# Patient Record
Sex: Female | Born: 1965
Health system: Southern US, Community
[De-identification: ages and names within clinical notes are randomized; demographics above are authoritative.]

## PROBLEM LIST (undated history)

## (undated) DIAGNOSIS — R109 Unspecified abdominal pain: Secondary | ICD-10-CM

## (undated) DIAGNOSIS — R32 Unspecified urinary incontinence: Secondary | ICD-10-CM

## (undated) DIAGNOSIS — R011 Cardiac murmur, unspecified: Secondary | ICD-10-CM

## (undated) DIAGNOSIS — M4800 Spinal stenosis, site unspecified: Secondary | ICD-10-CM

## (undated) HISTORY — PX: COLONOSCOPY: SHX174

## (undated) HISTORY — DX: Unspecified abdominal pain: R10.9

## (undated) HISTORY — DX: Spinal stenosis, site unspecified: M48.00

## (undated) HISTORY — PX: HEMORROIDECTOMY: SUR656

## (undated) HISTORY — DX: Cardiac murmur, unspecified: R01.1

## (undated) HISTORY — PX: ESOPHAGOGASTRODUODENOSCOPY: SHX1529

## (undated) HISTORY — DX: Unspecified urinary incontinence: R32

---

## 2015-07-11 ENCOUNTER — Ambulatory Visit: Payer: Self-pay | Attending: Internal Medicine

## 2015-07-27 ENCOUNTER — Ambulatory Visit (INDEPENDENT_AMBULATORY_CARE_PROVIDER_SITE_OTHER): Payer: Self-pay | Admitting: Family Medicine

## 2015-07-27 ENCOUNTER — Encounter: Payer: Self-pay | Admitting: Family Medicine

## 2015-07-27 VITALS — BP 123/55 | HR 76 | Temp 98.2°F | Resp 14 | Ht 60.0 in | Wt 122.0 lb

## 2015-07-27 DIAGNOSIS — R1031 Right lower quadrant pain: Secondary | ICD-10-CM

## 2015-07-27 DIAGNOSIS — Z1239 Encounter for other screening for malignant neoplasm of breast: Secondary | ICD-10-CM

## 2015-07-27 DIAGNOSIS — Z23 Encounter for immunization: Secondary | ICD-10-CM

## 2015-07-27 DIAGNOSIS — E040 Nontoxic diffuse goiter: Secondary | ICD-10-CM

## 2015-07-27 DIAGNOSIS — Z0000 Encounter for general adult medical examination without abnormal findings: Secondary | ICD-10-CM

## 2015-07-27 DIAGNOSIS — G8929 Other chronic pain: Secondary | ICD-10-CM | POA: Insufficient documentation

## 2015-07-27 DIAGNOSIS — E012 Iodine-deficiency related (endemic) goiter, unspecified: Secondary | ICD-10-CM

## 2015-07-27 LAB — COMPLETE METABOLIC PANEL WITH GFR
ALT: 19 U/L (ref 6–29)
AST: 24 U/L (ref 10–35)
Albumin: 4.3 g/dL (ref 3.6–5.1)
Alkaline Phosphatase: 96 U/L (ref 33–115)
BUN: 16 mg/dL (ref 7–25)
CO2: 25 mmol/L (ref 20–31)
Calcium: 9.4 mg/dL (ref 8.6–10.2)
Chloride: 102 mmol/L (ref 98–110)
Creat: 0.74 mg/dL (ref 0.50–1.10)
GFR, Est African American: >89 mL/min (ref >=60)
GFR, Est Non African American: >89 mL/min (ref >=60)
Glucose, Bld: 79 mg/dL (ref 65–99)
Potassium: 4.1 mmol/L (ref 3.5–5.3)
Sodium: 137 mmol/L (ref 135–146)
Total Bilirubin: 0.3 mg/dL (ref 0.2–1.2)
Total Protein: 8 g/dL (ref 6.1–8.1)

## 2015-07-27 LAB — CBC WITH DIFFERENTIAL/PLATELET
Basophils Absolute: 0 10*3/uL (ref 0.0–0.1)
Basophils Relative: 0 % (ref 0–1)
Eosinophils Absolute: 0.1 10*3/uL (ref 0.0–0.7)
Eosinophils Relative: 1 % (ref 0–5)
HCT: 41.4 % (ref 36.0–46.0)
Hemoglobin: 13.8 g/dL (ref 12.0–15.0)
Lymphocytes Relative: 28 % (ref 12–46)
Lymphs Abs: 1.6 10*3/uL (ref 0.7–4.0)
MCH: 28.9 pg (ref 26.0–34.0)
MCHC: 33.3 g/dL (ref 30.0–36.0)
MCV: 86.8 fL (ref 78.0–100.0)
MPV: 9.3 fL (ref 8.6–12.4)
Monocytes Absolute: 0.5 10*3/uL (ref 0.1–1.0)
Monocytes Relative: 8 % (ref 3–12)
Neutro Abs: 3.6 10*3/uL (ref 1.7–7.7)
Neutrophils Relative %: 63 % (ref 43–77)
Platelets: 296 10*3/uL (ref 150–400)
RBC: 4.77 MIL/uL (ref 3.87–5.11)
RDW: 13.3 % (ref 11.5–15.5)
WBC: 5.7 10*3/uL (ref 4.0–10.5)

## 2015-07-27 LAB — POCT URINALYSIS DIP (DEVICE)
Bilirubin Urine: NEGATIVE
Glucose, UA: NEGATIVE mg/dL
Ketones, ur: NEGATIVE mg/dL
Leukocytes, UA: NEGATIVE
Nitrite: NEGATIVE
Protein, ur: NEGATIVE mg/dL
Specific Gravity, Urine: 1.015 (ref 1.005–1.030)
Urobilinogen, UA: 0.2 mg/dL (ref 0.0–1.0)
pH: 6.5 (ref 5.0–8.0)

## 2015-07-27 LAB — LIPASE: Lipase: 59 U/L (ref 7–60)

## 2015-07-27 LAB — AMYLASE: Amylase: 77 U/L (ref 0–105)

## 2015-07-27 NOTE — Progress Notes (Signed)
Subjective:    Patient ID: Maria HooverHuyen Garrison, female    DOB: 08/13/1966, 49 y.o.   MRN: [id number]  HPI [name], a 49 year old female presents accompanied by an interpreter to establish care. She states that she moved from [place] 1 year ago. Patient primarily speaks vietnamese, interpreter is utilized to assist with communication. She has not had a primary provider since moving her. She is currently complaining of right upper abdominal pain for 2 weeks.  The pain is described as intermitent and is 4/10 in intensity. Symptoms have been intermittent since. Aggravating factors: include lying down.  Shje has not attempted any OTC interventions to alleviate symptoms. She denies fatigue, fever, early satiety, heartburn, belching, urinary problems, muscle aches, nausea, vomiting, and diarrhea  . History reviewed. No pertinent past medical history.   Social History   Social History  . Marital Status: Married    Spouse Name: N/A  . Number of Children: N/A  . Years of Education: N/A   Occupational History  . Not on file.   Social History Main Topics  . Smoking status: Never Smoker   . Smokeless tobacco: Not on file  . Alcohol Use: No  . Drug Use: No  . Sexual Activity: Not on file   Other Topics Concern  . Not on file   Social History Narrative  . No narrative on file   Immunization History  Administered Date(s) Administered  . Influenza,inj,Quad PF,36+ Mos 07/27/2015  . Tdap 07/27/2015    Review of Systems  Constitutional: Negative.  Negative for fever, fatigue and unexpected weight change.  HENT: Negative.   Eyes: Negative.  Negative for photophobia, redness and visual disturbance.  Cardiovascular: Negative for chest pain, palpitations and leg swelling.  Gastrointestinal: Positive for abdominal pain. Negative for nausea, vomiting, diarrhea, constipation, blood in stool, abdominal distention and rectal pain.       Patient reports abdominal pain and occasional soft stools   Endocrine: Negative.  Negative for polydipsia, polyphagia and polyuria.  Genitourinary: Negative.   Musculoskeletal: Negative.   Skin: Negative.   Allergic/Immunologic: Negative.  Negative for immunocompromised state.  Neurological: Negative.  Negative for dizziness, tremors, weakness and headaches.  Hematological: Negative.   Psychiatric/Behavioral: Negative.  Negative for suicidal ideas and sleep disturbance.       Objective:   Physical Exam  Constitutional: She is oriented to person, place, and time. She appears well-developed and well-nourished.  HENT:  Head: Normocephalic and atraumatic.  Right Ear: External ear normal.  Left Ear: External ear normal.  Nose: Nose normal.  Mouth/Throat: Oropharynx is clear and moist.  Eyes: Conjunctivae and EOM are normal. Pupils are equal, round, and reactive to light.  Neck: Normal range of motion. Neck supple.  Cardiovascular: Normal rate, regular rhythm, normal heart sounds and intact distal pulses.   Pulmonary/Chest: Effort normal and breath sounds normal.  Abdominal: Soft. Normal appearance and bowel sounds are normal. There is tenderness in the right upper quadrant and left upper quadrant.  Musculoskeletal: Normal range of motion.  Neurological: She is alert and oriented to person, place, and time.  Skin: Skin is warm and dry.  Psychiatric: She has a normal mood and affect. Her behavior is normal. Judgment and thought content normal.      BP 123/55 mmHg  Pulse 76  Temp(Src) 98.2 F (36.8 C) (Oral)  Resp 14  Ht 5' (1.524 m)  Wt 122 lb (55.339 kg)  BMI 23.83 kg/m2  LMP 07/11/2015 Assessment & Plan:  1. Abdominal pain, chronic, right lower quadrant Urinalysis negative. Will review labs as they become available. Recommended that patient follow a low fat diet and increase water intake.  - POCT urinalysis dipstick - COMPLETE METABOLIC PANEL WITH GFR - CBC with Differential - Lipase - Amylase  2. Need for immunization against  influenza - Flu Vaccine QUAD 36+ mos IM (Fluarix)  3. Need for Tdap vaccination  - Tdap vaccine greater than or equal to 7yo IM  4. Routine health maintenance Will schedule follow up for pap smear Will send referral for mammogram Recommend monthly self-breast exams as discussed - Multiple Vitamins-Minerals (MULTIVITAMIN WITH MINERALS) tablet; Take 1 tablet by mouth daily. 5. Goiter, simple Palpated over thyroid, fullness noted. Will check TSH.  - TSH  6. Screening for breast cancer * - MM Digital Screening; Future  RTC: as needed  Massie Maroon, FNP

## 2015-07-28 LAB — TSH: TSH: 1.258 u[IU]/mL (ref 0.350–4.500)

## 2015-07-28 NOTE — Patient Instructions (Signed)
  Recommend low fat clear liquid diet for 24 hours, then introduce bland soft diet. Abdominal Pain, Adult Many things can cause abdominal pain. Usually, abdominal pain is not caused by a disease and will improve without treatment. It can often be observed and treated at home. Your health care provider will do a physical exam and possibly order blood tests and X-rays to help determine the seriousness of your pain. However, in many cases, more time must pass before a clear cause of the pain can be found. Before that point, your health care provider may not know if you need more testing or further treatment. HOME CARE INSTRUCTIONS Monitor your abdominal pain for any changes. The following actions may help to alleviate any discomfort you are experiencing:  Only take over-the-counter or prescription medicines as directed by your health care provider.  Do not take laxatives unless directed to do so by your health care provider.  Try a clear liquid diet (broth, tea, or water) as directed by your health care provider. Slowly move to a bland diet as tolerated. SEEK MEDICAL CARE IF:  You have unexplained abdominal pain.  You have abdominal pain associated with nausea or diarrhea.  You have pain when you urinate or have a bowel movement.  You experience abdominal pain that wakes you in the night.  You have abdominal pain that is worsened or improved by eating food.  You have abdominal pain that is worsened with eating fatty foods.  You have a fever. SEEK IMMEDIATE MEDICAL CARE IF:  Your pain does not go away within 2 hours.  You keep throwing up (vomiting).  Your pain is felt only in portions of the abdomen, such as the right side or the left lower portion of the abdomen.  You pass bloody or black tarry stools. MAKE SURE YOU:  Understand these instructions.  Will watch your condition.  Will get help right away if you are not doing well or get worse.   This information is not intended  to replace advice given to you by your health care provider. Make sure you discuss any questions you have with your health care provider.   Document Released: 07/03/2005 Document Revised: 06/14/2015 Document Reviewed: 06/02/2013 Elsevier Interactive Patient Education [employer]2016 Elsevier Inc.

## 2015-09-21 ENCOUNTER — Ambulatory Visit: Payer: Self-pay | Admitting: Family Medicine

## 2015-10-04 ENCOUNTER — Other Ambulatory Visit (HOSPITAL_COMMUNITY)
Admission: RE | Admit: 2015-10-04 | Discharge: 2015-10-04 | Disposition: A | Discharge status: Home / Self Care | Payer: Self-pay | Source: Ambulatory Visit | Attending: Internal Medicine | Admitting: Internal Medicine

## 2015-10-04 ENCOUNTER — Encounter: Payer: Self-pay | Admitting: Family Medicine

## 2015-10-04 ENCOUNTER — Ambulatory Visit (INDEPENDENT_AMBULATORY_CARE_PROVIDER_SITE_OTHER): Payer: Self-pay | Admitting: Family Medicine

## 2015-10-04 VITALS — BP 109/67 | HR 72 | Temp 98.1°F | Resp 14 | Ht 60.0 in | Wt 122.0 lb

## 2015-10-04 DIAGNOSIS — N941 Unspecified dyspareunia: Secondary | ICD-10-CM | POA: Insufficient documentation

## 2015-10-04 DIAGNOSIS — Z789 Other specified health status: Secondary | ICD-10-CM

## 2015-10-04 DIAGNOSIS — R102 Pelvic and perineal pain: Secondary | ICD-10-CM

## 2015-10-04 DIAGNOSIS — G8929 Other chronic pain: Secondary | ICD-10-CM

## 2015-10-04 DIAGNOSIS — R1031 Right lower quadrant pain: Secondary | ICD-10-CM

## 2015-10-04 DIAGNOSIS — Z01419 Encounter for gynecological examination (general) (routine) without abnormal findings: Secondary | ICD-10-CM | POA: Insufficient documentation

## 2015-10-04 LAB — POCT URINALYSIS DIP (DEVICE)
Bilirubin Urine: NEGATIVE
Glucose, UA: NEGATIVE mg/dL
Ketones, ur: NEGATIVE mg/dL
Leukocytes, UA: NEGATIVE
Nitrite: NEGATIVE
Protein, ur: NEGATIVE mg/dL
Specific Gravity, Urine: 1.02 (ref 1.005–1.030)
Urobilinogen, UA: 0.2 mg/dL (ref 0.0–1.0)
pH: 7 (ref 5.0–8.0)

## 2015-10-04 NOTE — Patient Instructions (Addendum)
Will schedule pelvic/transvaginal ultrasound. Also, will follow up by phone when results are available.   Pelvic Pain, Female Female pelvic pain can be caused by many different things and start from a variety of places. Pelvic pain refers to pain that is located in the lower half of the abdomen and between your hips. The pain may occur over a short period of time (acute) or may be reoccurring (chronic). The cause of pelvic pain may be related to disorders affecting the female reproductive organs (gynecologic), but it may also be related to the bladder, kidney stones, an intestinal complication, or muscle or skeletal problems. Getting help right away for pelvic pain is important, especially if there has been severe, sharp, or a sudden onset of unusual pain. It is also important to get help right away because some types of pelvic pain can be life threatening.  CAUSES  Below are only some of the causes of pelvic pain. The causes of pelvic pain can be in one of several categories.   Gynecologic.  Pelvic inflammatory disease.  Sexually transmitted infection.  Ovarian cyst or a twisted ovarian ligament (ovarian torsion).  Uterine lining that grows outside the uterus (endometriosis).  Fibroids, cysts, or tumors.  Ovulation.  Pregnancy.  Pregnancy that occurs outside the uterus (ectopic pregnancy).  Miscarriage.  Labor.  Abruption of the placenta or ruptured uterus.  Infection.  Uterine infection (endometritis).  Bladder infection.  Diverticulitis.  Miscarriage related to a uterine infection (septic abortion).  Bladder.  Inflammation of the bladder (cystitis).  Kidney stone(s).  Gastrointestinal.  Constipation.  Diverticulitis.  Neurologic.  Trauma.  Feeling pelvic pain because of mental or emotional causes (psychosomatic).  Cancers of the bowel or pelvis. EVALUATION  Your caregiver will want to take a careful history of your concerns. This includes recent changes  in your health, a careful gynecologic history of your periods (menses), and a sexual history. Obtaining your family history and medical history is also important. Your caregiver may suggest a pelvic exam. A pelvic exam will help identify the location and severity of the pain. It also helps in the evaluation of which organ system may be involved. In order to identify the cause of the pelvic pain and be properly treated, your caregiver may order tests. These tests may include:   A pregnancy test.  Pelvic ultrasonography.  An X-ray exam of the abdomen.  A urinalysis or evaluation of vaginal discharge.  Blood tests. HOME CARE INSTRUCTIONS   Only take over-the-counter or prescription medicines for pain, discomfort, or fever as directed by your caregiver.   Rest as directed by your caregiver.   Eat a balanced diet.   Drink enough fluids to make your urine clear or pale yellow, or as directed.   Avoid sexual intercourse if it causes pain.   Apply warm or cold compresses to the lower abdomen depending on which one helps the pain.   Avoid stressful situations.   Keep a journal of your pelvic pain. Write down when it started, where the pain is located, and if there are things that seem to be associated with the pain, such as food or your menstrual cycle.  Follow up with your caregiver as directed.  SEEK MEDICAL CARE IF:  Your medicine does not help your pain.  You have abnormal vaginal discharge. SEEK IMMEDIATE MEDICAL CARE IF:   You have heavy bleeding from the vagina.   Your pelvic pain increases.   You feel light-headed or faint.   You have chills.  You have pain with urination or blood in your urine.   You have uncontrolled diarrhea or vomiting.   You have a fever or persistent symptoms for more than 3 days.  You have a fever and your symptoms suddenly get worse.   You are being physically or sexually abused.   This information is not intended to  replace advice given to you by your health care provider. Make sure you discuss any questions you have with your health care provider.   Document Released: 08/20/2004 Document Revised: 06/14/2015 Document Reviewed: 01/13/2012 Elsevier Interactive Patient Education [employer]

## 2015-10-04 NOTE — Progress Notes (Signed)
Subjective:    Patient ID: Maria Garrison, female    DOB: 08/13/1966, 49 y.o.   MRN: [id number]  HPI [name], a 49 year old female presents accompanied by [place] interpreter with a complaint of abdominal pain. She states that abdominal pain has been occuring intermittently over the past several years. She states that she has been having pelvic discomfort with sexual intercourse. She denies fever, fatigue, abnormal vaginal bleeding, vaginal discharge, constipation, nausea, vomiting, or diarrhea. Maria Garrison states that her last pap smear was in [place] many years ago. She has 2 children, both vaginal births. She denies a history of sexually transmitted diseases.  Past Medical History  Diagnosis Date  . Abdominal pain in female     For 3-4 years    Social History   Social History  . Marital Status: Married    Spouse Name: N/A  . Number of Children: N/A  . Years of Education: N/A   Occupational History  . Not on file.   Social History Main Topics  . Smoking status: Never Smoker   . Smokeless tobacco: Not on file  . Alcohol Use: No  . Drug Use: No  . Sexual Activity: Not on file   Other Topics Concern  . Not on file   Social History Narrative   Review of Systems  Constitutional: Negative.   HENT: Negative.   Eyes: Negative.   Respiratory: Negative.   Cardiovascular: Negative.   Gastrointestinal: Positive for abdominal pain.  Endocrine: Negative.  Negative for polydipsia and polyphagia.  Genitourinary: Positive for flank pain, pelvic pain and dyspareunia. Negative for hematuria, decreased urine volume, vaginal bleeding and vaginal pain.  Musculoskeletal: Negative.   Skin: Negative.   Allergic/Immunologic: Negative.  Negative for immunocompromised state.  Neurological: Negative.  Negative for headaches.  Hematological: Negative.   Psychiatric/Behavioral: Negative.        Objective:   Physical Exam  Constitutional: She is oriented to person, place, and time. She  appears well-developed and well-nourished.  HENT:  Head: Normocephalic and atraumatic.  Right Ear: External ear normal.  Left Ear: External ear normal.  Mouth/Throat: Oropharynx is clear and moist.  Eyes: Conjunctivae and EOM are normal. Pupils are equal, round, and reactive to light.  Neck: Normal range of motion. Neck supple.  Cardiovascular: Normal rate, regular rhythm, normal heart sounds and intact distal pulses.   Pulmonary/Chest: Effort normal and breath sounds normal.  Abdominal: Soft. Bowel sounds are normal. There is tenderness.  Genitourinary: Uterus is deviated. Uterus is not tender. Cervix exhibits discharge and friability. Right adnexum displays no tenderness. Left adnexum displays no tenderness. Vaginal discharge found.    Neurological: She is alert and oriented to person, place, and time. She has normal reflexes.  Skin: Skin is warm and dry.  Psychiatric: She has a normal mood and affect. Her behavior is normal. Judgment and thought content normal.     BP 109/67 mmHg  Pulse 72  Temp(Src) 98.1 F (36.7 C) (Oral)  Resp 14  Ht 5' (1.524 m)  Wt 122 lb (55.339 kg)  BMI 23.83 kg/m2 Assessment & Plan:  1. Pelvic pain in female Patient is complaining of pelvic pain, primary following sexual intercourse. Mild CMT on vaginal examination. Will send for pelvic/transvaginal ultrasound.  - US Pelvis Complete; Future - US Transvaginal Non-OB; Future  2. Dyspareunia in female - POCT urinalysis dip (device) - US Pelvis Complete; Future - US Transvaginal Non-OB; Future  3. Abdominal pain, chronic, right lower quadrant Reviewed previous laboratory  values, within normal limits. Urinalysis unremarkable.  - POCT urinalysis dip (device)  4. Encounter for routine gynecological examination Normal rugae, cervix friable, mild CVA tenderness - Cytology - PAP [place]  5. Language barrier to communication Utilized interpreter to assist with communication   RTC: Will follow up  by phone as results become available. Will schedule follow up appointment as needed.    The patient was given clear instructions to go to ER or return to medical center if symptoms do not improve, worsen or new problems develop. The patient verbalized understanding. Will notify patient with laboratory results.   Massie MaroonHollis,Lachina M, FNP

## 2015-10-06 LAB — CYTOLOGY - PAP

## 2015-10-11 ENCOUNTER — Ambulatory Visit (HOSPITAL_COMMUNITY): Admission: RE | Admit: 2015-10-11 | Payer: Self-pay | Source: Ambulatory Visit

## 2015-10-18 ENCOUNTER — Ambulatory Visit (HOSPITAL_COMMUNITY)
Admission: RE | Admit: 2015-10-18 | Discharge: 2015-10-18 | Disposition: A | Discharge status: Home / Self Care | Payer: Self-pay | Source: Ambulatory Visit | Attending: Family Medicine | Admitting: Family Medicine

## 2015-10-18 ENCOUNTER — Ambulatory Visit (HOSPITAL_COMMUNITY): Payer: Self-pay

## 2015-10-18 DIAGNOSIS — N941 Unspecified dyspareunia: Secondary | ICD-10-CM | POA: Insufficient documentation

## 2015-10-18 DIAGNOSIS — R102 Pelvic and perineal pain: Secondary | ICD-10-CM | POA: Insufficient documentation

## 2015-10-20 ENCOUNTER — Other Ambulatory Visit: Payer: Self-pay | Admitting: Family Medicine

## 2015-10-20 DIAGNOSIS — N941 Unspecified dyspareunia: Secondary | ICD-10-CM

## 2015-10-20 DIAGNOSIS — R102 Pelvic and perineal pain: Secondary | ICD-10-CM

## 2015-10-20 NOTE — Progress Notes (Signed)
Called and left message for patient to return call. Thanks.

## 2015-10-20 NOTE — Progress Notes (Signed)
Reviewed pelvic/transvaginal ultrasound: Probable right corpus luteum cyst. Patient is complaining of infertility and dyspareunia as well. Will send a referral to gynecology for further evaluation.    Massie MaroonHollis,Aizen Duval M, FNP

## 2015-10-23 NOTE — Progress Notes (Signed)
Used interpreter services ID [id number] to call patient. Husband answered and states he is at work and wife is at home and asked for us to call back later. Will try in the afternoon.  Thanks!

## 2015-10-24 ENCOUNTER — Other Ambulatory Visit: Payer: Self-pay | Admitting: Family Medicine

## 2015-10-24 DIAGNOSIS — R102 Pelvic and perineal pain: Secondary | ICD-10-CM

## 2015-10-24 DIAGNOSIS — N941 Unspecified dyspareunia: Secondary | ICD-10-CM

## 2015-10-24 NOTE — Progress Notes (Signed)
Have been unable to speak to patient after several attempts, I will send letter in mail regarding lab results and that patient will be sent to gynecology. Thanks!

## 2015-12-11 ENCOUNTER — Encounter: Payer: Self-pay | Admitting: Obstetrics & Gynecology

## 2015-12-11 ENCOUNTER — Ambulatory Visit (INDEPENDENT_AMBULATORY_CARE_PROVIDER_SITE_OTHER): Payer: Self-pay | Admitting: Obstetrics & Gynecology

## 2015-12-11 VITALS — BP 121/73 | HR 79 | Temp 98.4°F | Ht 61.0 in | Wt 125.1 lb

## 2015-12-11 DIAGNOSIS — Z113 Encounter for screening for infections with a predominantly sexual mode of transmission: Secondary | ICD-10-CM

## 2015-12-11 DIAGNOSIS — R102 Pelvic and perineal pain: Secondary | ICD-10-CM

## 2015-12-11 DIAGNOSIS — N941 Unspecified dyspareunia: Secondary | ICD-10-CM

## 2015-12-11 LAB — POCT URINALYSIS DIP (DEVICE)
Bilirubin Urine: NEGATIVE
Bilirubin Urine: NEGATIVE
Glucose, UA: NEGATIVE mg/dL
Glucose, UA: NEGATIVE mg/dL
Ketones, ur: NEGATIVE mg/dL
Ketones, ur: NEGATIVE mg/dL
Leukocytes, UA: NEGATIVE
Leukocytes, UA: NEGATIVE
Nitrite: NEGATIVE
Nitrite: NEGATIVE
Protein, ur: NEGATIVE mg/dL
Protein, ur: NEGATIVE mg/dL
Specific Gravity, Urine: <=1.005 (ref 1.005–1.030)
Specific Gravity, Urine: 1.01 (ref 1.005–1.030)
Urobilinogen, UA: 0.2 mg/dL (ref 0.0–1.0)
Urobilinogen, UA: 0.2 mg/dL (ref 0.0–1.0)
pH: 5 (ref 5.0–8.0)
pH: 6 (ref 5.0–8.0)

## 2015-12-11 NOTE — Progress Notes (Signed)
Patient ID: Maria Garrison, female   DOB: [name], 50 y.o.   MRN: [id number]  Chief Complaint  Patient presents with  . Pelvic Pain  for several years  HPI Maria Garrison is a 50 y.o. female.  [id number] Patient's last menstrual period was 11/19/2015. Regular menses, normal per patient. Referred by FP. Prominent urinary symptoms and dyspareunia, LLQ pain and SP pain.  HPI  Past Medical History  Diagnosis Date  . Abdominal pain in female     For 3-4 years    Past Surgical History  Procedure Laterality Date  . Hemorroidectomy      Family History  Problem Relation Age of Onset  . Family history unknown: Yes    Social History Social History  Substance Use Topics  . Smoking status: Never Smoker   . Smokeless tobacco: None  . Alcohol Use: No    No Known Allergies  Current Outpatient Prescriptions  Medication Sig Dispense Refill  . Multiple Vitamins-Minerals (MULTIVITAMIN WITH MINERALS) tablet Take 1 tablet by mouth daily.     No current facility-administered medications for this visit.    Review of Systems Review of Systems  Constitutional: Negative.   Respiratory: Negative.   Gastrointestinal: Positive for abdominal pain.  Genitourinary: Positive for urgency, frequency, vaginal discharge, pelvic pain and dyspareunia. Negative for flank pain and vaginal bleeding.    Blood pressure 121/73, pulse 79, temperature 98.4 F (36.9 C), temperature source Oral, height 5\' 1"  (1.549 m), weight 125 lb 1.6 oz (56.745 kg), last menstrual period 11/19/2015.  Physical Exam Physical Exam  Constitutional: She is oriented to person, place, and time. She appears well-developed. No distress.  Pulmonary/Chest: Effort normal.  Genitourinary: Vagina normal. No vaginal discharge found.  No mass, mild tenderness mostly s/p  Neurological: She is alert and oriented to person, place, and time.  Psychiatric: She has a normal mood and affect. Her behavior is normal.    Data  Reviewed urinalysis  Assessment    Patient Active Problem List   Diagnosis Date Noted  . Pelvic pain in female 10/04/2015  . Dyspareunia in female 10/04/2015  . Encounter for routine gynecological examination 10/04/2015  . Abdominal pain, chronic, right lower quadrant 07/27/2015   Suspect interstitial cystitis    Plan    Urine culture STD probe result Urology consult        Maria Garrison 12/11/2015, 2:34 PM

## 2015-12-11 NOTE — Progress Notes (Signed)
Language Resources Lottie MusselKim O'Leary  [facility] appt scheduled for April 10th @ 1000.  Pt notified to bring $250 to the appt.

## 2015-12-11 NOTE — Patient Instructions (Signed)

## 2015-12-12 LAB — GC/CHLAMYDIA PROBE AMP (~~LOC~~) NOT AT ARMC
Chlamydia: NEGATIVE
Neisseria Gonorrhea: NEGATIVE

## 2015-12-13 LAB — URINE CULTURE: Colony Count: 3000

## 2016-01-15 ENCOUNTER — Ambulatory Visit: Payer: Self-pay | Attending: Internal Medicine

## 2016-02-19 ENCOUNTER — Ambulatory Visit: Payer: Self-pay | Attending: Internal Medicine

## 2017-02-06 ENCOUNTER — Ambulatory Visit (INDEPENDENT_AMBULATORY_CARE_PROVIDER_SITE_OTHER): Payer: Self-pay | Admitting: Physician Assistant

## 2017-02-21 ENCOUNTER — Ambulatory Visit (INDEPENDENT_AMBULATORY_CARE_PROVIDER_SITE_OTHER): Payer: Self-pay | Admitting: Physician Assistant

## 2017-03-13 ENCOUNTER — Other Ambulatory Visit (HOSPITAL_COMMUNITY)
Admission: RE | Admit: 2017-03-13 | Discharge: 2017-03-13 | Disposition: A | Discharge status: Home / Self Care | Payer: Self-pay | Source: Ambulatory Visit | Attending: Physician Assistant | Admitting: Physician Assistant

## 2017-03-13 ENCOUNTER — Ambulatory Visit (INDEPENDENT_AMBULATORY_CARE_PROVIDER_SITE_OTHER): Payer: Self-pay | Admitting: Physician Assistant

## 2017-03-13 ENCOUNTER — Encounter (INDEPENDENT_AMBULATORY_CARE_PROVIDER_SITE_OTHER): Payer: Self-pay | Admitting: Physician Assistant

## 2017-03-13 VITALS — BP 110/68 | HR 63 | Temp 98.3°F | Ht 60.5 in | Wt 117.6 lb

## 2017-03-13 DIAGNOSIS — N941 Unspecified dyspareunia: Secondary | ICD-10-CM

## 2017-03-13 DIAGNOSIS — K5900 Constipation, unspecified: Secondary | ICD-10-CM

## 2017-03-13 DIAGNOSIS — N390 Urinary tract infection, site not specified: Secondary | ICD-10-CM | POA: Insufficient documentation

## 2017-03-13 DIAGNOSIS — R1032 Left lower quadrant pain: Secondary | ICD-10-CM

## 2017-03-13 DIAGNOSIS — R1011 Right upper quadrant pain: Secondary | ICD-10-CM

## 2017-03-13 DIAGNOSIS — R8281 Pyuria: Secondary | ICD-10-CM

## 2017-03-13 LAB — POCT URINALYSIS DIPSTICK
Bilirubin, UA: NEGATIVE
Glucose, UA: NEGATIVE
Ketones, UA: NEGATIVE
Leukocytes, UA: NEGATIVE
Nitrite, UA: NEGATIVE
Protein, UA: NEGATIVE
Spec Grav, UA: 1.015 (ref 1.010–1.025)
Urobilinogen, UA: 0.2 E.U./dL
pH, UA: 7.5 (ref 5.0–8.0)

## 2017-03-13 LAB — POCT URINE PREGNANCY: Preg Test, Ur: NEGATIVE

## 2017-03-13 MED ORDER — SENNOSIDES-DOCUSATE SODIUM 8.6-50 MG PO TABS: 1.0000 | ORAL_TABLET | Freq: Every day | ORAL | 0 refills | Status: DC

## 2017-03-13 MED ORDER — CIPROFLOXACIN HCL 500 MG PO TABS: 500.0000 mg | ORAL_TABLET | Freq: Two times a day (BID) | ORAL | 0 refills | Status: AC

## 2017-03-13 MED FILL — CIPROFLOXACIN HCL 500 MG TA: 500 | 5 days supply | Qty: 6 | Fill #0

## 2017-03-13 NOTE — Progress Notes (Signed)
Subjective:  Patient ID: Maria HooverHuyen Randon, female    DOB: Jul 04, 1966  Age: 51 y.o. MRN: [id number]  CC: dysuria  HPI Zahniya Mennella is a 51 y.o. female with no significant PMH presents with LLQ pain, RUQ pain, blood in stool/tissue, and constipation. Thinks she has hemorrhoid. Has remote hx of hemorrhoid surgery in [place]. States he has dyspareunia, metrorrhagia. Denies menorrhagia, vaginal discharge, or genital lesions. Does not endorse CP, palpitations, SOB, HA, fatigue, rash, dysuria, urinary frequency, back pain, f/c/n/v.       ROS Review of Systems  Constitutional: Negative for chills, fever and malaise/fatigue.  Eyes: Negative for blurred vision.  Respiratory: Negative for shortness of breath.   Cardiovascular: Negative for chest pain and palpitations.  Gastrointestinal: Positive for abdominal pain, blood in stool and constipation. Negative for diarrhea, nausea and vomiting.  Genitourinary: Positive for flank pain. Negative for dysuria, frequency and hematuria.       Dyspareunia  Musculoskeletal: Negative for joint pain and myalgias.  Skin: Negative for rash.  Neurological: Negative for tingling and headaches.  Psychiatric/Behavioral: Negative for depression. The patient is not nervous/anxious.     Objective:  BP 110/68 (BP Location: Right Arm, Patient Position: Sitting, Cuff Size: Normal)   Pulse 63   Temp 98.3 F (36.8 C) (Oral)   Ht 5' 0.5" (1.537 m)   Wt 117 lb 9.6 oz (53.3 kg)   LMP 03/10/2017 (Approximate)   SpO2 99%   BMI 22.59 kg/m   BP/Weight 03/13/2017 12/11/2015 10/04/2015  Systolic BP 110 121 109  Diastolic BP 68 73 67  Wt. (Lbs) 117.6 125.1 122  BMI 22.59 23.65 23.83      Physical Exam  Constitutional: She is oriented to person, place, and time.  Well developed, well nourished, NAD, polite  HENT:  Head: Normocephalic and atraumatic.  Eyes: No scleral icterus.  Neck: Normal range of motion. Neck supple. No thyromegaly present.  Cardiovascular: Normal rate,  regular rhythm and normal heart sounds.   Pulmonary/Chest: Effort normal and breath sounds normal.  Abdominal: Soft. Bowel sounds are normal. There is no tenderness.  Genitourinary:  Genitourinary Comments: Vulva normal, vagina shallow, small nabothian cyst on cervix, no cervical motion tenderness, no adnexal masses or tenderness. Mild uterine TTP on left side with mild enlargement on left side.  Musculoskeletal: She exhibits no edema.  Neurological: She is alert and oriented to person, place, and time.  Skin: Skin is warm and dry. No rash noted. No erythema. No pallor.  Psychiatric: She has a normal mood and affect. Her behavior is normal. Thought content normal.  Vitals reviewed.    Assessment & Plan:   1. Left lower quadrant pain - Urinalysis Dipstick with trace leukocyte in clinic today - POCT urine pregnancy negative    2. RUQ pain - CMP - CBC  3. Constipation, unspecified constipation type - Begin senna-docusate (SENOKOT-S) 8.6-50 MG tablet; Take 1 tablet by mouth at bedtime.  Dispense: 30 tablet; Refill: 0  3. Pyuria - Urine cx - Begin Cipro 500 mg one tab BID x3 days.  4. Dyspareunia - Cytology - PAP [place] - US pelvic and transvaginal    Meds ordered this encounter  Medications  . senna-docusate (SENOKOT-S) 8.6-50 MG tablet    Sig: Take 1 tablet by mouth at bedtime.    Dispense:  30 tablet    Refill:  0    Order Specific Question:   Supervising Provider    Answer:   Quentin AngstJEGEDE, OLUGBEMIGA E [id number]    Follow-up:  Return in about 4 weeks (around 04/10/2017) for LLQ pain.   Loletta Specter PA

## 2017-03-13 NOTE — Patient Instructions (Addendum)
To bn, Ng??i l?n (Constipation, Adult) To bn l khi m?t ng??i ?i ??i ti?n trong m?t tu?n t h?n so v?i bnh th??ng, ??i ti?n kh kh?n, ho?c ??i ti?n ra phn kh, c?ng, ho?c to h?n bnh th??ng. To bn c th? do m?t tnh tr?ng bn trong gy ra. N c th? tr? ln tr?m tr?ng h?n theo ?? tu?i n?u m?t ng??i dng cc lo?i thu?c nh?t ??nh v khng u?ng ?? n??c. H??NG D?N CH?M [place] T?I [place] ?n v u?ng  ?n th?c ?n c nhi?u ch?t x? nh? tri cy t??i v rau, ng? c?c nguyn h?t v cc lo?i ??u.  H?n ch? th?c ?n c nhi?u ch?t bo, t ch?t x?, ho?c ch? bi?n s?n qu m?c, ch?ng h?n khoai ty chin, bnh hamburger, bnh quy, k?o v soda.  U?ng ?? n??c ?? gi? cho n??c ti?u trong ho?c c mu vng nh?t. H??ng d?n chung  T?p th? d?c th??ng xuyn theo ch? d?n c?a chuyn gia ch?m [place] s?c kh?e.  ?i v? sinh ngay khi qu v? c nhu c?u. Khng nh?n ?i ??i ti?n.  Ch? s? d?ng thu?c khng c?n k ??n v thu?c c?n k ??n theo ch? d?n c?a chuyn gia ch?m [place] s?c kh?e. Cc thu?c ny bao g?m b?t k? th?c ph?m ch?c n?ng c ch?t x? no.  Th?c hnh cc bi t?p luy?n l?i c? ?y ch?u, ch?ng h?n nh? th? su trong lc th? gin b?ng d??i v th? gin ph?n ?y ch?u trong khi ??i ti?n.  Theo di tnh tr?ng c?a qu v? ?? pht hi?n b?t k? thay ??i no.  Tun th? t?t c? cc cu?c h?n khm l?i theo ch? d?n c?a chuyn gia ch?m [place] s?c kh?e. ?i?u ny c vai tr quan tr?ng. ?I [facility] N?U:  Qu v? b? ?au va? ?au tr?m tr?ng h?n.  Qu v? b? s?t.  Qu v? khng ?i ??i ti?n sau 4 ngy.  Qu v? nn.  Qu v? khng ?i.  Qu v? b? s?t cn.  Qu v? b? ch?y mu ? h?u mn.  Phn c?a qu v? m?ng, trng nh? bt ch.  NGAY L?P T?C ?I K[facility] N?U:  Qu v? b? s?t v cc tri?u ch?ng c?a qu v? ??t nhin tr?m tr?ng h?n.  Qu v? sn phn ho?c c mu trong phn.  B?ng c?a qu v? b? ch??ng ln.  Qu v? b? ?au r?t nhi?u ? b?ng.  Qu v? c?m th?y chng m?t ho?c ng?t x?u.  Thng tin ny khng nh?m m?c ?ch thay th? cho l?i khuyn m chuyn gia  ch?m  s?c kh?e ni v?i qu v?. Hy b?o ??m qu v? ph?i th?o lu?n b?t k? v?n ?? g m qu v? c v?i chuyn gia ch?m  s?c kh?e c?a qu v?. Document Released: 01/08/2011 Document Revised: 10/14/2014 Document Reviewed: 03/13/2016 Elsevier Interactive Patient Education  2017 Elsevier Inc.  Dyspareunia, Female Dyspareunia is pain that is associated with sexual activity. This can affect any part of the genitals or lower abdomen, and there are many possible causes. This condition ranges from mild to severe. Depending on the cause, dyspareunia may get better with treatment, or it may return (recur) over time. What are the causes? The cause of this condition is not always known. Possible causes include:  Cancer.  Psychological factors, such as depression, anxiety, or previous traumatic experiences.  Severe pain and tenderness of the skin around the vagina (vulva) when it is touched (vulvar vestibulitis syndrome).  Infection of the pelvis  or the vulva.  Infection of the vagina.  Painful, involuntary tightening (contraction) of the vaginal muscles when anything is put inside the vagina (vaginismus).  Allergic reaction.  Ovarian cysts.  Solid growths of tissue (tumors) in the ovaries or the uterus.  Scar tissue in the ovaries, vagina, or pelvis.  Vaginal dryness.  Thinning of the tissue (atrophy) of the vulva and vagina.  Skin conditions that affect the vulva (vulvar dermatoses), such as lichen sclerosus or lichen planus.  Endometriosis.  Tubal pregnancy.  A tilted uterus.  Uterine prolapse.  Adhesions in the vagina.  Bladder problems.  Intestinal problems.  Certain medicines.  Medical conditions such as diabetes, arthritis, or thyroid disease.  What increases the risk? The following factors may make you more likely to develop this condition:  Having experienced physical or sexual trauma.  Having given birth more than once.  Taking birth control pills.  Having  gone through menopause.  Having recently given birth, typically within the past 3-6 months.  Breastfeeding.  What are the signs or symptoms? The main symptom of this condition is pain in any part of the genitals or lower abdomen during or after sexual activity. This may include pain during sexual arousal, genital stimulation, or orgasm. Pain may get worse when anything is inserted into the vagina, or when the genitals are touched in any way, such as when sitting or wearing pants. Pain can range from mild to severe, depending on the cause of the condition. In some cases, symptoms go away with treatment and return (recur) at a later date. How is this diagnosed? This condition may be diagnosed based on:  Your symptoms, including: ? Where your pain is located. ? When your pain occurs.  Your medical history.  A physical exam. This may include a pelvic exam and a Pap test. This is a screening test that is used to check for signs of cancer of the vagina, cervix, and uterus.  Tests, including: ? Blood tests. ? Ultrasound. This uses sound waves to make a picture of the area that is being tested. ? Urine culture. This test involves checking a urine sample for signs of infection. ? Culture test. This is when your health care provider uses a swab to collect a sample of vaginal fluid. The sample is checked for signs of infection. ? X-rays. ? MRI. ? CT scan. ? Laparoscopy. This is a procedure in which a small incision is made in your lower abdomen and a lighted, pencil-sized instrument (laparoscope) is passed through the incision and used to look inside your pelvis.  You may be referred to a health care provider who specializes in women's health (gynecologist). In some cases, diagnosing the cause of dyspareunia can be difficult. How is this treated? Treatment depends on the cause of your condition and your symptoms. In most cases, you may need to stop sexual activity until your symptoms improve.  Treatment may include:  Lubricants.  Kegel exercises or vaginal dilators.  Medicated skin creams.  Medicated vaginal creams.  Hormonal therapy.  Antibiotic medicine to prevent or fight infection.  Medicines that help to relieve pain.  Medicines that treat depression (antidepressants).  Psychological counseling.  Sex therapy.  Surgery.  Follow these instructions at home: Lifestyle  Avoid tight clothing and irritating materials around your genital and abdominal area.  Use water-based lubricants as needed. Avoid oil-based lubricants.  Do not use any products that irritate you. This may include certain condoms, spermicides, lubricants, soaps, tampons, vaginal sprays, or douches.  Always practice safe sex. Talk with your health care provider about which form of birth control (contraception) is best for you.  Maintain open communication with your sexual partner. General instructions  Take over-the-counter and prescription medicines only as told by your health care provider.  If you had tests done, it is your responsibility to get your tests results. Ask your health care provider or the department performing the test when your results will be ready.  Urinate before you engage in sexual activity.  Consider joining a support group.  Keep all follow-up visits as told by your health care provider. This is important. Contact a health care provider if:  You develop vaginal bleeding after sexual intercourse.  You develop a lump at the opening of your vagina. Seek medical care even if the lump is painless.  You have: ? Abnormal vaginal discharge. ? Vaginal dryness. ? Itchiness or irritation of your vulva or vagina. ? A new rash. ? Symptoms that get worse or do not improve with treatment. ? A fever. ? Pain when you urinate. ? Blood in your urine. Get help right away if:  You develop severe pain in your abdomen during or shortly after sexual intercourse.  You pass out  after having sexual intercourse. This information is not intended to replace advice given to you by your health care provider. Make sure you discuss any questions you have with your health care provider. Document Released: 10/13/2007 Document Revised: 02/02/2016 Document Reviewed: 04/25/2015 Elsevier Interactive Patient Education  [employer].

## 2017-03-14 LAB — COMPREHENSIVE METABOLIC PANEL
ALT: 13 IU/L (ref 0–32)
AST: 23 IU/L (ref 0–40)
Albumin/Globulin Ratio: 1.5 (ref 1.2–2.2)
Albumin: 4.5 g/dL (ref 3.5–5.5)
Alkaline Phosphatase: 86 IU/L (ref 39–117)
BUN/Creatinine Ratio: 22 (ref 9–23)
BUN: 15 mg/dL (ref 6–24)
Bilirubin Total: 0.3 mg/dL (ref 0.0–1.2)
CO2: 24 mmol/L (ref 18–29)
Calcium: 9.2 mg/dL (ref 8.7–10.2)
Chloride: 101 mmol/L (ref 96–106)
Creatinine, Ser: 0.68 mg/dL (ref 0.57–1.00)
GFR calc Af Amer: 120 mL/min/{1.73_m2} (ref >59)
GFR calc non Af Amer: 104 mL/min/{1.73_m2} (ref >59)
Globulin, Total: 3 g/dL (ref 1.5–4.5)
Glucose: 82 mg/dL (ref 65–99)
Potassium: 4 mmol/L (ref 3.5–5.2)
Sodium: 139 mmol/L (ref 134–144)
Total Protein: 7.5 g/dL (ref 6.0–8.5)

## 2017-03-14 LAB — CBC WITH DIFFERENTIAL/PLATELET
Basophils Absolute: 0 10*3/uL (ref 0.0–0.2)
Basos: 1 %
EOS (ABSOLUTE): 0.1 10*3/uL (ref 0.0–0.4)
Eos: 1 %
Hematocrit: 41.5 % (ref 34.0–46.6)
Hemoglobin: 13.2 g/dL (ref 11.1–15.9)
Immature Grans (Abs): 0 10*3/uL (ref 0.0–0.1)
Immature Granulocytes: 0 %
Lymphocytes Absolute: 1.6 10*3/uL (ref 0.7–3.1)
Lymphs: 30 %
MCH: 28.3 pg (ref 26.6–33.0)
MCHC: 31.8 g/dL (ref 31.5–35.7)
MCV: 89 fL (ref 79–97)
Monocytes Absolute: 0.4 10*3/uL (ref 0.1–0.9)
Monocytes: 8 %
Neutrophils Absolute: 3.1 10*3/uL (ref 1.4–7.0)
Neutrophils: 60 %
Platelets: 282 10*3/uL (ref 150–379)
RBC: 4.67 x10E6/uL (ref 3.77–5.28)
RDW: 13.7 % (ref 12.3–15.4)
WBC: 5.2 10*3/uL (ref 3.4–10.8)

## 2017-03-15 LAB — URINE CULTURE

## 2017-03-17 ENCOUNTER — Other Ambulatory Visit (INDEPENDENT_AMBULATORY_CARE_PROVIDER_SITE_OTHER): Payer: Self-pay | Admitting: Physician Assistant

## 2017-03-17 ENCOUNTER — Ambulatory Visit (HOSPITAL_COMMUNITY)
Admission: RE | Admit: 2017-03-17 | Discharge: 2017-03-17 | Disposition: A | Discharge status: Home / Self Care | Payer: Self-pay | Source: Ambulatory Visit | Attending: Physician Assistant | Admitting: Physician Assistant

## 2017-03-17 DIAGNOSIS — B379 Candidiasis, unspecified: Secondary | ICD-10-CM

## 2017-03-17 DIAGNOSIS — N83202 Unspecified ovarian cyst, left side: Secondary | ICD-10-CM | POA: Insufficient documentation

## 2017-03-17 DIAGNOSIS — N941 Unspecified dyspareunia: Secondary | ICD-10-CM | POA: Insufficient documentation

## 2017-03-17 LAB — CYTOLOGY - PAP
Bacterial vaginitis: NEGATIVE
Candida vaginitis: POSITIVE — AB
Chlamydia: NEGATIVE
Diagnosis: NEGATIVE
Neisseria Gonorrhea: NEGATIVE
Trichomonas: NEGATIVE

## 2017-03-17 MED ORDER — FLUCONAZOLE 150 MG PO TABS: 150.0000 mg | ORAL_TABLET | Freq: Once | ORAL | 0 refills | Status: AC

## 2017-03-17 NOTE — Progress Notes (Signed)
PAP cytology revealed candida.

## 2017-03-20 ENCOUNTER — Encounter (INDEPENDENT_AMBULATORY_CARE_PROVIDER_SITE_OTHER): Payer: Self-pay

## 2017-04-10 ENCOUNTER — Ambulatory Visit (INDEPENDENT_AMBULATORY_CARE_PROVIDER_SITE_OTHER): Payer: Self-pay | Admitting: Physician Assistant

## 2017-04-10 ENCOUNTER — Encounter (INDEPENDENT_AMBULATORY_CARE_PROVIDER_SITE_OTHER): Payer: Self-pay | Admitting: Physician Assistant

## 2017-04-10 VITALS — BP 104/67 | HR 66 | Temp 98.2°F | Wt 119.0 lb

## 2017-04-10 DIAGNOSIS — R110 Nausea: Secondary | ICD-10-CM

## 2017-04-10 DIAGNOSIS — N83202 Unspecified ovarian cyst, left side: Secondary | ICD-10-CM

## 2017-04-10 DIAGNOSIS — R1011 Right upper quadrant pain: Secondary | ICD-10-CM

## 2017-04-10 LAB — POCT URINE PREGNANCY: Preg Test, Ur: NEGATIVE

## 2017-04-10 MED ORDER — ONDANSETRON HCL 4 MG PO TABS: 4.0000 mg | ORAL_TABLET | Freq: Three times a day (TID) | ORAL | 0 refills | Status: DC | PRN

## 2017-04-10 MED ORDER — NAPROXEN 500 MG PO TBEC: 500.0000 mg | DELAYED_RELEASE_TABLET | Freq: Two times a day (BID) | ORAL | 0 refills | Status: DC

## 2017-04-10 MED ORDER — OMEPRAZOLE 40 MG PO CPDR: 40.0000 mg | DELAYED_RELEASE_CAPSULE | Freq: Every day | ORAL | 3 refills | Status: DC

## 2017-04-10 MED FILL — NAPROXEN 500 MG TABLET: 500 | 7 days supply | Qty: 14 | Fill #0

## 2017-04-10 MED FILL — ?ONDANSETRON HCL 4 MG TABLE: 4 | 6 days supply | Qty: 20 | Fill #0

## 2017-04-10 MED FILL — OMEPRAZOLE DR 40 MG CAPSULE: 40 | 30 days supply | Qty: 30 | Fill #0

## 2017-04-10 NOTE — Progress Notes (Signed)
Subjective:  Patient ID: Maria Garrison, female    DOB: 1965/12/16  Age: 51 y.o. MRN: [id number]  CC: abdominal pain   HPI Maria Garrison is a 51 y.o. female with a recent US finding of hemorrhagic cyst in left ovary presents with continued LLQ pain and RUQ pain. She had an ultrasound pelvic and transvaginal on 03/17/17 revealed complex hypoechoic cystic lesion left ovary may represent a hemorrhagic cyst. Short-interval follow up ultrasound in 6-12 weeks is recommended, preferably during the week following the patient's normal menses. States he has dyspareunia, metrorrhagia. Denies menorrhagia, vaginal discharge, or genital lesions. Does not endorse CP, palpitations, SOB, HA, BRBPR, hematochezia, hematemesis, fatigue, rash, dysuria, urinary frequency, back pain, f/c/n/v.  * Information obtained through use of audio interpretation services.  Outpatient Medications Prior to Visit  Medication Sig Dispense Refill  . Multiple Vitamins-Minerals (MULTIVITAMIN WITH MINERALS) tablet Take 1 tablet by mouth daily.    Marland Kitchen. senna-docusate (SENOKOT-S) 8.6-50 MG tablet Take 1 tablet by mouth at bedtime. 30 tablet 0   No facility-administered medications prior to visit.      ROS Review of Systems  Constitutional: Negative for chills, fever and malaise/fatigue.  Eyes: Negative for blurred vision.  Respiratory: Negative for shortness of breath.   Cardiovascular: Negative for chest pain and palpitations.  Gastrointestinal: Positive for abdominal pain. Negative for nausea.  Genitourinary: Negative for dysuria and hematuria.  Musculoskeletal: Negative for joint pain and myalgias.  Skin: Negative for rash.  Neurological: Negative for tingling and headaches.  Psychiatric/Behavioral: Negative for depression. The patient is not nervous/anxious.     Objective:  BP 104/67 (BP Location: Left Arm, Patient Position: Sitting, Cuff Size: Normal)   Pulse 66   Temp 98.2 F (36.8 C) (Oral)   Wt 119 lb (54 kg)   LMP  03/13/2017 (Exact Date)   SpO2 100%   BMI 22.86 kg/m   BP/Weight 04/10/2017 03/13/2017 12/11/2015  Systolic BP 104 110 121  Diastolic BP 67 68 73  Wt. (Lbs) 119 117.6 125.1  BMI 22.86 22.59 23.65      Physical Exam  Constitutional: She is oriented to person, place, and time.  Well developed, well nourished, NAD, polite  HENT:  Head: Normocephalic and atraumatic.  Eyes: No scleral icterus.  Cardiovascular: Normal rate, regular rhythm and normal heart sounds.   Pulmonary/Chest: Effort normal and breath sounds normal.  Abdominal: Soft. Bowel sounds are normal. There is tenderness (Mild RUQ and epigastric TTP).  Genitourinary:  Genitourinary Comments: Mild left adnexal TTP  Musculoskeletal: She exhibits no edema.  Neurological: She is alert and oriented to person, place, and time. No cranial nerve deficit. Coordination normal.  Skin: Skin is warm and dry. No rash noted. No erythema. No pallor.  Psychiatric: She has a normal mood and affect. Her behavior is normal. Thought content normal.  Vitals reviewed.    Assessment & Plan:   1. Hemorrhagic cyst of left ovary - Begin naproxen (EC NAPROSYN) 500 MG EC tablet; Take 1 tablet (500 mg total) by mouth 2 (two) times daily with a meal.  Dispense: 14 tablet; Refill: 0 - US Pelvis Complete; Future - US Transvaginal Non-OB; Future  2. Abdominal pain, RUQ - Begin naproxen (EC NAPROSYN) 500 MG EC tablet; Take 1 tablet (500 mg total) by mouth 2 (two) times daily with a meal.  Dispense: 14 tablet; Refill: 0 - H. pylori antibody, IgG - Begin omeprazole (PRILOSEC) 40 MG capsule; Take 1 capsule (40 mg total) by mouth daily.  Dispense: 30 capsule; Refill:  3 - POCT urine pregnancy - Lipase - US Abdomen Limited RUQ; Future  3. Nausea without vomiting - Begin ondansetron (ZOFRAN) 4 MG tablet; Take 1 tablet (4 mg total) by mouth every 8 (eight) hours as needed for nausea or vomiting.  Dispense: 20 tablet; Refill: 0   Meds ordered this encounter   Medications  . naproxen (EC NAPROSYN) 500 MG EC tablet    Sig: Take 1 tablet (500 mg total) by mouth 2 (two) times daily with a meal.    Dispense:  14 tablet    Refill:  0    Order Specific Question:   Supervising Provider    Answer:   Quentin Angst [id number]  . ondansetron (ZOFRAN) 4 MG tablet    Sig: Take 1 tablet (4 mg total) by mouth every 8 (eight) hours as needed for nausea or vomiting.    Dispense:  20 tablet    Refill:  0    Order Specific Question:   Supervising Provider    Answer:   Quentin Angst [id number]  . omeprazole (PRILOSEC) 40 MG capsule    Sig: Take 1 capsule (40 mg total) by mouth daily.    Dispense:  30 capsule    Refill:  3    Order Specific Question:   Supervising Provider    Answer:   Quentin Angst [[id number]]    Follow-up: 4 weeks  Loletta Specter PA

## 2017-04-10 NOTE — Patient Instructions (Signed)
?  au b?ng, Ng??i l?n  Abdominal Pain, Adult  Nhi?u nguyên nhân có th? gây ra ?au b?ng. ?au b?ng th??ng không nghiêm tr?ng và ?? mà không c?n ph?i ?i?u tr? ho?c khi ???c ?i?u tr? t?i nhà. Tuy nhiên, ?ôi khi ?au b?ng là nghiêm tr?ng. Chuyên gia ch?m sóc s?c kh?e c?a quý v? s? khai thác b?nh s? và khám th?c th? ?? xác ??nh nguyên nhân gây ra ?au b?ng.  Tuân th? nh?ng h??ng d?n này ? nhà:  · Ch? s? d?ng thu?c không kê ??n và thu?c kê ??n theo ch? d?n c?a chuyên gia ch?m sóc s?c kh?e. Không dùng thu?c nhu?n tràng tr? khi ???c chuyên gia ch?m sóc s?c kh?e c?a quý v? ch? d?n.  · U?ng ?? n??c ?? gi? cho n??c ti?u trong ho?c có màu vàng nh?t.  · Theo dõi tình tr?ng c?a quý v? ?? phát hi?n b?t k? thay ??i nào.  · Tuân th? t?t c? các cu?c h?n khám l?i theo ch? d?n c?a chuyên gia ch?m sóc s?c kh?e. ?i?u này có vai trò quan tr?ng.  Hãy liên l?c v?i chuyên gia ch?m sóc s?c kh?e n?u:  · Tình tr?ng ?au b?ng c?a quý v? thay ??i ho?c tr?m tr?ng h?n.  · Quý v? không ?ói ho?c quý v? b? s?t cân mà không c? g?ng gi?m cân.  · Quý v? b? táo bón ho?c b? tiêu ch?y quá 2-3 ngày.  · Quý v? b? ?au khi ?i ti?u ho?c ?i ngoài.  · ?au b?ng khi?n quý v? t?nh gi?c vào ban ?êm.  · ?au tr? nên tr?m tr?ng h?n khi ?n, sau khi ?n, ho?c khi ?n m?t s? th?c ph?m nh?t ??nh.  · Quý v? nôn và không th? kìm l?i ???c.  · Quý v? b? s?t.  Yêu c?u tr? giúp ngay l?p t?c n?u:  · C?n ?au c?a quý v? không h?t s?m theo nh? d? ki?n c?a chuyên gia ch?m sóc s?c kh?e c?a quý v?.  · Quý v? v?n ti?p t?c b? nôn.  · Ch? c?m th?y ?au ? các vùng c?a b?ng, ch?ng h?n nh? ? ph?n b?ng d??i bên ph?i ho?c bên trái.  · Quy? vi? ?i ngoài phân có ma?u ho??c phân ma?u ?en, ho??c phân trông nh? h??c i?n.  · Quý v? b? ?au r?t nhi?u, co th?t ho??c ch???ng b?ng.  · Quý v? có các d?u hi?u m?t n??c, ch?ng h?n nh?:  ? N??c ti?u s?m màu, r?t ít n??c ti?u, ho?c không có n??c ti?u.  ? Môi n?t n?.  ? Mi?ng khô.  ? M?t tr?ng.  ? Bu?n ng?.  ? Y?u.  Thông tin này không nh?m m?c ?ích thay th? cho l?i  khuyên mà chuyên gia ch?m sóc s?c kh?e nói v?i quý v?. Hãy b?o ??m quý v? ph?i th?o lu?n b?t k? v?n ?? gì mà quý v? có v?i chuyên gia ch?m sóc s?c kh?e c?a quý v?.  Document Released: 09/23/2005 Document Revised: 09/12/2016 Document Reviewed: 03/06/2016  Elsevier Interactive Patient Education © 2017 Elsevier Inc.

## 2017-04-11 LAB — H. PYLORI ANTIBODY, IGG: H. pylori, IgG AbS: <0.8 Index Value (ref 0.00–0.79)

## 2017-04-11 LAB — LIPASE: Lipase: 58 U/L (ref 14–72)

## 2017-04-23 ENCOUNTER — Ambulatory Visit (HOSPITAL_COMMUNITY)
Admission: RE | Admit: 2017-04-23 | Discharge: 2017-04-23 | Disposition: A | Discharge status: Home / Self Care | Payer: Self-pay | Source: Ambulatory Visit | Attending: Physician Assistant | Admitting: Physician Assistant

## 2017-04-23 DIAGNOSIS — R1011 Right upper quadrant pain: Secondary | ICD-10-CM | POA: Insufficient documentation

## 2017-05-08 ENCOUNTER — Encounter (INDEPENDENT_AMBULATORY_CARE_PROVIDER_SITE_OTHER): Payer: Self-pay | Admitting: Physician Assistant

## 2017-05-08 ENCOUNTER — Ambulatory Visit (INDEPENDENT_AMBULATORY_CARE_PROVIDER_SITE_OTHER): Payer: Self-pay | Admitting: Physician Assistant

## 2017-05-08 VITALS — BP 113/69 | HR 73 | Temp 98.0°F | Wt 118.2 lb

## 2017-05-08 DIAGNOSIS — N83202 Unspecified ovarian cyst, left side: Secondary | ICD-10-CM

## 2017-05-08 DIAGNOSIS — K641 Second degree hemorrhoids: Secondary | ICD-10-CM

## 2017-05-08 DIAGNOSIS — R1031 Right lower quadrant pain: Secondary | ICD-10-CM

## 2017-05-08 MED ORDER — POLYETHYLENE GLYCOL 3350 17 GM/SCOOP PO POWD: 17.0000 g | Freq: Every day | ORAL | 1 refills | Status: DC

## 2017-05-08 MED ORDER — SENNOSIDES-DOCUSATE SODIUM 8.6-50 MG PO TABS: 1.0000 | ORAL_TABLET | Freq: Every day | ORAL | 0 refills | Status: DC

## 2017-05-08 MED ORDER — HYDROCORTISONE ACETATE 25 MG RE SUPP: 25.0000 mg | Freq: Two times a day (BID) | RECTAL | 2 refills | Status: DC

## 2017-05-08 MED FILL — HYDROCORTISONE AC 25 MG SUP: 25 | 7 days supply | Qty: 14 | Fill #0

## 2017-05-08 MED FILL — POLYETHYLENE GLYCOL 3350 PO: 30 days supply | Qty: 510 | Fill #0

## 2017-05-08 NOTE — Patient Instructions (Addendum)
?au b?ng, Ng??i l?n Abdominal Pain, Adult Nhi?u nguyn nhn c th? gy ra ?au b?ng. ?au b?ng th??ng khng nghim tr?ng v ?? m khng c?n ph?i ?i?u tr? ho?c khi ???c ?i?u tr? t?i nh. Tuy nhin, ?i khi ?au b?ng l nghim tr?ng. Chuyn gia ch?m [place] s?c kh?e c?a qu v? s? khai thc b?nh s? v khm th?c th? ?? xc ??nh nguyn nhn gy ra ?au b?ng. Tun th? nh?ng h??ng d?n ny ? nh:  Ch? s? d?ng thu?c khng k ??n v thu?c k ??n theo ch? d?n c?a chuyn gia ch?m [place] s?c kh?e. Khng dng thu?c nhu?n trng tr? khi ???c chuyn gia ch?m [place] s?c kh?e c?a qu v? ch? d?n.  U?ng ?? n??c ?? gi? cho n??c ti?u trong ho?c c mu vng nh?t.  Theo di tnh tr?ng c?a qu v? ?? pht hi?n b?t k? thay ??i no.  Tun th? t?t c? cc cu?c h?n khm l?i theo ch? d?n c?a chuyn gia ch?m [place] s?c kh?e. ?i?u ny c vai tr quan tr?ng. Hy lin l?c v?i chuyn gia ch?m [place] s?c kh?e n?u:  Tnh tr?ng ?au b?ng c?a qu v? thay ??i ho?c tr?m tr?ng h?n.  Qu v? khng ?i ho?c qu v? b? s?t cn m khng c? g?ng gi?m cn.  Qu v? b? to bn ho?c b? tiu ch?y qu 2-3 ngy.  Qu v? b? ?au khi ?i ti?u ho?c ?i ngoi.  ?au b?ng khi?n qu v? t?nh gi?c vo ban ?m.  ?au tr? nn tr?m tr?ng h?n khi ?n, sau khi ?n, ho?c khi ?n m?t s? th?c ph?m nh?t ??nh.  Qu v? nn v khng th? km l?i ???c.  Qu v? b? s?t. Yu c?u tr? gip ngay l?p t?c n?u:  C?n ?au c?a qu v? khng h?t s?m theo nh? d? ki?n c?a chuyn gia ch?m [place] s?c kh?e c?a qu v?.  Qu v? v?n ti?p t?c b? nn.  Ch? c?m th?y ?au ? cc vng c?a b?ng, ch?ng h?n nh? ? ph?n b?ng d??i bn ph?i ho?c bn tri.  Quy? vi? ?i ngoi phn c ma?u ho??c phn ma?u ?en, ho??c phn trng nh? h??c i?n.  Qu v? b? ?au r?t nhi?u, co th?t ho??c ch???ng b?ng.  Qu v? c cc d?u hi?u m?t n??c, ch?ng h?n nh?: ? N??c ti?u s?m mu, r?t t n??c ti?u, ho?c khng c n??c ti?u. ? Mi n?t n?. ? Mi?ng kh. ? M?t tr?ng. ? Bu?n ng?. ? Y?u. Thng tin ny khng nh?m m?c ?ch thay th? cho l?i  khuyn m chuyn gia ch?m [place] s?c kh?e ni v?i qu v?. Hy b?o ??m qu v? ph?i th?o lu?n b?t k? v?n ?? g m qu v? c v?i chuyn gia ch?m  s?c kh?e c?a qu v?. Document Released: 09/23/2005 Document Revised: 09/12/2016 Document Reviewed: 03/06/2016 Elsevier Interactive Patient Education  2017 Elsevier Inc. U nang bu?ng tr?ng (Ovarian Cyst) U nang bu?ng tr?ng l m?t ti ch?a ch?t l?ng hnh thnh trn bu?ng tr?ng. Bu?ng tr?ng l c? quan nh? s?n sinh ra tr?ng ? n? gi?i. C nhi?u lo?i u nang c th? hnh thnh trn cc bu?ng tr?ng. M?t s? u nang gy ra tri?u ch?ng v c?n ?i?u tr?Marland Kitchen. H?u h?t cc u nang bu?ng tr?ng ??u t? h?t, khng ph?i ung th? (l lnh tnh) v khng gy ra cc v?n ??. Nh?ng lo?i u nang ph? bi?n bao g?m:  U nang (nang) ch?c n?ng. ? X?y ra trong k? kinh nguy?t v th??ng bi?n  m?t khi c k? kinh nguy?t ti?p theo n?u qu v? khng c [place]. ? Th??ng khng gy ra tri?u ch?ng.  L?c n?i m?c t? cung. ? L cc u nang hnh thnh t? m lt t? cung (n?i m?c t? cung). ? ?i khi c?ng ???c g?i l "u nang s-c-la" v chng ch?a ??y mu chuy?n thnh mu nu. ? C th? gy ?au ? b?ng d??i trong khi giao h?p v trong k? kinh nguy?t.  U nang tuy?n. ? Pht tri?n t? cc t? bo trn b? m?t bn ngoi c?a bu?ng tr?ng. ? C th? tr? nn r?t to v lm cho b?ng d??i ?au v ?au khi giao h?p. ? C th? gy ?au d? d?i n?u chng xo?n l?i ho?c v? ra (v?).  U nang b. ? ?i khi ???c pht hi?n th?y ? c? hai bu?ng tr?ng. ? C th? ch?a cc lo?i m khc nhau c?a c? th?, ch?ng h?n nh? da, r?ng, tc, ho?c s?n. ? Th??ng khng gy ra tri?u ch?ng tr? khi cc u nang ny tr? nn r?t to.  U nang hong th?. ? Xu?t hi?n khi c qu nhi?u hooc-mn nh?t ??nh (human chorionic gonadotropin) ???c t?o ra v kch thch cc bu?ng tr?ng qu m?c ?? s?n sinh tr?ng. ? Ph? bi?n nh?t sau khi th?c hi?n cc th? thu?t dng ?? h? tr? vi?c th? [place] (th? [place] trong ?ng nghi?m). NGUYN NHN Nguyn nhn gy ra u nang bu?ng tr?ng c th?  l:  H?i ch?ng kch thch bu?ng tr?ng qu m?c. ?y l tnh tr?ng c th? pht sinh t? vi?c dng thu?c h? tr? th? [place]. Thu?c lm cho hnh thnh nhi?u u nang bu?ng tr?ng l?n.  H?i ch??ng bu?ng tr??ng ?a nang (PCOS). ?y l r?i lo?n hoocmn ph? bi?n c th? gy ra u nang bu?ng tr?ng, c?ng nh? cc v?n ?? v? chu k? kinh nguy?t ho?c kh? n?ng th? [place] c?a qu v?. CC Y?U T? NGUY C? Nh?ng y?u t? sau c th? lm qu v? d? b? u nang bu?ng tr?ng h?n:  Th?a cn ho?c bo ph.  Dng thu?c h? tr? th? [place].  S? d?ng m?t s? d?ng trnh [place] n?i ti?t t?.  Ht thu?c. TRI?U CH?NG Nhi?u u nang bu?ng tr?ng khng gy ra tri?u ch?ng. N?u c tri?u ch?ng, cc tri?u ch?ng ? c th? bao g?m:  ?au ho?c c?m gic ? n?ng trong khung ch?u.  ?au ? vng b?ng d??i.  ?au khi quan h? tnh d?c.  S?ng n? b?ng.  Cc chu k? kinh nguy?t b?t th??ng.  ?au t?ng ln khi c k? kinh nguy?t. CH?N ?ON Nh?ng u nang ny th??ng ???c tm th?y trong khi khm khung ch?u th??ng quy. Qu v? c th? ???c cho lm cc xt nghi?m ?? pht hi?n thm v? u nang, ch?ng h?n nh?:  Siu m.  Ch?p X quang khung ch?u.  Ch?p CT.  Ch?p MRI.  Xt nghi?m mu. ?I?U TR? Nhi?u u nang bu?ng tr?ng t? h?t m khng c?n ?i?u tr?Shaune Pascal gia ch?m [place] s?c kh?e c th? mu?n ki?m tra u nang c?a qu v? th??ng xuyn trong 2-3 thng ?? xem u nang c thay ??i khng. N?u qu v? ?ang trong th?i k? mn kinh, ?i?u ??c bi?t quan tr?ng l ph?i theo di u nang ch?t ch? v t? l? ung th? bu?ng tr?ng ? ph? n? mn kinh cao h?n. Khi c?n ?i?u tr?, vi?c ?i?u tr? c th? bao g?m:  Thu?c gip gi?m ?au.  Th?  thu?t d?n l?u u nang (ht d?ch l?ng).  Ph?u thu?t ?? c?t b? ton b? u nang.  ?i?u tr? b?ng hooc-mn ho?c thu?c trnh [place]. Nh?ng ph??ng php ny ?i khi ???c s? d?ng ?? gip lo?i b? m?t u nang. H??NG D?N CH?M [place] T?I NH  Ch? s? d?ng thu?c khng c?n k ??n v thu?c c?n k ??n theo ch? d?n c?a chuyn gia ch?m [place] s?c kh?e.  Khng li xe ho?c s? d?ng my mc h?ng n?ng  trong khi ?ang dng thu?c gi?m ?au ???c k ??n.  Khm khung ch?u ??nh k? v lm xt nghi?m [place] th??ng xuyn nh? ch? d?n c?a chuyn gia ch?m [place] s?c kh?e.  Tr? l?i sinh ho?t bnh th??ng theo ch? d?n c?a chuyn gia ch?m [place] s?c kh?e. H?i chuyn gia ch?m [place] s?c kh?e v? cc ho?t ??ng no an ton cho qu v?.  Khng s? d?ng b?t k? s?n ph?m no c nictin ho?c thu?c l, ch?ng ha?n nh? thu?c l ?i?u v thu?c l ?i?n t?. N?u qu v? c?n gip ?? ?? cai thu?c, hy h?i chuyn gia ch?m [place] s?c kh?e.  Tun th? t?t c? cc cu?c h?n khm l?i theo ch? d?n c?a chuyn gia ch?m [place] s?c kh?e. ?i?u ny c vai tr quan tr?ng. ?I [facility] N?U:  Kinh nguy?t c?a qu v? b? mu?n, khng ??u, ho?c ?au, ho?c khng cn kinh nguy?t.  Quy? vi? bi? ?au ? khung ch?u khng kho?i.  Qu v? c c?m gic ? n?ng ln bng quang ho?c kh ?i h?t n??c ti?u trong bng quang.  Qu v? b? ?au trong lc quan h? tnh d?c.  Qu v? c b?t k? tri?u ch?ng no ? b?ng sau ?y: ? C?m gic ??y h?i. ? C?m gic ? n?ng. ? Kho? chi?u. ? C?n ?au khng h?t. ? S?ng n?[place] Heads.  Qu v? c?m th?y m?t m?i ton thn.  Qu v? b? to bn.  Qu v? b? m?t c?m gic ngon mi?ng.  Qu v? b? m?n tr?ng c nghim tr?ng.  Qu v? b?t ??u c nhi?u lng trn c? th? v trn m?t h?n.  Qu v? t?ng cn ho?c s?t cn m khng thay ??i thi quen t?p luy?n v ?n u?ng.  Qu v? ngh? r?ng qu v? c th? ?ang c [place] NGAY L?P T?C ?I [facility] N?U:  Qu v? b? ?au b?ng d? d?i ho?c tr?m tr?ng h?n.  Qu v? khng th? ?n ho?c u?ng m khng b? nn.  Qu v? b? s?t ??t ng?t.  K? kinh nguy?t c?a qu v? n?ng h?n nhi?u so v?i bnh th??ng. Thng tin ny khng nh?m m?c ?ch thay th? cho l?i khuyn m chuyn gia ch?m [place] s?c kh?e ni v?i qu v?. Hy b?o ??m qu v? ph?i th?o lu?n b?t k? v?n ?? g m qu v? c v?i chuyn gia ch?m  s?c kh?e c?a qu v?. Document Released: 09/23/2005 Document Revised: 09/28/2013 Document Reviewed: 02/25/2016 [employer] Interactive Patient Education  2018 [employer]

## 2017-05-08 NOTE — Progress Notes (Signed)
Subjective:  Patient ID: Maria Garrison, female    DOB: 05/02/1966  Age: 51 y.o. MRN: [id number]  CC: abdominal pain  HPI Maria Garrison is a 51 y.o. female presents on f/u of LLQ abdominal pain which continues intermittently. Says there is alternating of abdominal pain between the LLQ and RLQ. Pain is in the RLQ today. Had an ultrasound pelvic and transvaginal on 03/17/17 revealed complex hypoechoic cystic lesion left ovary may represent a hemorrhagic cyst. Short-interval follow up ultrasound in 6-12 weeks is recommended, preferably during the week following the patient's normal menses. States he has dyspareunia, metrorrhagia. Denies menorrhagia, vaginal discharge, or genital lesions. Does not endorse CP, palpitations, SOB, HA, BRBPR, hematochezia, hematemesis, fatigue, rash, dysuria, urinary frequency, back pain, f/c/n/v.     Outpatient Medications Prior to Visit  Medication Sig Dispense Refill  . Multiple Vitamins-Minerals (MULTIVITAMIN WITH MINERALS) tablet Take 1 tablet by mouth daily.    . naproxen (EC NAPROSYN) 500 MG EC tablet Take 1 tablet (500 mg total) by mouth 2 (two) times daily with a meal. 14 tablet 0  . omeprazole (PRILOSEC) 40 MG capsule Take 1 capsule (40 mg total) by mouth daily. 30 capsule 3  . ondansetron (ZOFRAN) 4 MG tablet Take 1 tablet (4 mg total) by mouth every 8 (eight) hours as needed for nausea or vomiting. 20 tablet 0  . senna-docusate (SENOKOT-S) 8.6-50 MG tablet Take 1 tablet by mouth at bedtime. 30 tablet 0   No facility-administered medications prior to visit.      ROS Review of Systems  Constitutional: Negative for chills, fever and malaise/fatigue.  Eyes: Negative for blurred vision.  Respiratory: Negative for shortness of breath.   Cardiovascular: Negative for chest pain and palpitations.  Gastrointestinal: Positive for abdominal pain. Negative for nausea.  Genitourinary: Negative for dysuria and hematuria.  Musculoskeletal: Negative for joint pain and  myalgias.  Skin: Negative for rash.  Neurological: Negative for tingling and headaches.  Psychiatric/Behavioral: Negative for depression. The patient is not nervous/anxious.     Objective:  BP 113/69 (BP Location: Right Arm, Patient Position: Sitting, Cuff Size: Normal)   Pulse 73   Temp 98 F (36.7 C) (Oral)   Wt 118 lb 3.2 oz (53.6 kg)   LMP 04/19/2017   SpO2 97%   BMI 22.70 kg/m   BP/Weight 05/08/2017 04/10/2017 03/13/2017  Systolic BP 113 104 110  Diastolic BP 69 67 68  Wt. (Lbs) 118.2 119 117.6  BMI 22.7 22.86 22.59      Physical Exam  Constitutional: She is oriented to person, place, and time.  Well developed, well nourished, NAD, polite  HENT:  Head: Normocephalic and atraumatic.  Eyes: No scleral icterus.  Neck: Normal range of motion. Neck supple. No thyromegaly present.  Cardiovascular: Normal rate, regular rhythm and normal heart sounds.   Pulmonary/Chest: Effort normal and breath sounds normal.  Abdominal: Soft. Bowel sounds are normal. There is tenderness (RLQ TTP, negative Rovsing's, Obturator, and Psoas sign).  Genitourinary:  Genitourinary Comments: Small external hemorrhoid, nonthrombosed. Several hemorrhoidal skin tags.  Musculoskeletal: She exhibits no edema.  Neurological: She is alert and oriented to person, place, and time. No cranial nerve deficit. Coordination normal.  Skin: Skin is warm and dry. No rash noted. No erythema. No pallor.  Psychiatric: She has a normal mood and affect. Her behavior is normal. Thought content normal.  Vitals reviewed.    Assessment & Plan:    1. Right lower quadrant abdominal pain - CT ABDOMEN PELVIS WO CONTRAST; Future  2. Cyst of left ovary - US Pelvis Complete; Future - US Transvaginal Non-OB; Future  3. Grade II hemorrhoids - polyethylene glycol powder (GLYCOLAX/MIRALAX) powder; Take 17 g by mouth daily.  Dispense: 3350 g; Refill: 1 - hydrocortisone (ANUSOL-HC) 25 MG suppository; Place 1 suppository (25 mg  total) rectally 2 (two) times daily.  Dispense: 14 suppository; Refill: 2 - senna-docusate (SENOKOT-S) 8.6-50 MG tablet; Take 1 tablet by mouth at bedtime.  Dispense: 30 tablet; Refill: 0     Meds ordered this encounter  Medications  . polyethylene glycol powder (GLYCOLAX/MIRALAX) powder    Sig: Take 17 g by mouth daily.    Dispense:  3350 g    Refill:  1    Order Specific Question:   Supervising Provider    Answer:   Quentin AngstJEGEDE, OLUGBEMIGA E [id number]  . hydrocortisone (ANUSOL-HC) 25 MG suppository    Sig: Place 1 suppository (25 mg total) rectally 2 (two) times daily.    Dispense:  14 suppository    Refill:  2    Order Specific Question:   Supervising Provider    Answer:   Quentin AngstJEGEDE, OLUGBEMIGA E [id number]  . senna-docusate (SENOKOT-S) 8.6-50 MG tablet    Sig: Take 1 tablet by mouth at bedtime.    Dispense:  30 tablet    Refill:  0    Order Specific Question:   Supervising Provider    Answer:   Quentin AngstJEGEDE, OLUGBEMIGA E [id number]    Follow-up: Return if symptoms worsen or fail to improve.   Loletta Specteroger David Keerthana Vanrossum PA

## 2017-05-12 ENCOUNTER — Ambulatory Visit (HOSPITAL_COMMUNITY)
Admission: RE | Admit: 2017-05-12 | Discharge: 2017-05-12 | Disposition: A | Discharge status: Home / Self Care | Payer: Self-pay | Source: Ambulatory Visit | Attending: Physician Assistant | Admitting: Physician Assistant

## 2017-05-12 DIAGNOSIS — N83201 Unspecified ovarian cyst, right side: Secondary | ICD-10-CM | POA: Insufficient documentation

## 2017-05-12 DIAGNOSIS — N83202 Unspecified ovarian cyst, left side: Secondary | ICD-10-CM

## 2017-05-13 ENCOUNTER — Ambulatory Visit (HOSPITAL_COMMUNITY)
Admission: RE | Admit: 2017-05-13 | Discharge: 2017-05-13 | Disposition: A | Discharge status: Home / Self Care | Payer: Self-pay | Source: Ambulatory Visit | Attending: Physician Assistant | Admitting: Physician Assistant

## 2017-05-13 DIAGNOSIS — K449 Diaphragmatic hernia without obstruction or gangrene: Secondary | ICD-10-CM | POA: Insufficient documentation

## 2017-05-13 DIAGNOSIS — R1031 Right lower quadrant pain: Secondary | ICD-10-CM | POA: Insufficient documentation

## 2017-05-13 DIAGNOSIS — M5126 Other intervertebral disc displacement, lumbar region: Secondary | ICD-10-CM | POA: Insufficient documentation

## 2017-05-13 DIAGNOSIS — N83201 Unspecified ovarian cyst, right side: Secondary | ICD-10-CM | POA: Insufficient documentation

## 2017-05-13 DIAGNOSIS — M48061 Spinal stenosis, lumbar region without neurogenic claudication: Secondary | ICD-10-CM | POA: Insufficient documentation

## 2017-07-08 ENCOUNTER — Ambulatory Visit (INDEPENDENT_AMBULATORY_CARE_PROVIDER_SITE_OTHER): Payer: BLUE CROSS/BLUE SHIELD | Admitting: Physician Assistant

## 2017-07-08 ENCOUNTER — Encounter (INDEPENDENT_AMBULATORY_CARE_PROVIDER_SITE_OTHER): Payer: Self-pay | Admitting: Physician Assistant

## 2017-07-08 VITALS — BP 108/70 | HR 63 | Temp 98.2°F | Wt 116.6 lb

## 2017-07-08 DIAGNOSIS — N898 Other specified noninflammatory disorders of vagina: Secondary | ICD-10-CM

## 2017-07-08 DIAGNOSIS — R102 Pelvic and perineal pain: Secondary | ICD-10-CM | POA: Diagnosis not present

## 2017-07-08 DIAGNOSIS — K649 Unspecified hemorrhoids: Secondary | ICD-10-CM | POA: Diagnosis not present

## 2017-07-08 MED ORDER — HYDROCORTISONE ACETATE 25 MG RE SUPP: 25.0000 mg | Freq: Two times a day (BID) | RECTAL | 0 refills | Status: DC

## 2017-07-08 MED ORDER — NAPROXEN 500 MG PO TABS: 500.0000 mg | ORAL_TABLET | Freq: Two times a day (BID) | ORAL | 0 refills | Status: DC

## 2017-07-08 MED ORDER — SENNOSIDES-DOCUSATE SODIUM 8.6-50 MG PO TABS: 1.0000 | ORAL_TABLET | Freq: Every day | ORAL | 0 refills | Status: DC

## 2017-07-08 MED ORDER — METRONIDAZOLE 500 MG PO TABS: 500.0000 mg | ORAL_TABLET | Freq: Two times a day (BID) | ORAL | 0 refills | Status: AC

## 2017-07-08 MED ORDER — FLUCONAZOLE 150 MG PO TABS: 150.0000 mg | ORAL_TABLET | Freq: Once | ORAL | 0 refills | Status: AC

## 2017-07-08 NOTE — Progress Notes (Signed)
Subjective:  Patient ID: Maria Garrison, female    DOB: September 21, 1966  Age: 51 y.o. MRN: [id number]  CC: pain left side  HPI   Maria Garrison is a 51 y.o. female presents on f/u of LLQ abdominal pain which continues intermittently. Says there is alternating of abdominal pain between the LLQ and RLQ. Pain is once again at the LLQ today. Had RLQ pain at the previous visit. Had an ultrasound pelvic and transvaginal on 03/17/17 revealed complex hypoechoic cystic lesion left ovary may represent a hemorrhagic cyst. Short-interval follow up ultrasound in 6-12 weeks revealed no cyst. States she has dyspareunia, metrorrhagia and vaginal itching. Denies menorrhagia, vaginal discharge, or genital lesions. Does not endorse CP, palpitations, SOB, HA, BRBPR, hematochezia, hematemesis, fatigue, rash, dysuria, urinary frequency, back pain, f/c/n/v.     Outpatient Medications Prior to Visit  Medication Sig Dispense Refill  . hydrocortisone (ANUSOL-HC) 25 MG suppository Place 1 suppository (25 mg total) rectally 2 (two) times daily. 14 suppository 2  . Multiple Vitamins-Minerals (MULTIVITAMIN WITH MINERALS) tablet Take 1 tablet by mouth daily.    . naproxen (EC NAPROSYN) 500 MG EC tablet Take 1 tablet (500 mg total) by mouth 2 (two) times daily with a meal. (Patient not taking: Reported on 07/08/2017) 14 tablet 0  . omeprazole (PRILOSEC) 40 MG capsule Take 1 capsule (40 mg total) by mouth daily. (Patient not taking: Reported on 07/08/2017) 30 capsule 3  . ondansetron (ZOFRAN) 4 MG tablet Take 1 tablet (4 mg total) by mouth every 8 (eight) hours as needed for nausea or vomiting. (Patient not taking: Reported on 07/08/2017) 20 tablet 0  . polyethylene glycol powder (GLYCOLAX/MIRALAX) powder Take 17 g by mouth daily. (Patient not taking: Reported on 07/08/2017) 3350 g 1  . senna-docusate (SENOKOT-S) 8.6-50 MG tablet Take 1 tablet by mouth at bedtime. (Patient not taking: Reported on 07/08/2017) 30 tablet 0   No  facility-administered medications prior to visit.      ROS Review of Systems  Constitutional: Negative for chills, fever and malaise/fatigue.  Eyes: Negative for blurred vision.  Respiratory: Negative for shortness of breath.   Cardiovascular: Negative for chest pain and palpitations.  Gastrointestinal: Negative for abdominal pain and nausea.  Genitourinary: Negative for dysuria and hematuria.  Musculoskeletal: Negative for joint pain and myalgias.  Skin: Negative for rash.  Neurological: Negative for tingling and headaches.  Psychiatric/Behavioral: Negative for depression. The patient is not nervous/anxious.     Objective:  BP 108/70 (BP Location: Left Arm, Patient Position: Sitting, Cuff Size: Normal)   Pulse 63   Temp 98.2 F (36.8 C) (Oral)   Wt 116 lb 9.6 oz (52.9 kg)   LMP 06/08/2017 (Approximate)   SpO2 100%   BMI 22.40 kg/m   BP/Weight 07/08/2017 05/08/2017 04/10/2017  Systolic BP 108 113 104  Diastolic BP 70 69 67  Wt. (Lbs) 116.6 118.2 119  BMI 22.4 22.7 22.86      Physical Exam  Constitutional: She is oriented to person, place, and time.  Well developed, well nourished, NAD, polite  HENT:  Head: Normocephalic and atraumatic.  Eyes: No scleral icterus.  Cardiovascular: Normal rate, regular rhythm and normal heart sounds.   Pulmonary/Chest: Effort normal and breath sounds normal.  Genitourinary:  Genitourinary Comments: Scant greyish vaginal discharge. No adnexal mass or pain bilaterally. No uterine mass or pain. TTP over the pubic symphis and at the upper left aspect of the perineum. Small external hemorrhoid.   Musculoskeletal: She exhibits no edema.  Neurological: She  is alert and oriented to person, place, and time. No cranial nerve deficit. Coordination normal.  Skin: Skin is warm and dry. No rash noted. No erythema. No pallor.  Psychiatric: She has a normal mood and affect. Her behavior is normal. Thought content normal.  Vitals  reviewed.    Assessment & Plan:    1. Pelvic pain in female - Begin Naproxen - Previous US pelvic/transvag revealed hemorrhagic cyst of left ovary. Subsequent US pelvic/transvag did not identify any left sided cyst. May have self resolved. However, patient has once again began to feel pain in the left adnexal region. I will send to GYN for further evaluation and treatment. Pap results have been negative for malignancy, CT/GC, BV, and trichomonas. - Ambulatory referral to Gynecology  2. Hemorrhoids, unspecified hemorrhoid type - Ambulatory referral to Gastroenterology - Begin hydrocortisone (ANUSOL-HC) 25 MG suppository; Place 1 suppository (25 mg total) rectally 2 (two) times daily.  Dispense: 12 suppository; Refill: 0  3. Vaginal itching - Pelvic exam likely consistent with BV.  - Begin Metronidazole 500 mg BID x7 days - Take fluconazole 150 mg x1 tablet after finishing metronidazole. - Begin Docusate Senna 50 mg qhs  Meds ordered this encounter  Medications  . hydrocortisone (ANUSOL-HC) 25 MG suppository    Sig: Place 1 suppository (25 mg total) rectally 2 (two) times daily.    Dispense:  12 suppository    Refill:  0    Order Specific Question:   Supervising Provider    Answer:   Quentin Angst [id number]    Follow-up: No Follow-up on file.   Loletta Specter PA

## 2017-07-08 NOTE — Patient Instructions (Signed)
Vim m ??o (Vaginitis) Vim m ??o l hi?n t??ng m ??o b? vim. B?nh ny th??ng gy ra b?i s? thay ??i trong cn b?ng bnh th??ng gi?a vi khu?n v n?m men trong m ??o. S? thay ??i cn b?ng ny gy ra s? pht tri?n qu m?c c?a m?t s? vi khu?n ho?c n?m men nh?t ??nh gy vim. C nhi?u lo?i vim m ??o khc nhau, nh?ng nh?ng lo?i ph? bi?n nh?t l:   Nhi?m khu?n m ??o.  Nhi?m n?m men (candida).  Vim m ??o trichomonas. ?y l b?nh nhi?m trng ly truy?n qua ???ng tnh d?c (STI).  Vim m ??o do vi rt.  Vim m ??o teo.  Vim m ??o d? ?ng. NGUYN NHN Nguyn nhn ty thu?c vo lo?i vim m ??o. Vim m ??o c th? gy ra b?i:  Vi khu?n (nhi?m khu?n m ??o).  N?m men (nhi?m n?m men).  K sinh trng (vim m ??o trichomonas)  Vi rt (vim m ??o do vi rt).  M?c hocmon th?p (vim m ??o teo). M?c hocmon th?p c th? x?y ra trong khi mang thai, cho con b ho?c sau khi mn kinh.  Ch?t kch thch, ch?ng h?n nh? t?m b?t, b?ng v? sinh c mi th?m v bnh x?t n? (vim m ??o d? ?ng). Cc y?u t? khc c th? thay ??i cn b?ng bnh th??ng c?a n?m men v vi khu?n trong m ??o. Cc y?u t? ny bao g?m:  Thu?c khng sinh.  V? sinh khng t?t.  Mng ch?n, b?t bi?n m ??o, ch?t di?t tinh trng, thu?c ng?a [place] v d?ng c? t? cung (IUD).  Quan h? tnh d?c.  Nhi?m trng.  B?nh ti?u ???ng khng ???c ki?m sot.  H? th?ng mi?n d?ch b? suy y?u. TRI?U CH?NG Tri?u ch?ng c th? khc nhau, ty thu?c vo nguyn nhn gy vim m ??o. Cc tri?u ch?ng ph? bi?n bao g?m:  Ra kh h? b?t th??ng ? m ??o. ? D?ch c mu tr?ng, xm ho?c vng v?i nhi?m khu?n m ??o. ? D?ch dy, c mu tr?ng v c mi ph mt v?i nhi?m n?m men. ? D?ch c b?t v mu vng ho?c xanh v?i trichomonas.  m ??o c mi hi. ? Mi tanh v?i nhi?m khu?n m ??o.  Ng?a, ?au ho?c s?ng m ??o.  ?au khi giao h?p.  ?au ho?c rt khi ?i ti?u. ?i khi khng c tri?u ch?ng. ?I?U TR? Ph??ng php ?i?u tr? ty thu?c vo lo?i nhi?m  trng.  Nhi?m khu?n m ??o v trichomonas th??ng ???c ?i?u tr? b?ng cc d?ng kem ho?c thu?c vin khng sinh.  Nhi?m n?m men th??ng ???c ?i?u tr? b?ng thu?c ch?ng n?m, ch?ng h?n nh? kem ho?c thu?c ??n m ??o.  Vim m ??o do vi rt khng c cch ch?a kh?i hon ton, nh?ng tri?u ch?ng c th? ???c ?i?u tr? b?ng thu?c lm gi?m c?m gic kh ch?u. B?n tnh c?a b?n c?ng c?n ???c ?i?u tr?.  Vim m ??o teo c th? ???c ?i?u tr? b?ng kem, thu?c vin, thu?c ??n estrogen ho?c vng m ??o. N?u m ??o b? kh, cc ch?t bi tr?n v kem d??ng ?m c th? gip ??. B?n c th? ???c h??ng d?n trnh x phng th?m, bnh x?t ho?c th?t r?a m ??o.  ?i?u tr? vim m ??o d? ?ng bao g?m b? s? d?ng s?n ph?m gy ra v?n ??. Cc lo?i kem m ??o c th? ???c s? d?ng ?? ?i?u tr? cc tri?u ch?ng. H??NG D?N CH?M  [place] T?I NH  S? d?ng t?t c? thu?c theo ch? d?n c?a chuyn gia ch?m [place] s?c kh?e.  Gi? vng b? ph?n sinh d?c s?ch v kh. Trnh x phng v ch? r?a vng ny b?ng n??c.  Trnh th?t r?a. N c th? lo?i b? cc vi khu?n c l?i trong m ??o.  Khng s? d?ng b?ng v? sinh ho?c c quan h? tnh d?c cho ??n khi vim m ??o ???c ?i?u tr?. S? d?ng mi?ng dn v? sinh trong khi b? vim m ??o.  Lau t? tr??c ra sau. Lm nh? v?y ?? trnh ly lan vi khu?n t? tr?c trng sang m ??o.  ?? khng kh ??n vng b? ph?n sinh d?c c?a b?n. ? M?c ?? lt b?ng s?i bng ?? lm gi?m s? tch t? h?i ?m. ? Trnh m?c ?? lt trong khi ng? cho ??n khi h?t vim m ??o. ? Trnh qu?n v ?? lt b ch?t ho?c ?? nylon m khng c t?m lt b?ng s?i bng. ? C?i b? qu?n o ??t (??c bi?t l qu?n o t?m) cng s?m cng t?t.  S? d?ng cc s?n ph?m nh?, khng c mi th?m. [place] s? d?ng cc ch?t kch thch, ch?ng h?n nh?: ? Bnh x?t th?m n?. ? N??c x? v?i. ? Ch?t t?y r?a c mi th?m. ? B?ng v? sinh c mi th?m. ? X phng th?m ho?c t?m b?t.  Th?c hnh tnh d?c an ton v s? d?ng bao cao su. [place]err[place]n [place] su c th? ng?n ng?a ly vim m ??o trichomonas v vim m ??o do vi  rt. HY ?I KHM N?U:  B?n b? ?au b?ng.  B?n b? s?t ho?c c cc tri?u ch?ng ko di h?n 2-3 ngy.  B?n b? s?t v cc tri?u ch?ng c?a b?n ??t nhin x?u ?i. Thng tin ny khng nh?m m?c ?ch thay th? cho l?i khuyn m chuyn gia ch?m [place] s?c kh?e ni v?i qu v?. Hy b?o ??m qu v? ph?i th?o lu?n b?t k? v?n ?? g m qu v? c v?i chuyn gia ch?m [place] s?c kh?e c?a qu v?. Document Released: 06/17/2012 Document Revised: 05/26/2013 Elsevier Interactive Patient Education  2017 Elsevier Inc. U nang bu?ng tr?ng (Ovarian Cyst) U nang bu?ng tr?ng l m?t ti ch?a ch?t l?ng hnh thnh trn bu?ng tr?ng. Bu?ng tr?ng l c? quan nh? s?n sinh ra tr?ng ? n? gi?i. C nhi?u lo?i u nang c th? hnh thnh trn cc bu?ng tr?ng. M?t s? u nang gy ra tri?u ch?ng v c?n ?i?u tr?Marland Kitchen H?u h?t cc u nang bu?ng tr?ng ??u t? h?t, khng ph?i ung th? (l lnh tnh) v khng gy ra cc v?n ??. Nh?ng lo?i u nang ph? bi?n bao g?m:  U nang (nang) ch?c n?ng. ? X?y ra trong k? kinh nguy?t v th??ng bi?n m?t khi c k? kinh nguy?t ti?p theo n?u qu v? khng c [place]. ? Th??ng khng gy ra tri?u ch?ng.  L?c n?i m?c t? cung. ? L cc u nang hnh thnh t? m lt t? cung (n?i m?c t? cung). ? ?i khi c?ng ???c g?i l "u nang s-c-la" v chng ch?a ??y mu chuy?n thnh mu nu. ? C th? gy ?au ? b?ng d??i trong khi giao h?p v trong k? kinh nguy?t.  U nang tuy?n. ? Pht tri?n t? cc t? bo trn b? m?t bn ngoi c?a bu?ng tr?ng. ? C th? tr? nn r?t to v lm cho b?ng d??i ?au v ?au khi giao h?p. ? C th? gy ?  au d? d?i n?u chng xo?n l?i ho?c v? ra (v?).  U nang b. ? ?i khi ???c pht hi?n th?y ? c? hai bu?ng tr?ng. ? C th? ch?a cc lo?i m khc nhau c?a c? th?, ch?ng h?n nh? da, r?ng, tc, ho?c s?n. ? Th??ng khng gy ra tri?u ch?ng tr? khi cc u nang ny tr? nn r?t to.  U nang hong th?. ? Xu?t hi?n khi c qu nhi?u hooc-mn nh?t ??nh (human chorionic gonadotropin) ???c t?o ra v kch thch cc bu?ng tr?ng qu m?c ?? s?n  sinh tr?ng. ? Ph? bi?n nh?t sau khi th?c hi?n cc th? thu?t dng ?? h? tr? vi?c th? [place] (th? [place] trong ?ng nghi?m). NGUYN NHN Nguyn nhn gy ra u nang bu?ng tr?ng c th? l:  H?i ch?ng kch thch bu?ng tr?ng qu m?c. ?y l tnh tr?ng c th? pht sinh t? vi?c dng thu?c h? tr? th? [place]. Thu?c lm cho hnh thnh nhi?u u nang bu?ng tr?ng l?n.  H?i ch??ng bu?ng tr??ng ?a nang (PCOS). ?y l r?i lo?n hoocmn ph? bi?n c th? gy ra u nang bu?ng tr?ng, c?ng nh? cc v?n ?? v? chu k? kinh nguy?t ho?c kh? n?ng th? [place] c?a qu v?. CC Y?U T? NGUY C? Nh?ng y?u t? sau c th? lm qu v? d? b? u nang bu?ng tr?ng h?n:  Th?a cn ho?c bo ph.  Dng thu?c h? tr? th? [place].  S? d?ng m?t s? d?ng trnh [place] n?i ti?t t?.  Ht thu?c. TRI?U CH?NG Nhi?u u nang bu?ng tr?ng khng gy ra tri?u ch?ng. N?u c tri?u ch?ng, cc tri?u ch?ng ? c th? bao g?m:  ?au ho?c c?m gic ? n?ng trong khung ch?u.  ?au ? vng b?ng d??i.  ?au khi quan h? tnh d?c.  S?ng n? b?ng.  Cc chu k? kinh nguy?t b?t th??ng.  ?au t?ng ln khi c k? kinh nguy?t. CH?N ?ON Nh?ng u nang ny th??ng ???c tm th?y trong khi khm khung ch?u th??ng quy. Qu v? c th? ???c cho lm cc xt nghi?m ?? pht hi?n thm v? u nang, ch?ng h?n nh?:  Siu m.  Ch?p X quang khung ch?u.  Ch?p CT.  Ch?p MRI.  Xt nghi?m mu. ?I?U TR? Nhi?u u nang bu?ng tr?ng t? h?t m khng c?n ?i?u tr?Shaune Pascal gia ch?m [place] s?c kh?e c th? mu?n ki?m tra u nang c?a qu v? th??ng xuyn trong 2-3 thng ?? xem u nang c thay ??i khng. N?u qu v? ?ang trong th?i k? mn kinh, ?i?u ??c bi?t quan tr?ng l ph?i theo di u nang ch?t ch? v t? l? ung th? bu?ng tr?ng ? ph? n? mn kinh cao h?n. Khi c?n ?i?u tr?, vi?c ?i?u tr? c th? bao g?m:  Thu?c gip gi?m ?au.  Th? thu?t d?n l?u u nang (ht d?ch l?ng).  Ph?u thu?t ?? c?t b? ton b? u nang.  ?i?u tr? b?ng hooc-mn ho?c thu?c trnh [place]. Nh?ng ph??ng php ny ?i khi ???c s? d?ng ?? gip lo?i b? m?t u  nang. H??NG D?N CH?M [place] T?I NH  Ch? s? d?ng thu?c khng c?n k ??n v thu?c c?n k ??n theo ch? d?n c?a chuyn gia ch?m [place] s?c kh?e.  Khng li xe ho?c s? d?ng my mc h?ng n?ng trong khi ?ang dng thu?c gi?m ?au ???c k ??n.  Khm khung ch?u ??nh k? v lm xt nghi?m Pap th??ng xuyn nh? ch? d?n c?a chuyn gia ch?m [place] s?c kh?e.  Tr?  l?i sinh ho?t bnh th??ng theo ch? d?n c?a chuyn gia ch?m [place] s?c kh?e. H?i chuyn gia ch?m [place] s?c kh?e v? cc ho?t ??ng no an ton cho qu v?.  Khng s? d?ng b?t k? s?n ph?m no c nictin ho?c thu?c l, ch?ng ha?n nh? thu?c l ?i?u v thu?c l ?i?n t?. N?u qu v? c?n gip ?? ?? cai thu?c, hy h?i chuyn gia ch?m [place] s?c kh?e.  Tun th? t?t c? cc cu?c h?n khm l?i theo ch? d?n c?a chuyn gia ch?m [place] s?c kh?e. ?i?u ny c vai tr quan tr?ng. ?I KHM N?U:  Kinh nguy?t c?a qu v? b? mu?n, khng ??u, ho?c ?au, ho?c khng cn kinh nguy?t.  Quy? vi? bi? ?au ? khung ch?u khng kho?i.  Qu v? c c?m gic ? n?ng ln bng quang ho?c kh ?i h?t n??c ti?u trong bng quang.  Qu v? b? ?au trong lc quan h? tnh d?c.  Qu v? c b?t k? tri?u ch?ng no ? b?ng sau ?y: ? C?m gic ??y h?i. ? C?m gic ? n?ng. ? Kho? chi?u. ? C?n ?au khng h?t. ? S?ng n?Ladell Heads v? c?m th?y m?t m?i ton thn.  Qu v? b? to bn.  Qu v? b? m?t c?m gic ngon mi?ng.  Qu v? b? m?n tr?ng c nghim tr?ng.  Qu v? b?t ??u c nhi?u lng trn c? th? v trn m?t h?n.  Qu v? t?ng cn ho?c s?t cn m khng thay ??i thi quen t?p luy?n v ?n u?ng.  Qu v? ngh? r?ng qu v? c th? ?ang c [place]. NGAY L?P T?C ?I KHM N?U:  Qu v? b? ?au b?ng d? d?i ho?c tr?m tr?ng h?n.  Qu v? khng th? ?n ho?c u?ng m khng b? nn.  Qu v? b? s?t ??t ng?t.  K? kinh nguy?t c?a qu v? n?ng h?n nhi?u so v?i bnh th??ng. Thng tin ny khng nh?m m?c ?ch thay th? cho l?i khuyn m chuyn gia ch?m [place] s?c kh?e ni v?i qu v?. Hy b?o ??m qu v? ph?i th?o lu?n b?t k? v?n ?? g m qu v? c v?i  chuyn gia ch?m [place] s?c kh?e c?a qu v?. Document Released: 09/23/2005 Document Revised: 09/28/2013 Document Reviewed: 02/25/2016 Elsevier Interactive Patient Education  2018 [employer].

## 2017-07-30 ENCOUNTER — Ambulatory Visit: Payer: BLUE CROSS/BLUE SHIELD | Admitting: Obstetrics and Gynecology

## 2017-08-04 ENCOUNTER — Ambulatory Visit: Payer: BLUE CROSS/BLUE SHIELD | Admitting: Obstetrics and Gynecology

## 2017-08-04 ENCOUNTER — Encounter: Payer: Self-pay | Admitting: Obstetrics and Gynecology

## 2017-08-04 ENCOUNTER — Encounter: Payer: Self-pay | Admitting: Gastroenterology

## 2017-08-04 ENCOUNTER — Other Ambulatory Visit: Payer: Self-pay | Admitting: Obstetrics and Gynecology

## 2017-08-04 VITALS — BP 100/62 | HR 76 | Resp 22 | Ht 60.0 in | Wt 117.8 lb

## 2017-08-04 DIAGNOSIS — R109 Unspecified abdominal pain: Secondary | ICD-10-CM

## 2017-08-04 DIAGNOSIS — R634 Abnormal weight loss: Secondary | ICD-10-CM

## 2017-08-04 DIAGNOSIS — R350 Frequency of micturition: Secondary | ICD-10-CM | POA: Diagnosis not present

## 2017-08-04 DIAGNOSIS — Z139 Encounter for screening, unspecified: Secondary | ICD-10-CM

## 2017-08-04 DIAGNOSIS — N926 Irregular menstruation, unspecified: Secondary | ICD-10-CM | POA: Diagnosis not present

## 2017-08-04 LAB — POCT URINALYSIS DIPSTICK

## 2017-08-04 LAB — POCT URINE PREGNANCY: Preg Test, Ur: NEGATIVE

## 2017-08-04 NOTE — Patient Instructions (Addendum)
Try [place] Jelly, olive oil, or coconut oil to help with dryness during intercourse.

## 2017-08-04 NOTE — Progress Notes (Addendum)
51 y.o. [id number] Married [place] female referred for RUQ pain and dyspareunia. Also wants well woman visit and hemorrhoid check.   Interpretor video system used for consultation.   RUQ pain for more than 2 years and has seen PCP for evaluations. The last 3 weeks having increasing pain.  Nothing special makes the pain worse.  Denies nausea or vomiting.  Eating brings on heart burn.   States she is also having pain in the left lower abdomen for 4 - 5 years.  Now having pain and diarrhea.  Using [employer]. Fresh vegetables cause pain.   CT scan 05/13/17 showing 1.6 x 1.3 cm right ovarian cyst consistent with likely hemorrhagia cyst and spinal stenosis. Pelvic US 05/12/17 showing normal uterus, EMS 7 mm, 1.9 cm cyst of right ovary, normal left ovary, and minimal free fluid. Lipase 58 and H Pylori negative on 04/10/17.  Had a menses 07-17-17 and started bleeding again today. This is the first time this has occurred.   Menses now last up to 7 days.  Pad change 4 - 5 times per day. No pain with menses.  Has pain when she is not on her period.  Has pain with intercourse every time.  Reports dryness.   Negative GC/CT/trichomonas on 03/13/17 pap.  Loosing weight.  Urinary frequency.  No dysuria.   UPT: Negative  PCP:  Sindy Messingoger Gomez, MD   Patient's last menstrual period was 07/17/2017 (exact date).     Period Cycle (Days): 30 Period Duration (Days): 3 Period Pattern: Regular Menstrual Flow: Moderate Menstrual Control: Maxi pad Dysmenorrhea: None     Sexually active: Yes.   female The current method of family planning is none.    Exercising: Yes. Walking Smoker:  no  Health Maintenance: Pap:  03-13-17 Neg History of abnormal Pap:  no MMG:  NEVER Colonoscopy:  n/a BMD:   n/a  Result  n/a TDaP:  07-27-15 Gardasil:   no WUJ:WJXBJYHIV:Unsure Hep C:Unsure Screening Labs:   Urine today: Unable to read due to blood   reports that she has never smoked. She has never used smokeless tobacco. She  reports that she does not drink alcohol or use drugs.  Past Medical History:  Diagnosis Date  . Abdominal pain in female    For 3-4 years  . Depression   . Urinary incontinence     Past Surgical History:  Procedure Laterality Date  . HEMORROIDECTOMY      Current Outpatient Prescriptions  Medication Sig Dispense Refill  . hydrocortisone (ANUSOL-HC) 25 MG suppository Place 1 suppository (25 mg total) rectally 2 (two) times daily. 14 suppository 2  . Multiple Vitamins-Minerals (MULTIVITAMIN WITH MINERALS) tablet Take 1 tablet by mouth daily.    . naproxen (NAPROSYN) 500 MG tablet Take 1 tablet (500 mg total) by mouth 2 (two) times daily with a meal. 30 tablet 0  . ondansetron (ZOFRAN) 4 MG tablet Take 4 mg by mouth every 8 (eight) hours as needed for nausea or vomiting.    . polyethylene glycol powder (GLYCOLAX/MIRALAX) powder MIX 1 CAPFUL  17G  IN WITH 8 OZ LIQUID AND DRINK ONCE DAILY  1  . senna-docusate (SENOKOT-S) 8.6-50 MG tablet Take 1 tablet by mouth at bedtime. 30 tablet 0   No current facility-administered medications for this visit.     Family History  Problem Relation Age of Onset  . Other Mother        Lung Disease??  . Cirrhosis Father     ROS:  Pertinent items are  noted in HPI.  Otherwise, a comprehensive ROS was negative.  Exam:   BP 100/62 (BP Location: Right Arm, Patient Position: Sitting, Cuff Size: Normal)   Pulse 76   Resp (!) 22   Ht 5' (1.524 m)   Wt 117 lb 12.8 oz (53.4 kg)   LMP 07/17/2017 (Exact Date)   BMI 23.01 kg/m     General appearance: alert, cooperative and appears stated age Head: Normocephalic, without obvious abnormality, atraumatic Neck: no adenopathy, supple, symmetrical, trachea midline and thyroid normal to inspection and palpation Lungs: clear to auscultation bilaterally Breasts: normal appearance, no masses or tenderness, No nipple retraction or dimpling, No nipple discharge or bleeding, No axillary or supraclavicular  adenopathy Heart: regular rate and rhythm Abdomen: soft, non-tender; no masses, no organomegaly Extremities: extremities normal, atraumatic, no cyanosis or edema Skin: Skin color, texture, turgor normal. No rashes or lesions Lymph nodes: Cervical, supraclavicular, and axillary nodes normal. No abnormal inguinal nodes palpated Neurologic: Grossly normal  Pelvic: External genitalia:  no lesions              Urethra:  normal appearing urethra with no masses, tenderness or lesions              Bartholins and Skenes: normal                 Vagina: normal appearing vagina with normal color and discharge, no lesions              Cervix: no lesions.  No CMT.              Pap taken: No. Bimanual Exam:  Uterus:  normal size, contour, position, consistency, mobility, non-tender              Adnexa: no mass, fullness, tenderness              Rectal exam: Yes.  .  Confirms.              Anus:  normal sphincter tone,  Small hemorrhoid.  Chaperone was present for exam.  Assessment:   Chronic abdominal pain undetermined etiology.  Spinal stenosis on CT scan.  Dysparueuia.  Atrophy.  Weight loss.   Plan:   The patient can benefit from a comprehensive work up of her pain and I would like her to see GI and her PCP for evaluation of possible musculoskeletal source of pain.  Discussed water based and oil based lubricants.  We talked about potential use of birth control pills to control pelvic pain.  Patient is concerned about side effects and whether this could make her underlying abdominal pain worse.  This was therefore not prescribed today. TSH. Mammogram will be scheduled.   This needs to be completed before any hormonal prescription can be given. Return if has continued pain or bleeding.    After visit summary provided.   ___60____ minutes face to face time of which over 50% was spent in counseling.  Extensive chart review also done. This was a problem visit and not a well woman visit.

## 2017-08-04 NOTE — Progress Notes (Signed)
Scheduled patient while in office for [facility] at 1:30 pm with 1:10 pm arrival. Call to Dr.Nandigam's office patient is already scheduled for 09/17/2017 at 9:30 am. Per Judeth CornfieldStephanie this is the earliest available appointment. Attempted to move patient to earlier appointment per staff patient will be placed on a wait list for cancellations to move appointment forward. Patient verbalizes understanding. Advised will review with Dr.Silva and call if she has additional recommendations.

## 2017-08-05 ENCOUNTER — Ambulatory Visit
Admission: RE | Admit: 2017-08-05 | Discharge: 2017-08-05 | Disposition: A | Discharge status: Home / Self Care | Payer: BLUE CROSS/BLUE SHIELD | Source: Ambulatory Visit | Attending: Obstetrics and Gynecology | Admitting: Obstetrics and Gynecology

## 2017-08-05 ENCOUNTER — Telehealth: Payer: Self-pay | Admitting: *Deleted

## 2017-08-05 ENCOUNTER — Telehealth: Payer: Self-pay | Admitting: Obstetrics and Gynecology

## 2017-08-05 DIAGNOSIS — Z139 Encounter for screening, unspecified: Secondary | ICD-10-CM

## 2017-08-05 DIAGNOSIS — Z1231 Encounter for screening mammogram for malignant neoplasm of breast: Secondary | ICD-10-CM | POA: Diagnosis not present

## 2017-08-05 LAB — TSH: TSH: 1.15 u[IU]/mL (ref 0.450–4.500)

## 2017-08-05 NOTE — Telephone Encounter (Signed)
Spoke with Dr.Nandigam's office on 08/04/2017. Spoke with Judeth CornfieldStephanie who has been notified appointment is for abdominal pain and hemorrhoids. Appointment is scheduled for 09/17/2017. Marland Kitchen. Per Judeth CornfieldStephanie this is the earliest available appointment. Attempted to move patient to earlier appointment per staff patient will be placed on a wait list for cancellations to move appointment forward. Patient is aware. Per review with Dr.Silva okay for patient to have appointment on 09/17/2017. Encounter closed.

## 2017-08-05 NOTE — Telephone Encounter (Signed)
Notes recorded by Leda MinHamm, Ahtziri Jeffries N, RN on 08/05/2017 at 1:51 PM EDT Addendum to previous entry, pacific interpreter [phone]#[id number]. ------  Notes recorded by Leda MinHamm, Maizie Garno N, RN on 08/05/2017 at 1:50 PM EDT Call to patient using pacific interpreter [phone], no answer, unable to leave message, voicemail not set up. Call placed to Daughter "melisa", ok per current dpr, no answer, unable to leave message. ------  Notes recorded by Patton SallesAmundson C Silva, Brook E, MD on 08/05/2017 at 12:30 PM EDT Please report result of thyroid testing which is normal. You may need an interpretor or to contact the patient's husband with results if he is on the DPI.

## 2017-08-05 NOTE — Telephone Encounter (Signed)
Please let Dr. Elana AlmNandigam's office know that patient's referral is for chronic abdominal pain and hemorrhoids. I want to be sure she is seen for the pain.

## 2017-08-08 MED FILL — HYDROCORTISONE AC 25 MG SUP: 25 | 6 days supply | Qty: 12 | Fill #0

## 2017-08-08 MED FILL — NAPROXEN 500 MG TABLET: 500 | 15 days supply | Qty: 30 | Fill #0

## 2017-08-08 MED FILL — metroNIDAZOLE 500 MG TABS: 500 | 7 days supply | Qty: 14 | Fill #0

## 2017-08-08 MED FILL — FLUCONAZOLE 150 MG TABLET: 150 | 1 days supply | Qty: 1 | Fill #0

## 2017-08-11 NOTE — Telephone Encounter (Signed)
Call to patient using pacific interpreter ID [phone], no answer, no option to leave voicemail. Attempted alternative number for daughter "Cristela BlueMellisa", ok per dpr, no answer or option to leave voicemail.

## 2017-08-18 NOTE — Telephone Encounter (Signed)
Dr. Edward JollySilva - I have attempted to contact patient and daughter x2 with interpreter, no return call, please advise?

## 2017-08-18 NOTE — Telephone Encounter (Signed)
Please send a letter to patient informing her that her thyroid testing is normal.

## 2017-08-20 ENCOUNTER — Ambulatory Visit: Payer: BLUE CROSS/BLUE SHIELD | Admitting: Obstetrics and Gynecology

## 2017-08-21 NOTE — Telephone Encounter (Signed)
Letter to Dr. Silva for signature.  

## 2017-08-21 NOTE — Telephone Encounter (Signed)
Certified letter to patient address on file.   Routing to provider for final review. Will close encounter.

## 2017-09-17 ENCOUNTER — Ambulatory Visit: Payer: BLUE CROSS/BLUE SHIELD | Admitting: Gastroenterology

## 2017-11-06 ENCOUNTER — Encounter (INDEPENDENT_AMBULATORY_CARE_PROVIDER_SITE_OTHER): Payer: Self-pay

## 2017-11-06 ENCOUNTER — Encounter: Payer: Self-pay | Admitting: Gastroenterology

## 2017-11-06 ENCOUNTER — Ambulatory Visit (INDEPENDENT_AMBULATORY_CARE_PROVIDER_SITE_OTHER): Payer: BLUE CROSS/BLUE SHIELD | Admitting: Gastroenterology

## 2017-11-06 VITALS — BP 112/62 | HR 67 | Ht 65.0 in | Wt 120.0 lb

## 2017-11-06 DIAGNOSIS — K602 Anal fissure, unspecified: Secondary | ICD-10-CM | POA: Diagnosis not present

## 2017-11-06 DIAGNOSIS — K625 Hemorrhage of anus and rectum: Secondary | ICD-10-CM

## 2017-11-06 DIAGNOSIS — R109 Unspecified abdominal pain: Secondary | ICD-10-CM

## 2017-11-06 DIAGNOSIS — K5909 Other constipation: Secondary | ICD-10-CM | POA: Diagnosis not present

## 2017-11-06 MED ORDER — AMBULATORY NON FORMULARY MEDICATION: 2 refills | Status: DC

## 2017-11-06 NOTE — Progress Notes (Signed)
Maria HooverHuyen Papesh    [id number]    08-22-1966  Primary Care Physician:Gomez, Maura Crandalloger David, PA-C  Referring Physician: Patton SallesAmundson C Silva, Brook E, MD [address] [place], [place] [address]  Chief complaint: Blood in stool, anal discomfort, lower abdominal pain, constipation HPI: 52 year old female [place] accompanied by her daughter is here for new patient visit.  Video interpreter service was used during this visit.  She complains of having blood per rectum, for past 5-6 months, worse when she is constipated and has difficulty evacuating.  She is currently having bowel movement every 2-3 days, mostly hard stool and passes pellets.  She has anal pain/discomfort associated with bowel movement worse when she strains.  Patient also complains of lower abdominal pain in the left lower quadrant also occasional right upper quadrant abdominal pain.   CT abdomen pelvis August 2018 showed probable hemorrhagic right ovarian cyst otherwise negative for any acute GI pathology  No family history of colon cancer or IBD. Denies any dysphagia, odynophagia, vomiting, loss of appetite or weight loss.   Outpatient Encounter Medications as of 11/06/2017  Medication Sig  . Multiple Vitamins-Minerals (MULTIVITAMIN WITH MINERALS) tablet Take 1 tablet by mouth daily.  . naproxen (NAPROSYN) 500 MG tablet Take 1 tablet (500 mg total) by mouth 2 (two) times daily with a meal.  . ondansetron (ZOFRAN) 4 MG tablet Take 4 mg by mouth every 8 (eight) hours as needed for nausea or vomiting.  . polyethylene glycol powder (GLYCOLAX/MIRALAX) powder MIX 1 CAPFUL  17G  IN WITH 8 OZ LIQUID AND DRINK ONCE DAILY  . [DISCONTINUED] hydrocortisone (ANUSOL-HC) 25 MG suppository Place 1 suppository (25 mg total) rectally 2 (two) times daily.  . [DISCONTINUED] senna-docusate (SENOKOT-S) 8.6-50 MG tablet Take 1 tablet by mouth at bedtime.   No facility-administered encounter medications on file as of  11/06/2017.     Allergies as of 11/06/2017  . (No Known Allergies)    Past Medical History:  Diagnosis Date  . Abdominal pain in female    For 3-4 years  . Depression   . Urinary incontinence     Past Surgical History:  Procedure Laterality Date  . HEMORROIDECTOMY      Family History  Problem Relation Age of Onset  . Other Mother        Lung Disease??  . Cirrhosis Father     Social History   Socioeconomic History  . Marital status: Married    Spouse name: Not on file  . Number of children: Not on file  . Years of education: Not on file  . Highest education level: Not on file  Social Needs  . Financial resource strain: Not on file  . Food insecurity - worry: Not on file  . Food insecurity - inability: Not on file  . Transportation needs - medical: Not on file  . Transportation needs - non-medical: Not on file  Occupational History  . Not on file  Tobacco Use  . Smoking status: Never Smoker  . Smokeless tobacco: Never Used  Substance and Sexual Activity  . Alcohol use: No  . Drug use: No  . Sexual activity: Yes    Partners: Male    Birth control/protection: None  Other Topics Concern  . Not on file  Social History Narrative  . Not on file      Review of systems: Review of Systems  Constitutional: Negative for fever and chills.  HENT: Negative.  Eyes: Negative for blurred vision.  Respiratory: Negative for cough, shortness of breath and wheezing.   Cardiovascular: Negative for chest pain and palpitations.  Gastrointestinal: as per HPI Genitourinary: Negative for dysuria, urgency, frequency and hematuria.  Musculoskeletal: Positive for myalgias, back pain and joint pain.  Skin: Negative for itching and rash.  Neurological: Negative for dizziness, tremors, focal weakness, seizures and loss of consciousness.  Endo/Heme/Allergies: Positive for seasonal allergies.  Psychiatric/Behavioral: Negative for depression, suicidal ideas and hallucinations.  Positive for anxiety All other systems reviewed and are negative.   Physical Exam: Vitals:   11/06/17 0900  BP: 112/62  Pulse: 67   Body mass index is 19.97 kg/m. Gen:      No acute distress HEENT:  EOMI, sclera anicteric Neck:     No masses; no thyromegaly Lungs:    Clear to auscultation bilaterally; normal respiratory effort CV:         Regular rate and rhythm; no murmurs Abd:      + bowel sounds; soft, non-tender; no palpable masses, no distension Ext:    No edema; adequate peripheral perfusion Skin:      Warm and dry; no rash Neuro: alert and oriented x 3 Psych: normal mood and affect Rectal exam: Normal anal sphincter tone, no tenderness or external hemorrhoids Anoscopy: Small internal hemorrhoids, anterior anal fissure with no active bleeding, normal dentate line, no visible nodules  Data Reviewed:  Reviewed labs, radiology imaging, old records and pertinent past GI work up   Assessment and Plan/Recommendations: 52 year old female with constipation, intermittent bright red blood per rectum, anal discomfort and abdominal pain.  She has anterior anal fissure on exam Start Benefiber 1 tablespoon 3 times daily with meals MiraLAX 1 capful daily at bedtime Advised patient to apply small pea-sized amount of nitroglycerin 0.125% per rectum 3 times daily for 2 months Will reevaluate in a month to check healing of anal fissure and schedule for colonoscopy    K. Scherry Ran , MD [phone] Mon-Fri 8a-5p [phone] after 5p, weekends, holidays  CC: Ardell Isaacs, Brook*

## 2017-11-06 NOTE — Patient Instructions (Signed)
Use Miralax 1 capful daily with juice or water  Take benefiber 1 tablespoon three times a day  We are giving you a printed prescription of Nitroglycerin ointment to take to [facility]   You have a follow up appointment scheduled with Willette ClusterPaula Guenther, NP on 11/28/2017 at 1:30pm

## 2017-11-28 ENCOUNTER — Encounter: Payer: Self-pay | Admitting: Nurse Practitioner

## 2017-11-28 ENCOUNTER — Ambulatory Visit (INDEPENDENT_AMBULATORY_CARE_PROVIDER_SITE_OTHER): Payer: BLUE CROSS/BLUE SHIELD | Admitting: Nurse Practitioner

## 2017-11-28 VITALS — BP 102/66 | HR 70 | Ht 65.0 in | Wt 124.2 lb

## 2017-11-28 DIAGNOSIS — R102 Pelvic and perineal pain: Secondary | ICD-10-CM | POA: Diagnosis not present

## 2017-11-28 DIAGNOSIS — K5900 Constipation, unspecified: Secondary | ICD-10-CM

## 2017-11-28 DIAGNOSIS — K602 Anal fissure, unspecified: Secondary | ICD-10-CM

## 2017-11-28 MED ORDER — AMBULATORY NON FORMULARY MEDICATION: 0 refills | Status: DC

## 2017-11-28 NOTE — Patient Instructions (Addendum)
If you are age 50 or older, your body mass index should be between 23-30. Your Body mass index is 20.68 kg/m. If this is out of the aforementioned range listed, please consider follow up with your Primary Care Provider.  If you are age 50 or younger, your body mass index should be between 19-25. Your Body mass index is 20.68 kg/m. If this is out of the aformentioned range listed, please consider follow up with your Primary Care Provider.   We have sent the following medications to your pharmacy for you to pick up at your convenience: Nitroglycerin ointment  At [facility].  Use Miralax 1 capful daily in the evening with juice or water  Take Benefiber 1 tablespoon three times daily.  Follow up with Willette ClusterPaula Guenther, NP on December 26, 2017 at 1:30 pm.  Thank you for choosing me and [facility].   Willette ClusterPaula Guenther, NP

## 2017-11-28 NOTE — Progress Notes (Addendum)
      IMPRESSION and PLAN:    1. 52 yo [place] female seen late Jan for constipation / anal fissure. She didn't NTG ointment, fiber nor miralax (see HPI). Still having same sx. She will pick up Rx today and I will see her back in a few weeks.   2. Chronic vaginal pain / complains of discharge. She asks me for a vaginal exam but I explained that it would be a very limited exam since this was GI office and she has a GYN.  Dr. Leone PayorGessner examined her with me. On exam she pointed to left vaginal wall where there was some erythema ? Swelling. White vaginal discharge was also seen.  -gave patient number to her GYN Dr. Edward JollySilva. Given language barrier I will send a staff message to Dr. Edward JollySilva explaining findings and patient will make a follow up   Agree with Ms. Dorice LamasGuenther's assessment and plan. Iva Booparl E. Gessner, MD, [facility]     HPI:    Chief Complaint: rectal pain, constipation   Patient is a 52 year old [place] female who established care with Dr. Lavon PaganiniNandigam late January. She was evaluated at that time for constipation, rectal pain and bleeding. Found to have an anal fissure. She was prescribed Benefiber 1 tablespoon 3 times daily with meals, MiraLAX 1 capful daily at bedtime and nitroglycerin 0.125% per rectum 3 times daily for 2 months. Plan was see in a month and arrange for a colonoscopy.   Patient comes back in having not started ANY of the above medications. Says NTG Rx wasn't at pharmacy and she didn't get a written Rx. She didn't buy the Miralax, nor the fiber. She didn't call office because of language barrier. She is accompanied today by an interpreter. Not having as much rectal pain with defecation as before but still intermittent bleeding. She remains constipated. Her main complaint is that of left side vaginal pain / itching and white discharge. Vaginal symptoms present for 3 years she says. Saw GYN in October, vaginal exam was normal at that time.    Past Medical History:  Diagnosis Date    . Abdominal pain in female    For 3-4 years  . Depression   . Urinary incontinence     Patient's surgical history, family medical history, social history, medications and allergies were all reviewed in Epic    Review of systems: No urinary sx. No fevers. Weight stable  Physical Exam:     BP 102/66   Pulse 70   Ht 5\' 5"  (1.651 m)   Wt 124 lb 4 oz (56.4 kg)   BMI 20.68 kg/m   GENERAL:  Well developed [place] female in NAD PSYCH: :Pleasant, cooperative, normal affect PULM: Normal respiratory effort ABDOMEN:  Nondistended, soft, nontender,  normal bowel sounds Vaginal exam: limited exam. White discharge SKIN:  turgor, no lesions seen Musculoskeletal:  Normal muscle tone, normal strength NEURO: Alert and oriented x 3, no focal neurologic deficits  Willette ClusterPaula Khaalid Lefkowitz , NP 11/28/2017, 1:39 PM

## 2017-11-29 ENCOUNTER — Encounter: Payer: Self-pay | Admitting: Nurse Practitioner

## 2017-11-30 ENCOUNTER — Telehealth: Payer: Self-pay | Admitting: Obstetrics and Gynecology

## 2017-11-30 NOTE — Telephone Encounter (Signed)
Please contact the patient and have her make a follow up appointment with me.

## 2017-11-30 NOTE — Telephone Encounter (Signed)
-----   Message from Meredith PelPaula M Guenther, NP sent at 11/29/2017 11:34 AM EST ----- Hello,  I saw this patient in clinic today for constipation /  rectal fissure. She is [place], came with interpreter. Her main complaint was vaginal discharge and pain. I know you saw her a few months back for pelvic pain. We are treating constipation and rectal fissure which could be part of her pain. Stan Headarl Gessner and I did a very limited vaginal exam in office and she does have some white discharge and Baldo AshCarl thought he may have felt a small polyp just inside vagina??  Anyway, we asked her to follow back up with you.  Thanks,  Willette ClusterPaula Guenther, NP

## 2017-12-01 NOTE — Telephone Encounter (Signed)
Call to patient using [employer] Interpreter ID # [phone]. No answer, unable to leave voicemail, mailbox not set up. Alternative number on DPR for daughter, same number.

## 2017-12-09 NOTE — Telephone Encounter (Signed)
Spoke with patient using [employer] [phone]. Advised calling in f/u to appt with NP at the clinic. Advised as seen below per Dr. Edward JollySilva. Patient states she is already scheduled on 3/15, declines earlier appointment. Advised patient that appt is with a different provider, Dr. Oscar LaJertson, option provided to schedule with Dr. Edward JollySilva, patient again declined. Patient aware to return call to office with any additional questions/concerns.   Routing to provider for final review. Patient is agreeable to disposition. Will close encounter.   Cc: Dr. Oscar LaJertson

## 2017-12-19 ENCOUNTER — Encounter: Payer: Self-pay | Admitting: Obstetrics and Gynecology

## 2017-12-19 ENCOUNTER — Encounter: Payer: BLUE CROSS/BLUE SHIELD | Admitting: Obstetrics and Gynecology

## 2017-12-19 ENCOUNTER — Other Ambulatory Visit: Payer: Self-pay

## 2017-12-19 ENCOUNTER — Ambulatory Visit: Payer: BLUE CROSS/BLUE SHIELD | Admitting: Obstetrics and Gynecology

## 2017-12-19 ENCOUNTER — Other Ambulatory Visit (HOSPITAL_COMMUNITY)
Admission: RE | Admit: 2017-12-19 | Discharge: 2017-12-19 | Disposition: A | Discharge status: Home / Self Care | Payer: BLUE CROSS/BLUE SHIELD | Source: Ambulatory Visit | Attending: Obstetrics and Gynecology | Admitting: Obstetrics and Gynecology

## 2017-12-19 VITALS — BP 116/68 | HR 88 | Resp 16 | Wt 123.0 lb

## 2017-12-19 DIAGNOSIS — M4800 Spinal stenosis, site unspecified: Secondary | ICD-10-CM

## 2017-12-19 DIAGNOSIS — R102 Pelvic and perineal pain: Secondary | ICD-10-CM | POA: Diagnosis not present

## 2017-12-19 DIAGNOSIS — N760 Acute vaginitis: Secondary | ICD-10-CM | POA: Diagnosis not present

## 2017-12-19 LAB — POCT URINALYSIS DIPSTICK
Bilirubin, UA: NEGATIVE
Blood, UA: 1
Glucose, UA: NEGATIVE
Ketones, UA: NEGATIVE
Leukocytes, UA: NEGATIVE
Nitrite, UA: NEGATIVE
Protein, UA: NEGATIVE
Urobilinogen, UA: 0.2 E.U./dL
pH, UA: 5 (ref 5.0–8.0)

## 2017-12-19 NOTE — Progress Notes (Signed)
GYNECOLOGY  VISIT   HPI: 52 y.o.   Married  [place]  female   [phone] with Patient's last menstrual period was 12/02/2017.   here for recheck of abdominal swelling.   Interpretor present for the entire visit.   Seen for first visit on 08/04/17 for chronic RUQ pain and dyspareunia.   Prior CT scan 05/13/17 showing small hemorrhagic cyst and spinal stenosis.  Pelvic US showing small 1.9 cm right ovary cyst.  Seen by GI for constipation/rectal pain and bleeding/anal fissure.  GI examined patient and noted left vaginal wall erythema, discharge.   Today is reporting vulva/vaginal itching versus rectal itching.  Suprapubic pain.   Also has menstrual cycle change.  Monthly but not the same day.  Not heavy. No pain with periods.  Has left sided vaginal pain every day but not during her period.  Also reporting suprapubic pain.   GYNECOLOGIC HISTORY: Patient's last menstrual period was 12/02/2017. Contraception:  none Menopausal hormone therapy:  none Last mammogram:  08/05/17 BIRADS 1 negative/density c Last pap smear:    03-13-17 Negative        OB History    Gravida Para Term Preterm AB Living   2 2 2     2    SAB TAB Ectopic Multiple Live Births                     Patient Active Problem List   Diagnosis Date Noted  . Pelvic pain in female 10/04/2015  . Dyspareunia in female 10/04/2015  . Encounter for routine gynecological examination 10/04/2015  . Abdominal pain, chronic, right lower quadrant 07/27/2015    Past Medical History:  Diagnosis Date  . Abdominal pain in female    For 3-4 years  . Depression   . Urinary incontinence     Past Surgical History:  Procedure Laterality Date  . HEMORROIDECTOMY      Current Outpatient Medications  Medication Sig Dispense Refill  . AMBULATORY NON FORMULARY MEDICATION Nitroglycerin ointment 0.125% Use pea sized amount per rectum for two months three times daily 30 g 0  . omeprazole (PRILOSEC) 20 MG capsule Take 20 mg by  mouth daily.     No current facility-administered medications for this visit.      ALLERGIES: Patient has no known allergies.  Family History  Problem Relation Age of Onset  . Other Mother        Lung Disease??  . Cirrhosis Father     Social History   Socioeconomic History  . Marital status: Married    Spouse name: Not on file  . Number of children: Not on file  . Years of education: Not on file  . Highest education level: Not on file  Social Needs  . Financial resource strain: Not on file  . Food insecurity - worry: Not on file  . Food insecurity - inability: Not on file  . Transportation needs - medical: Not on file  . Transportation needs - non-medical: Not on file  Occupational History  . Not on file  Tobacco Use  . Smoking status: Never Smoker  . Smokeless tobacco: Never Used  Substance and Sexual Activity  . Alcohol use: No  . Drug use: No  . Sexual activity: Yes    Partners: Male    Birth control/protection: None  Other Topics Concern  . Not on file  Social History Narrative  . Not on file    ROS:  Pertinent items are noted in HPI.  PHYSICAL EXAMINATION:    BP 116/68 (BP Location: Right Arm, Patient Position: Sitting, Cuff Size: Normal)   Pulse 88   Resp 16   Wt 123 lb (55.8 kg)   LMP 12/02/2017   BMI 20.47 kg/m     General appearance: alert, cooperative and appears stated age   Abdomen: soft, non-tender, no masses,  no organomegaly   Pelvic: External genitalia:  no lesions              Urethra:  normal appearing urethra with no masses, tenderness or lesions              Bartholins and Skenes: normal                 Vagina: normal appearing vagina with normal color and discharge, no lesions              Cervix: no lesions                Bimanual Exam:  Uterus:  normal size, contour, position, consistency, mobility, non-tender              Adnexa: no mass, fullness, tenderness              Rectal exam: Yes.  .  Confirms.              Anus:   normal sphincter tone, no lesions  Chaperone was present for exam.  ASSESSMENT  Vaginal pain.  No dysmenorrhea.  Hx spinal stenosis.   Pelvic pain, suprapubic.   PLAN  Urine dip.  If positive will do micro and culture. Thin prep for GC/CTtrich/BV/yeast. If testing is negative, she will need to see her PCP regarding her spinal stenosis.  This may be the cause of her vaginal pain.  She declines birth control.   An After Visit Summary was printed and given to the patient.  __25____ minutes face to face time of which over 50% was spent in counseling.

## 2017-12-20 LAB — URINALYSIS, MICROSCOPIC ONLY
Casts: NONE SEEN /lpf
RBC, UA: NONE SEEN /hpf (ref 0–?)

## 2017-12-20 LAB — URINE CULTURE: Organism ID, Bacteria: NO GROWTH

## 2017-12-22 LAB — CERVICOVAGINAL ANCILLARY ONLY
Bacterial vaginitis: NEGATIVE
Candida vaginitis: POSITIVE — AB
Chlamydia: NEGATIVE
Neisseria Gonorrhea: NEGATIVE
Trichomonas: NEGATIVE

## 2017-12-23 ENCOUNTER — Telehealth: Payer: Self-pay | Admitting: *Deleted

## 2017-12-23 NOTE — Telephone Encounter (Signed)
-----   Message from Patton SallesBrook E Amundson C Silva, MD sent at 12/23/2017 10:00 AM EDT ----- Please report results of testing which show vaginal yeast infection.  This can cause vaginal discomfort.  I recommend treatment with Diflucan 150 mg po x 1.  May repeat in 72 hours if needed.  You will need to see her DPI form and need possible interpretor to do this call.

## 2017-12-23 NOTE — Telephone Encounter (Signed)
Notes recorded by Leda MinHamm, Rachael Zapanta N, RN on 12/23/2017 at 1:50 PM EDT Attempted [phone], no answer, voicemail not set up.   Attempted daughter at [phone], "[facility]", ok per dpr, no answer, no option to leave voicemail. ------

## 2017-12-26 ENCOUNTER — Ambulatory Visit (INDEPENDENT_AMBULATORY_CARE_PROVIDER_SITE_OTHER): Payer: BLUE CROSS/BLUE SHIELD | Admitting: Nurse Practitioner

## 2017-12-26 ENCOUNTER — Encounter: Payer: Self-pay | Admitting: Nurse Practitioner

## 2017-12-26 VITALS — BP 98/60 | HR 80 | Ht 59.84 in | Wt 123.0 lb

## 2017-12-26 DIAGNOSIS — Z1211 Encounter for screening for malignant neoplasm of colon: Secondary | ICD-10-CM

## 2017-12-26 DIAGNOSIS — K602 Anal fissure, unspecified: Secondary | ICD-10-CM | POA: Diagnosis not present

## 2017-12-26 MED ORDER — AMBULATORY NON FORMULARY MEDICATION: 0 refills | Status: DC

## 2017-12-26 MED ORDER — NA SULFATE-K SULFATE-MG SULF 17.5-3.13-1.6 GM/177ML PO SOLN: ORAL | 0 refills | Status: DC

## 2017-12-26 NOTE — Patient Instructions (Signed)
If you are age 50 or older, your body mass index should be between 23-30. Your Body mass index is 24.15 kg/m. If this is out of the aforementioned range listed, please consider follow up with your Primary Care Provider.  If you are age 50 or younger, your body mass index should be between 19-25. Your Body mass index is 24.15 kg/m. If this is out of the aformentioned range listed, please consider follow up with your Primary Care Provider.   You have been scheduled for a colonoscopy. Please follow written instructions given to you at your visit today.  Please pick up your prep supplies at the pharmacy within the next 1-3 days. If you use inhalers (even only as needed), please bring them with you on the day of your procedure. Your physician has requested that you go to www.startemmi.com and enter the access code given to you at your visit today. This web site gives a general overview about your procedure. However, you should still follow specific instructions given to you by our office regarding your preparation for the procedure.  We have sent the following medications to your pharmacy for you to pick up at your convenience: Suprep Nitroglycerin ointment  Continue Miralax.  Thank you for choosing me and [facility].   Willette ClusterPaula Guenther, NP

## 2017-12-26 NOTE — Progress Notes (Signed)
      IMPRESSION and PLAN:    #1. 52 yo female with posterior midline fissure, healing with NTG ointment and resolution of constipation. Bleeding resolved. No significant anorectal pain.  -continue daily miralax -complete remaining 2 weeks of NTG treatment then discontinue.   #2. Colon cancer screening. Bleeding was most certainly secondary to anal fissure but will proceed a few months early with her initial colon cancer screening. -Patient will be scheduled for a screening colonoscopy with possible polypectomy to be done in next 4-6 weeks. .  The risks and benefits of the procedure were discussed and the patient agrees to proceed.   HPI:    Chief Complaint: follow up on anal fissure, constipation.    Patient is a 52 yo non-English speaking female who was diagnosed with an anal fissure by Dr. Lavon PaganiniNandigam in late January. She was prescribed fiber, MiraLAX and nitroglycerin ointment.  I saw the patient back late January, there had been some confusion with the prescribed medications and she had not started any of them.  Of course her symptoms were unchanged at that time.  Patient comes in for follow-up.  She is now taking the MiraLAX and using nitroglycerin ointment 3 times a day.  Her rectal pain has resolved.  No rectal bleeding.  Bowels moving well on miralax. She is very happy with the results of the medication.  Her bowels are moving well on MiraLAX.  At the time of her visit with me in late February patient complained of vaginal discharge and left-sided vaginal pain present for 3 years.  I looked at the area and there was some mild erythema may be swelling.   I recommended patient follow-up with her GYN, Dr. Edward JollySilva.  I sent Dr. Edward JollySilva a message informing her of my findings and patient's concerns and she had her office make patient an appt. Patient tells me that GYN exam was negative however.  Since she is so pleased with resolution of rectal pain patient is hoping that I can help her with vaginal  complaints as well. I explained to her that this was not my area of expertise.     Review of systems:  No urinary sx. No chest pain. No SOB.      Past Medical History:  Diagnosis Date  . Abdominal pain in female    For 3-4 years  . Depression   . Urinary incontinence     Patient's surgical history, family medical history, social history, medications and allergies were all reviewed in Epic    Physical Exam:     BP 98/60   Pulse 80   Ht 4' 11.84" (1.52 m) Comment: w/o shoes  Wt 123 lb (55.8 kg)   LMP 12/02/2017   BMI 24.15 kg/m   GENERAL:  Well developed Asian female in NAD PSYCH: :Pleasant, cooperative, normal affect EENT:  conjunctiva pink, mucous membranes moist, neck supple without masses CARDIAC:  RRR, no murmur heard, no peripheral edema PULM: Normal respiratory effort, lungs CTA bilaterally, no wheezing ABDOMEN:  Nondistended, soft, nontender. No obvious masses, no hepatomegaly,  normal bowel sounds Rectal : no external lesions. On DRE she has very mild tenderness of posterior midline.  SKIN:  turgor, no lesions seen Musculoskeletal:  Normal muscle tone, normal strength NEURO: Alert and oriented x 3, no focal neurologic deficits   Willette ClusterPaula Guenther , NP 12/26/2017, 1:42 PM

## 2017-12-29 ENCOUNTER — Encounter: Payer: Self-pay | Admitting: Nurse Practitioner

## 2017-12-29 NOTE — Progress Notes (Signed)
Reviewed and agree with documentation and assessment and plan. K. Veena Deeann Servidio , MD   

## 2017-12-29 NOTE — Telephone Encounter (Signed)
Call placed using [facility] [phone].   Attempted [phone], no answer, voicemail not set up, unable to leave message.   Attempted daughter, "Cristela BlueMellisa", ok per dpr, no answer, voicemail not set up, unable to leave message.

## 2018-01-05 NOTE — Telephone Encounter (Signed)
Please send a letter with copy of result showing yeast infection. She can treat with over the counter Monistat 3 per vagina.

## 2018-01-05 NOTE — Telephone Encounter (Signed)
Dr. Edward JollySilva, unable to reach x2, no return call. Attempted daughter on dpr, no return call. Please advise?

## 2018-01-07 NOTE — Telephone Encounter (Signed)
Letter to Dr.Silva for review. °

## 2018-01-08 NOTE — Telephone Encounter (Signed)
Letter signed, placed in outgoing mail. Will close encounter.

## 2018-01-13 ENCOUNTER — Telehealth: Payer: Self-pay | Admitting: Obstetrics and Gynecology

## 2018-01-13 NOTE — Telephone Encounter (Signed)
Patient's daughter, Cristela BlueMellisa, calling to see if her mother needed to see the doctor again after recent procedure on 12/19/17.

## 2018-01-13 NOTE — Telephone Encounter (Signed)
Attempted to reach Melissa x 3 at number provided [phone], okay per ROI. Recording states that the wireless customer you have reached is not available.

## 2018-01-14 NOTE — Telephone Encounter (Signed)
Attempted to reach patient and sister at number provided [phone], there was no answer and no voicemail box.

## 2018-01-14 NOTE — Telephone Encounter (Signed)
Patient's sister called with patient to [place] call [place]. Patient's sister states that her daughter does not speak english well enough to translate.

## 2018-01-16 NOTE — Telephone Encounter (Signed)
Spoke with patient's sister and patient. State they received letter regarding positive yeast infection and need for treatment with Monistat. Patient has not yet started this. Advised will need to use OTC Monistat 3 for treatment. Advised if symptoms and discomfort resolve does not need follow up. If symptoms persist will need follow up appointment in the office.   Routing to provider for final review. Patient agreeable to disposition. Will close encounter.

## 2018-02-04 ENCOUNTER — Encounter: Payer: Self-pay | Admitting: Obstetrics and Gynecology

## 2018-02-04 ENCOUNTER — Other Ambulatory Visit: Payer: Self-pay

## 2018-02-04 ENCOUNTER — Ambulatory Visit: Payer: BLUE CROSS/BLUE SHIELD | Admitting: Obstetrics and Gynecology

## 2018-02-04 VITALS — BP 108/56 | HR 72 | Resp 14 | Ht 60.0 in | Wt 117.0 lb

## 2018-02-04 DIAGNOSIS — M4800 Spinal stenosis, site unspecified: Secondary | ICD-10-CM

## 2018-02-04 DIAGNOSIS — N898 Other specified noninflammatory disorders of vagina: Secondary | ICD-10-CM

## 2018-02-04 DIAGNOSIS — R102 Pelvic and perineal pain: Secondary | ICD-10-CM

## 2018-02-04 MED ORDER — NORETHINDRONE 0.35 MG PO TABS: 1.0000 | ORAL_TABLET | Freq: Every day | ORAL | 2 refills | Status: DC

## 2018-02-04 NOTE — Patient Instructions (Signed)
He?p ?ng s?ng Spinal Stenosis H?p ?ng s?ng x?y ra khi kho?ng khng gian m? (?ng s?ng) gi?a cc x??ng c?t s?ng (??t s?ng) h?p l?i, gy chn p ln t?y s?ng ho?c dy th?n kinh t?y s?ng. Nguyn nhn g gy ra? Nguyn nhn c?a tnh tr?ng ny l do cc vng x??ng ??y vo cc ?ng trung tm c?a ??t s?ng. Tnh tr?ng ny c th? t?n t?i lc sinh (b?m sinh), ho?c c th? do:  Thoi ha ??t s?ng do vim kh?p (thoi ha c?t s?ng). Tnh tr?ng ny th??ng b?t ??u ? kho?ng ?? tu?i 52.  T?n th??ng ho?c ch?n th??ng c?t s?ng.  Kh?i u trong c?t s?ng.  L?ng ??ng canxi trong c?t s?ng.  Cc d?u hi?u ho?c tri?u ch?ng l g? Nh?ng tri?u ch?ng c?a tnh tr?ng ny bao g?m:  ?au ? c? ho?c l?ng, nhn chung l tr?m tr?ng h?n khi ho?t ??ng, ??c bi?t l khi ??ng v ?i b?.  T, ?au bu?t, c?m gic nng ho?c l?nh, y?u, ho?c m?i ? chn.  C?n ?au lan ln v xu?ng chn (?au th?n kinh t?a).  Nh?ng ??t t ng th??ng xuyn.  Dng ?i bn chn r? d?n ??n y?u c?.  Trong nh?ng tr??ng h?p nghim tr?ng h?n, qu v? c th? g?p ph?i:  V?n ?? v? ??i ti?n ho?c ti?u ti?n.  Kh quan h? tnh d?c.  M?t c?m gic m?t ph?n ho?c ton b? chn.  Cc tri?u ch?ng c th? xu?t hi?n t? t? v tr? nn tr?m tr?ng h?n theo th?i gian. Ch?n ?on tnh tr?ng ny nh? th? no? Tnh tr?ng ny c th? ???c ch?n ?on d?a vo khai thc b?nh s? v khm th?c th?. Cc ki?m tra c?ng s? ???c th?c hi?n, ch?ng h?n nh?:  Ch?p MRI.  Ch?p CT.  Ch?p X quang.  Tnh tr?ng ny ???c ?i?u tr? nh? th? no? ?i?u tr? tnh tr?ng ny th??ng t?p trung vo vi?c x? tr ?au v b?t k? tri?u ch?ng no khc. ?i?u tr? c th? bao g?m:  Th?c hnh t? th? t?t ?? gi?m b?t p l?c ln cc dy th?n kinh.  T?p luy?n ?? t?ng c??ng c?, xy d?ng s?c ch?u ??ng, c?i thi?n tnh tr?ng th?ng b?ng v duy tr v?n ??ng kh?p t?t (ph?m vi v?n ??ng).  Gi?m cn, n?u c?n.  Dng thu?c ?? gi?m s?ng, gi?m vim ho?c gi?m ?au.  D?ng c? h? tr?, ch?ng h?n th?t l?ng ho?c ?ai ?eo.  Trong m?t s? tr??ng  h?p c th? c?n ph?i ph?u thu?t. Th? thu?t ph? bi?n nh?t l c?t b?n s?ng lo?i b? chn p. Th? thu?t ny ???c th?c hi?n ?? lo?i b? x??ng th?a chn p ln r? th?n kinh c?a qu v?. Tun th? nh?ng h??ng d?n ny ? nh: X? tr ?au, c?ng kh?p v s?ng n?  T?p t?t c? cc bi th? d?c v ko gin theo ch? d?n c?a chuyn gia ch?m [place] s?c kh?e.  Th?c hnh t? th? t?t. N?u qu v? ???c dng ?ai ?eo ho?c th?t l?ng, hy s?? du?ng theo ch? d?n c?a chuyn gia ch?m [place] s?c kh?e.  Khng th?c hi?n b?t k? ho?t ??ng no gy ?au. H?i chuyn gia ch?m [place] s?c kh?e v? cc ho?t ??ng no an ton cho qu v?.  Khng nng b?t ky? v?t gi? n??ng qu 10 lb (4,5 kg) ho?c qu gi?i h?n theo ch? d?n c?a chuyn gia ch?m so?c s??c kho?e.  Duy tr m??c cn n?ng c l?i cho s?c kh?e. Trao ??i  v?i chuyn gia ch?m [place] s?c kh?e c?a qu v? n?u qu v? c?n gip gi?m cn.  N?u ???c chi? d?n, ha?y ch???m no?ng va?o vu?ng bi? a?nh h??ng th??ng xuyn nh? chi? d?n cu?a chuyn gia ch?m so?c s??c kho?e. S? d?ng ngu?n nhi?t m chuyn gia ch?m [place] s?c kh?e khuy?n ngh?, ch?ng h?n nh? ti ch??m nhi?t ?m ho?c ??m ch??m nng. ? ?? kh?n t?m ? gi?a da v ngu?n nhi?t. ? Duy tr ngu?n nhi?t trong 20-30 pht. ? B? ngu?n nhi?t ra n?u da qu v? chuy?n sang mu ?? nh?t. ?i?u ny ??c bi?t quan tr?ng n?u qu v? khng th? c?m th?y ?au, nng, hay l?nh. Qu v? c th? c nguy c? b? b?ng cao h?n. H??ng d?n chung  Ch? s? d?ng thu?c khng k ??n v thu?c k ??n theo ch? d?n c?a chuyn gia ch?m [place] s?c kh?e.  Khng s? d?ng b?t k? s?n ph?m no ch?a nicotine ho?c thu?c l, ch?ng ha?n nh? thu?c l d?ng ht v thu?c l ?i?n t?. N?u qu v? c?n gip ?? ?? cai thu?c, hy h?i chuyn gia ch?m [place] s?c kh?e.  p d?ng ch? ?? ?n u?ng kh?e m?nh. Ch? ?? ny bao g?m nhi?u tri cy v rau c?, ng? c?c nguyn h?t v protein t bo (n?c).  Tun th? t?t c? cc l?n khm theo di theo ch? d?n c?a chuyn gia ch?m [place] s?c kh?e. ?i?u ny c vai tr quan tr?ng. Hy lin l?c v?i chuyn gia ch?m  [place] s?c kh?e n?u:  Cc tri?u ch?ng c?a qu v? khng ?? h?n ho?c tr? nn tr?m tr?ng h?n.  Qu v? b? s?t. Yu c?u tr? gip ngay l?p t?c n?u:  Qu v? b? c?n ?au m?i ho?c ?au tr?m tr?ng h?n ? c? ho?c l?ng trn.  Qu v? b? ?au r?t nhi?u khng ki?m sot ???c b?ng thu?c.  Qu v? b? chng m?t.  Qu v? c v?n ?? v? th? l?c, nhn m?, ho?c nhn m?t thnh hai.  Qu v? b? ?au ??u r?t nhi?u v tr?m tr?ng h?n khi qu v? ??ng.  Qu v? b? bu?n nn ho?c qu v? nn.  Qu v? b? t b ho?c ?au nhi m?i xu?t hi?n ho?c tr?m tr?ng h?n ? l?ng ho?c ? chn.  Qu v? b? ?au, t?y ??, s?ng, ho?c ?m ? chn ho?c cnh tay. Tm t?t  H?p ?ng s?ng x?y ra khi kho?ng khng gian m? (?ng s?ng) gi?a cc x??ng c?t s?ng (??t s?ng) h?p l?i. Tnh tr?ng h?p ny gy chn p ln t?y s?ng ho?c dy th?n kinh t?y s?ng.  H?p ?ng s?ng c th? gy ra t b, y?u, ho?c ?au ? c?, l?ng v chn.  Nguyn nhn gy ra tnh tr?ng ny c th? do d? t?t b?m sinh, thoi ha ??t s?ng do vim kh?p, ch?n th??ng, kh?i u, ho?c l?ng ??ng canxi.  Tnh tr?ng ny th??ng ???c ch?n ?on b?ng ch?p MRI, ch?p CT v ch?p X-quang. Thng tin ny khng nh?m m?c ?ch thay th? cho l?i khuyn m chuyn gia ch?m [place] s?c kh?e ni v?i qu v?. Hy b?o ??m qu v? ph?i th?o lu?n b?t k? v?n ?? g m qu v? c v?i chuyn gia ch?m [place] s?c kh?e c?a qu v?. Document Released: 01/09/2017 Document Revised: 01/09/2017 Document Reviewed: 01/09/2017 Elsevier Interactive Patient Education  2018 [employer].  Norethindrone tablets (contraception) ?y l thu?c g? NORETHINDRONE l thu?c ng?a thai d?ng u?ng. Ch? ph?m c ch?a n?i ti?t t? n? g?i  l progestin. N ???c dng ?? ng?a [place]. Thu?c ny c th? ???c dng cho nh?ng m?c ?ch khc; hy h?i ng??i cung c?p d?ch v? y t? ho?c d??c s? c?a mnh, n?u qu v? c th?c m?c. (CC) NHN HI?U PH? BI?N: Camila, Deblitane 28-Day, Errin, Heather, Odin, Jolivette, Germania, Nor-QD, Nora-BE, Norlyroc, Ortho Micronor, Sharobel 28-Day Ti c?n ph?i  bo cho ng??i cung c?p d?ch v? y t? c?a mnh ?i?u g tr??c khi dng thu?c ny? H? c?n bi?t li?u qu v? hi?n c b?t k? tnh tr?ng no sau ?y hay khng: -b?nh m?ch mu ho?c c?c mu ?ng -ung th? v, c? t? cung, ho?c m ??o -b?nh ti?u ???ng -b?nh tim -b?nh th?n -b?nh gan -tr?m c?m -ch?ng ?au n?a ??u -co gi?t -??t qu? -xu?t huy?t m ??o -pha?n ??ng b?t th???ng ho??c di? ??ng v??i norethindrone -pha?n ??ng b?t th???ng ho??c di? ??ng v??i ca?c d??c ph?m kha?c -pha?n ??ng b?t th???ng ho??c di? ??ng v??i th??c ph?m, thu?c nhu?m, ho??c ch?t ba?o qua?n -?ang c thai ho??c ??nh co? thai -?ang cho con bu? Ti nn s? d?ng thu?c ny nh? th? no? U?ng thu?c ny v?i m?t ly n??c. Qu v? c th? u?ng thu?c ny cng ho?c khng cng v?i th?c ?n. Hy lm theo cc h??ng d?n trn h?p thu?c ho?c nhn thu?c. Dng thu?c ny vo cng m?t th?i ?i?m m?i ngy v theo trnh t? nh? ? ???c ch? d?n trn bao thu?c. Khng ???c dng thu?c ny nhi?u l?n h?n ? ???c ch? d?n. Hy bn v?i bc s? nhi khoa c?a qu v? v? vi?c dng thu?c ny ? tr? em. C th? c?n ch?m [place] ??c bi?t. Thu?c ny ? ???c dng ? nh?ng b gi ? b?t ??u c kinh nguy?t. T? thng tin dnh cho b?nh nhn s? ???c cung c?p cho t?ng toa thu?c v cho m?i l?n mua thm thu?c. Hy ??c k? t? thng tin ny m?i l?n. T? thng tin c th? thay ??i th??ng xuyn. Qu li?u: N?u qu v? cho r?ng mnh ? dng qu nhi?u thu?c ny, th hy lin l?c v?i trung tm ki?m sot ch?t ??c ho?c phng c?p c?u ngay l?p t?c. L?U : Thu?c ny ch? dnh ring cho qu v?. Khng chia s? thu?c ny v?i nh?ng ng??i khc. N?u ti l? qun m?t li?u th sao? C? g?ng ??ng b? l? m?t li?u thu?c no. M?i khi qun m?t li?u thu?c ho?c dng tr? m?t li?u thu?c, th kh? n?ng c thai c?a qu v? gia t?ng. Khi qun 1 vin thu?c (ngay c? khi ch? tr? 3 gi? ??ng h?), th hy u?ng vin thu?c b? l? qun cng s?m cng t?t v ti?p t?c u?ng m?t vin m?i ngy vo nh?ng th?i gian ??u ??n (s? d?ng m?t bi?n php ng?a  thai d? phng trong 48 gi? ??ng h? ti?p theo). N?u qun nhi?u h?n 1 li?u thu?c, th hy dng thm m?t bi?n php ng?a [place] b? sung trong th?i gian u?ng ph?n cn l?i c?a v? thu?c cho t?i khi th?y kinh nguy?t. Hy lin l?c v?i bc s? ho?c [place] vin y t? c?a mnh, n?u qu v? l? qun nhi?u h?n 1 li?u thu?c. Nh?ng g c th? t??ng tc v?i thu?c ny? Khng ???c dng thu?c ny cng v?i b?t k? th? no sau ?y: -amprenavir ho?c fosamprenavir -bosentan Thu?c ny c?ng c th? t??ng tc v?i cc thu?c sau ?y: -cc thu?c khng sinh ho?c thu?c dng ?? tr? cc b?nh nhi?m trng, ??c bi?t l rifampin,  rifabutin, rifapentine, v griseofulvin, v c th? l cc thu?c nhm penicillin ho?c tetracycline -aprepitant -cc thu?c nhm barbiturate, ch?ng h?n nh? phenobarbital -carbamazepine -felbamate -modafinil -oxcarbazepine -phenytoin -m?t s? thu?c dng ?? tr? nhi?m HIV ho?c AIDS, ch?ng h?n nh? ritonavir -cy [facility] W[facility] (c? [facility]/cy n?c s?i/cy l?nh) -topiramate Danh sch ny c th? khng m t? ?? h?t cc t??ng tc c th? x?y ra. Hy ??a cho ng??i cung c?p d?ch v? y t? c?a mnh danh sch t?t c? cc thu?c, th?o d??c, cc thu?c khng c?n toa, ho?c cc ch? ph?m b? sung m qu v? dng. C?ng nn bo cho h? bi?t r?ng qu v? c ht thu?c, u?ng r??u, ho?c c s? d?ng ma ty tri php hay khng. Vi th? c th? t??ng tc v?i thu?c c?a qu v?. Ti c?n ph?i theo di ?i?u g trong khi dng thu?c ny? Hy ?i g?p bc s? ho?c [place] vin y t? ?? theo di ??nh k? s? c?i thi?n c?a qu v?. Qu v? s? c?n khm v v vng x??ng ch?u v lm phi?n ?? m ??o (Pap smear) ??nh k? trong khi dng thu?c ny. Hy s? d?ng m?t bi?n php ng?a [place] b? sung trong k? kinh ??u tin khi qu v? u?ng thu?c ny. N?u qu v? c b?t k? l do no ?? ngh? r?ng qu v? ?ang c [place], th hy ng?ng dng thu?c ny ngay l?p t?c v lin l?c v?i bc s? ho?c chuyn vin y t?. N?u qu v? ?ang dng thu?c ny ?? ?i?u tr? cho v?n ?? lin quan ??n n?i ti?t t?, th c th?  m?t vi chu k? s? d?ng m?i th?y c?i thi?n tnh tr?ng c?a mnh. Thu?c ny khng b?o v? cho qu v? kh?i b? nhi?m HIV ho?c AIDS ho?c cc b?nh ly qua ???ng tnh d?c khc. Ti c th? nh?n th?y nh?ng tc d?ng ph? no khi dng thu?c ny? Nh?ng tc d?ng ph? qu v? c?n ph?i bo cho bc s? ho?c chuyn vin y t? cng s?m cng t?t: - ?m ? v ho?c xu?t ti?t -?au ? b?ng, ng?c, hng, ho?c chn -?au ??u d? d?i -da b? n?i ban, ng?a, ho?c n?i my ?ay -kh th? ??t ng?t -m?t m?i ho?c y?u ?t b?t th??ng -cc v?n ?? v? th? l?c v ni n?ng -vng da ho?c m?t Cc tc d?ng ph? khng c?n ph?i ch?m [place] y t? (hy bo cho bc s? ho?c chuyn vin y t?, n?u cc tc d?ng ph? ny ti?p di?n ho?c gy phi?n toi): -thay ??i ham mu?n tnh d?c -thay ??i l??ng mu hnh kinh -m?c lng trn m?t -b? gi? n??c v s?ng -?au ??u -d? cu k?nh -bu?n i -t?ng cn ho?c s?t cn Danh sch ny c th? khng m t? ?? h?t cc tc d?ng ph? c th? x?y ra. Xin g?i t?i bc s? c?a mnh ?? ???c c? v?n chuyn mn v? cc tc d?ng ph?Ladell Heads v? c th? t??ng trnh cc tc d?ng ph? cho FDA theo s? [phone]. Ti nn c?t gi? thu?c c?a mnh ? ?u? ?? ngoi t?m tay tr? em. C?t gi? ? nhi?t ?? phng t? 15 ??n 30 ?? C (61 ??n 88 ?? F). V?t b? t?t c? thu?c ch?a dng sau ngy h?t h?n in trn nhn thu?c ho?c bao thu?c. L?U : ?y l b?n tm t?t. N c th? khng bao hm t?t c? thng tin c th? c. N?u qu v? th?c m?c v? thu?c ny, xin trao ??i  v?i bc s?, d??c s?, ho?c ng??i cung c?p d?ch v? y t? c?a mnh.  2018 Elsevier/Gold Standard (2016-10-24 00:00:00)   Pelvic Pain, Female Pelvic pain is pain in your lower abdomen, below your belly button and between your hips. The pain may start suddenly (acute), keep coming back (recurring), or last a long time (chronic). Pelvic pain that lasts longer than six months is considered chronic. Pelvic pain may affect your:  Reproductive organs.  Urinary system.  Digestive tract.  Musculoskeletal system.  There  are many potential causes of pelvic pain. Sometimes, the pain can be a result of digestive or urinary conditions, strained muscles or ligaments, or even reproductive conditions. Sometimes the cause of pelvic pain is not known. Follow these instructions at home:  Take over-the-counter and prescription medicines only as told by your health care provider.  Rest as told by your health care provider.  Do not have sex it if hurts.  Keep a journal of your pelvic pain. Write down: ? When the pain started. ? Where the pain is located. ? What seems to make the pain better or worse, such as food or your menstrual cycle. ? Any symptoms you have along with the pain.  Keep all follow-up visits as told by your health care provider. This is important. Contact a health care provider if:  Medicine does not help your pain.  Your pain comes back.  You have new symptoms.  You have abnormal vaginal discharge or bleeding, including bleeding after menopause.  You have a fever or chills.  You are constipated.  You have blood in your urine or stool.  You have foul-smelling urine.  You feel weak or lightheaded. Get help right away if:  You have sudden severe pain.  Your pain gets steadily worse.  You have severe pain along with fever, nausea, vomiting, or excessive sweating.  You lose consciousness. This information is not intended to replace advice given to you by your health care provider. Make sure you discuss any questions you have with your health care provider. Document Released: 08/20/2004 Document Revised: 10/18/2015 Document Reviewed: 07/14/2015 Elsevier Interactive Patient Education  2018 [employer].

## 2018-02-04 NOTE — Progress Notes (Signed)
GYNECOLOGY  VISIT   HPI: 52 y.o.   Married  [place]  female   [id number] with Patient's last menstrual period was 01/12/2018.   here for pain in lower abdomen and vagina, dark yellow discharge with odor.  Daughter is here to help interpret.   Had candida noted on 12/19/17.  Negative GC/CT/trichomonas at that time.   Did Monistat treatment and is still having pain in the left vagina and in lower abdomen.    Hx chronic abdominal pain.   No personal hx of HTN, liver disease, migraine headache, DVT or PE. Not a smoker.  Never been told she should avoid hormones.   When she is angry, she feels feels chest discomfort.  Spinal stenosis and disc protrusion on prior CT imaging August 2018.   GYNECOLOGIC HISTORY: Patient's last menstrual period was 01/12/2018. Contraception:  none Menopausal hormone therapy:  none Last mammogram:  08/05/17 BIRADS 1 negative/density c Last pap smear:   03/13/17 Pap smear Negative        OB History    Gravida  2   Para  2   Term  2   Preterm      AB      Living  2     SAB      TAB      Ectopic      Multiple      Live Births                 Patient Active Problem List   Diagnosis Date Noted  . Spinal stenosis 12/19/2017  . Pelvic pain in female 10/04/2015  . Dyspareunia in female 10/04/2015  . Encounter for routine gynecological examination 10/04/2015  . Abdominal pain, chronic, right lower quadrant 07/27/2015    Past Medical History:  Diagnosis Date  . Abdominal pain in female    For 3-4 years  . Depression   . Urinary incontinence     Past Surgical History:  Procedure Laterality Date  . HEMORROIDECTOMY      Current Outpatient Medications  Medication Sig Dispense Refill  . AMBULATORY NON FORMULARY MEDICATION Nitroglycerin ointment 0.125% Use pea sized amount per rectum for two months three times daily 30 g 0  . Na Sulfate-K Sulfate-Mg Sulf 17.5-3.13-1.6 GM/177ML SOLN Suprep-Use as directed 354 mL 0  . omeprazole  (PRILOSEC) 20 MG capsule Take 20 mg by mouth daily.     No current facility-administered medications for this visit.      ALLERGIES: Patient has no known allergies.  Family History  Problem Relation Age of Onset  . Other Mother        Lung Disease??  . Cirrhosis Father     Social History   Socioeconomic History  . Marital status: Married    Spouse name: Not on file  . Number of children: Not on file  . Years of education: Not on file  . Highest education level: Not on file  Occupational History  . Not on file  Social Needs  . Financial resource strain: Not on file  . Food insecurity:    Worry: Not on file    Inability: Not on file  . Transportation needs:    Medical: Not on file    Non-medical: Not on file  Tobacco Use  . Smoking status: Never Smoker  . Smokeless tobacco: Never Used  Substance and Sexual Activity  . Alcohol use: No  . Drug use: No  . Sexual activity: Yes    Partners: Male  Birth control/protection: None  Lifestyle  . Physical activity:    Days per week: Not on file    Minutes per session: Not on file  . Stress: Not on file  Relationships  . Social connections:    Talks on phone: Not on file    Gets together: Not on file    Attends religious service: Not on file    Active member of club or organization: Not on file    Attends meetings of clubs or organizations: Not on file    Relationship status: Not on file  . Intimate partner violence:    Fear of current or ex partner: Not on file    Emotionally abused: Not on file    Physically abused: Not on file    Forced sexual activity: Not on file  Other Topics Concern  . Not on file  Social History Narrative  . Not on file    ROS:  Pertinent items are noted in HPI.  PHYSICAL EXAMINATION:    BP (!) 108/56 (BP Location: Right Arm, Patient Position: Sitting, Cuff Size: Normal)   Pulse 72   Resp 14   Ht 5' (1.524 m)   Wt 117 lb (53.1 kg)   LMP 01/12/2018   BMI 22.85 kg/m     General  appearance: alert, cooperative and appears stated age   Pelvic: External genitalia:  no lesions              Urethra:  normal appearing urethra with no masses, tenderness or lesions              Bartholins and Skenes: normal                 Vagina: normal appearing vagina with normal color and discharge, no lesions              Cervix: no lesions                Bimanual Exam:  Uterus:  normal size, contour, position, consistency, mobility, non-tender              Adnexa: no mass, fullness, tenderness                Chaperone was present for exam.  ASSESSMENT  Chronic pelvic and abdominal pain.  Hx dyspareunia.  Nonspecific chest pain with anxiety.  Spinal stenosis and disc protrusion.  Orthopedic problems as an etiology for referred pelvic pain discussed. Recent yeast infection.   PLAN  We discussed pelvic pain and possible endometriosis.  Will do a trial of Micronor x 3 months.  Instructed in use.   Affirm vaginitis testing.  Refer to Dr. Charlett Blake for spinal stenosis, disc herniation.   Fu in 3 months.    An After Visit Summary was printed and given to the patient.  __25____ minutes face to face time of which over 50% was spent in counseling.

## 2018-02-04 NOTE — Progress Notes (Signed)
Patient scheduled while in office, daughter Maria Garrison present. Patient scheduled with Dr. Clovis Riley on 02/19/18 arriving at 9:15am for 9:30 am appointment. Provided patient and daughter with contact information. Verbalizes understanding and is agreeable.

## 2018-02-05 ENCOUNTER — Telehealth: Payer: Self-pay | Admitting: *Deleted

## 2018-02-05 LAB — VAGINITIS/VAGINOSIS, DNA PROBE
Candida Species: NEGATIVE
Gardnerella vaginalis: NEGATIVE
Trichomonas vaginosis: NEGATIVE

## 2018-02-05 NOTE — Telephone Encounter (Signed)
-----   Message from Patton Salles, MD sent at 02/05/2018  2:24 PM EDT ----- Please inform patient of her negative vaginitis testing.  See DPI for assistance due to language barrier.  I have started her on OCPs for pelvic pain and referred her to Dr. Charlett Blake for spinal stenosis and pelvic pain, which may be referred due to her spinal condition.

## 2018-02-05 NOTE — Telephone Encounter (Signed)
Notes recorded by Leda Min, RN on 02/05/2018 at 4:02 PM EDT Unable to reach. Attempted home number, no answer. Attempted daughter Cristela Blue, ok per dpr, no answer, unable to leave voicemail, mailbox not set up". ------

## 2018-02-13 NOTE — Telephone Encounter (Signed)
No answer, no option to leave voicemail.

## 2018-02-18 NOTE — Telephone Encounter (Signed)
Please mail the test results to the patient.  Thank you.

## 2018-02-18 NOTE — Telephone Encounter (Signed)
Letter printed and to Dr. Edward Jolly for review and signature.

## 2018-02-18 NOTE — Telephone Encounter (Signed)
Letter with 02/04/18 vaginitis results mailed standard mail. Will close encounter.

## 2018-02-18 NOTE — Telephone Encounter (Signed)
Letter pended

## 2018-02-18 NOTE — Telephone Encounter (Signed)
Dr. Luis Abed to contact patient and daughter, no return call, would you like a copy of negative vaginitis testing results mailed?

## 2018-02-19 DIAGNOSIS — M48061 Spinal stenosis, lumbar region without neurogenic claudication: Secondary | ICD-10-CM | POA: Diagnosis not present

## 2018-02-24 ENCOUNTER — Ambulatory Visit (AMBULATORY_SURGERY_CENTER): Payer: BLUE CROSS/BLUE SHIELD | Admitting: Gastroenterology

## 2018-02-24 ENCOUNTER — Encounter: Payer: Self-pay | Admitting: Gastroenterology

## 2018-02-24 ENCOUNTER — Other Ambulatory Visit: Payer: Self-pay

## 2018-02-24 VITALS — BP 93/63 | HR 71 | Temp 97.1°F | Resp 16 | Ht 59.0 in | Wt 123.0 lb

## 2018-02-24 DIAGNOSIS — Z1211 Encounter for screening for malignant neoplasm of colon: Secondary | ICD-10-CM | POA: Diagnosis not present

## 2018-02-24 DIAGNOSIS — K921 Melena: Secondary | ICD-10-CM | POA: Diagnosis present

## 2018-02-24 DIAGNOSIS — K625 Hemorrhage of anus and rectum: Secondary | ICD-10-CM

## 2018-02-24 MED ORDER — SODIUM CHLORIDE 0.9 % IV SOLN
500.0000 mL | Freq: Once | INTRAVENOUS | Status: DC
Start: 2018-02-24 — End: 2022-12-09

## 2018-02-24 NOTE — Progress Notes (Signed)
To PACU, VSS. Report to [email or web address] 

## 2018-02-24 NOTE — Patient Instructions (Signed)
Take Benefiber (found in drug stores) 1 Tablespoon three times per day with meals.  YOU HAD AN ENDOSCOPIC PROCEDURE TODAY AT THE [facility]:   Refer to the procedure report that was given to you for any specific questions about what was found during the examination.  If the procedure report does not answer your questions, please call your gastroenterologist to clarify.  If you requested that your care partner not be given the details of your procedure findings, then the procedure report has been included in a sealed envelope for you to review at your convenience later.  YOU SHOULD EXPECT: Some feelings of bloating in the abdomen. Passage of more gas than usual.  Walking can help get rid of the air that was put into your GI tract during the procedure and reduce the bloating. If you had a lower endoscopy (such as a colonoscopy or flexible sigmoidoscopy) you may notice spotting of blood in your stool or on the toilet paper. If you underwent a bowel prep for your procedure, you may not have a normal bowel movement for a few days.  Please Note:  You might notice some irritation and congestion in your nose or some drainage.  This is from the oxygen used during your procedure.  There is no need for concern and it should clear up in a day or so.  SYMPTOMS TO REPORT IMMEDIATELY:   Following lower endoscopy (colonoscopy or flexible sigmoidoscopy):  Excessive amounts of blood in the stool  Significant tenderness or worsening of abdominal pains  Swelling of the abdomen that is new, acute  Fever of 100F or higher  For urgent or emergent issues, a gastroenterologist can be reached at any hour by calling [phone].   DIET:  We do recommend a small meal at first, but then you may proceed to your regular diet.  Drink plenty of fluids but you should avoid alcoholic beverages for 24 hours.  ACTIVITY:  You should plan to take it easy for the rest of today and you should NOT DRIVE or use heavy  machinery until tomorrow (because of the sedation medicines used during the test).    FOLLOW UP: Our staff will call the number listed on your records the next business day following your procedure to check on you and address any questions or concerns that you may have regarding the information given to you following your procedure. If we do not reach you, we will leave a message.  However, if you are feeling well and you are not experiencing any problems, there is no need to return our call.  We will assume that you have returned to your regular daily activities without incident.  If any biopsies were taken you will be contacted by phone or by letter within the next 1-3 weeks.  Please call us at [phone] if you have not heard about the biopsies in 3 weeks.    SIGNATURES/CONFIDENTIALITY: You and/or your care partner have signed paperwork which will be entered into your electronic medical record.  These signatures attest to the fact that that the information above on your After Visit Summary has been reviewed and is understood.  Full responsibility of the confidentiality of this discharge information lies with you and/or your care-partner.

## 2018-02-24 NOTE — Progress Notes (Signed)
Interpreter used today at the [facility] for this pt.  Interpreter's name is- [employer].  Pt states she feels very exhausted and has a headache this a.m.  She states she is a "little dizzy".  IV fluid running wide open.  Vinnie Langton CRNA made aware.

## 2018-02-24 NOTE — Op Note (Signed)
[facility] Patient Name: Maria Garrison Procedure Date: 02/24/2018 7:55 AM MRN: [id number] Endoscopist: Napoleon Form , MD Age: 52 Referring MD:  Date of Birth: October 19, 1965 Gender: Female Account #: [id number] Procedure:                Colonoscopy Indications:              Evaluation of unexplained GI bleeding Medicines:                Monitored Anesthesia Care Procedure:                Pre-Anesthesia Assessment:                           - Prior to the procedure, a History and Physical                            was performed, and patient medications and                            allergies were reviewed. The patient's tolerance of                            previous anesthesia was also reviewed. The risks                            and benefits of the procedure and the sedation                            options and risks were discussed with the patient.                            All questions were answered, and informed consent                            was obtained. Prior Anticoagulants: The patient has                            taken no previous anticoagulant or antiplatelet                            agents. ASA Grade Assessment: II - A patient with                            mild systemic disease. After reviewing the risks                            and benefits, the patient was deemed in                            satisfactory condition to undergo the procedure.                           After obtaining informed consent, the colonoscope  was passed under direct vision. Throughout the                            procedure, the patient's blood pressure, pulse, and                            oxygen saturations were monitored continuously. The                            Colonoscope was introduced through the anus and                            advanced to the the cecum, identified by                            appendiceal orifice and  ileocecal valve. The                            colonoscopy was performed without difficulty. The                            patient tolerated the procedure well. The quality                            of the bowel preparation was excellent. The                            ileocecal valve, appendiceal orifice, and rectum                            were photographed. Scope In: 8:21:24 AM Scope Out: 8:34:45 AM Scope Withdrawal Time: 0 hours 9 minutes 26 seconds  Total Procedure Duration: 0 hours 13 minutes 21 seconds  Findings:                 The perianal and digital rectal examinations were                            normal.                           A few small-mouthed diverticula were found in the                            sigmoid colon, descending colon and ascending colon.                           Non-bleeding internal hemorrhoids were found during                            retroflexion. The hemorrhoids were small.                           healed anal fissure, no nodules or mass lesions in  the rectum. Normal appearing mucosa. Complications:            No immediate complications. Estimated Blood Loss:     Estimated blood loss: none. Impression:               - Mild diverticulosis in the sigmoid colon, in the                            descending colon and in the ascending colon.                           - Non-bleeding internal hemorrhoids.                           - No specimens collected. Recommendation:           - Patient has a contact number available for                            emergencies. The signs and symptoms of potential                            delayed complications were discussed with the                            patient. Return to normal activities tomorrow.                            Written discharge instructions were provided to the                            patient.                           - Resume previous diet.                            - Continue present medications.                           - Repeat colonoscopy in 10 years for screening                            purposes.                           - Benefiber 1 tablespoon TID with meals Napoleon Form, MD 02/24/2018 8:44:12 AM This report has been signed electronically.

## 2018-02-25 ENCOUNTER — Telehealth: Payer: Self-pay | Admitting: *Deleted

## 2018-02-25 ENCOUNTER — Telehealth: Payer: Self-pay

## 2018-02-25 NOTE — Telephone Encounter (Signed)
Attempted first follow up call.  No answer.  Will call again in the afternoon.

## 2018-02-25 NOTE — Telephone Encounter (Signed)
No answer second call, unable to leave message

## 2018-04-02 ENCOUNTER — Encounter: Payer: Self-pay | Admitting: Family Medicine

## 2018-04-02 ENCOUNTER — Ambulatory Visit: Payer: BLUE CROSS/BLUE SHIELD | Admitting: Family Medicine

## 2018-04-02 VITALS — BP 96/66 | HR 66 | Temp 98.1°F | Ht 60.0 in | Wt 117.0 lb

## 2018-04-02 DIAGNOSIS — Z0000 Encounter for general adult medical examination without abnormal findings: Secondary | ICD-10-CM | POA: Diagnosis not present

## 2018-04-02 DIAGNOSIS — R1310 Dysphagia, unspecified: Secondary | ICD-10-CM

## 2018-04-02 DIAGNOSIS — R1011 Right upper quadrant pain: Secondary | ICD-10-CM

## 2018-04-02 DIAGNOSIS — Z131 Encounter for screening for diabetes mellitus: Secondary | ICD-10-CM | POA: Diagnosis not present

## 2018-04-02 DIAGNOSIS — Z8349 Family history of other endocrine, nutritional and metabolic diseases: Secondary | ICD-10-CM

## 2018-04-02 LAB — COMPREHENSIVE METABOLIC PANEL
ALT: 11 U/L (ref 0–35)
AST: 18 U/L (ref 0–37)
Albumin: 4.1 g/dL (ref 3.5–5.2)
Alkaline Phosphatase: 84 U/L (ref 39–117)
BUN: 16 mg/dL (ref 6–23)
CO2: 33 mEq/L — ABNORMAL HIGH (ref 19–32)
Calcium: 9.2 mg/dL (ref 8.4–10.5)
Chloride: 102 mEq/L (ref 96–112)
Creatinine, Ser: 0.77 mg/dL (ref 0.40–1.20)
GFR: 84.35 mL/min (ref >60.00)
Glucose, Bld: 79 mg/dL (ref 70–99)
Potassium: 4.1 mEq/L (ref 3.5–5.1)
Sodium: 139 mEq/L (ref 135–145)
Total Bilirubin: 0.4 mg/dL (ref 0.2–1.2)
Total Protein: 7 g/dL (ref 6.0–8.3)

## 2018-04-02 LAB — CBC WITH DIFFERENTIAL/PLATELET
Basophils Absolute: 0 10*3/uL (ref 0.0–0.1)
Basophils Relative: 0.6 % (ref 0.0–3.0)
Eosinophils Absolute: 0.1 10*3/uL (ref 0.0–0.7)
Eosinophils Relative: 1.4 % (ref 0.0–5.0)
HCT: 38.6 % (ref 36.0–46.0)
Hemoglobin: 13 g/dL (ref 12.0–15.0)
Lymphocytes Relative: 36.2 % (ref 12.0–46.0)
Lymphs Abs: 1.4 10*3/uL (ref 0.7–4.0)
MCHC: 33.6 g/dL (ref 30.0–36.0)
MCV: 87.8 fl (ref 78.0–100.0)
Monocytes Absolute: 0.4 10*3/uL (ref 0.1–1.0)
Monocytes Relative: 8.9 % (ref 3.0–12.0)
Neutro Abs: 2.1 10*3/uL (ref 1.4–7.7)
Neutrophils Relative %: 52.9 % (ref 43.0–77.0)
Platelets: 269 10*3/uL (ref 150.0–400.0)
RBC: 4.4 Mil/uL (ref 3.87–5.11)
RDW: 13.2 % (ref 11.5–15.5)
WBC: 3.9 10*3/uL — ABNORMAL LOW (ref 4.0–10.5)

## 2018-04-02 LAB — HEMOGLOBIN A1C: Hgb A1c MFr Bld: 5.5 % (ref 4.6–6.5)

## 2018-04-02 LAB — TSH: TSH: 0.6 u[IU]/mL (ref 0.35–4.50)

## 2018-04-02 LAB — T4, FREE: Free T4: 1 ng/dL (ref 0.60–1.60)

## 2018-04-02 NOTE — Progress Notes (Signed)
Patient presents to clinic today to f/u and establish care.  Pt is accompanied by a [place] interpreter.  SUBJECTIVE: PMH: Patient is a 52 year old female with no significant past medical.  Pt endorses feeling weak x 1 month, feeling like it is hard to swallow, and abdominal pain.  Pt cannot associate the abd pain with certain foods.  She states it can just happen at any time.  The pain is noted as sharp, intermittent.  Pt denies n/v, fever, chills.  Pt has been seen by GI and OB/Gyn in the past for similar complaint.  Per chart review possible endometriosis, referred for spinal stenosis and disc herniation.  Pt cannot associate her dysphagia with solids or liquids.  Pt notes a family h/o thyroid problems and would like her thyroid checked.  Allergies: NKDA  Social hx: Pt is married.  She works as a Advertising account plannernail technician.  Pt denies EtOH, tobacco, or drug use.   Health Maintenance: Colonoscopy -- 02/24/18 next one in 10 yrs  Past Medical History:  Diagnosis Date  . Abdominal pain in female    For 3-4 years  . Heart murmur   . Urinary incontinence     Past Surgical History:  Procedure Laterality Date  . HEMORROIDECTOMY      Current Outpatient Medications on File Prior to Visit  Medication Sig Dispense Refill  . AMBULATORY NON FORMULARY MEDICATION Nitroglycerin ointment 0.125% Use pea sized amount per rectum for two months three times daily 30 g 0  . norethindrone (MICRONOR,CAMILA,ERRIN) 0.35 MG tablet Take 1 tablet (0.35 mg total) by mouth daily. 1 Package 2  . omeprazole (PRILOSEC) 20 MG capsule Take 20 mg by mouth daily.     Current Facility-Administered Medications on File Prior to Visit  Medication Dose Route Frequency Provider Last Rate Last Dose  . 0.9 %  sodium chloride infusion  500 mL Intravenous Once Nandigam, Eleonore ChiquitoKavitha V, MD        No Known Allergies  Family History  Problem Relation Age of Onset  . Other Mother        Lung Disease??  . Cirrhosis Father   . Colon  cancer Neg Hx   . Colon polyps Neg Hx   . Esophageal cancer Neg Hx     Social History   Socioeconomic History  . Marital status: Married    Spouse name: Not on file  . Number of children: Not on file  . Years of education: Not on file  . Highest education level: Not on file  Occupational History  . Not on file  Social Needs  . Financial resource strain: Not on file  . Food insecurity:    Worry: Not on file    Inability: Not on file  . Transportation needs:    Medical: Not on file    Non-medical: Not on file  Tobacco Use  . Smoking status: Never Smoker  . Smokeless tobacco: Never Used  Substance and Sexual Activity  . Alcohol use: No  . Drug use: No  . Sexual activity: Yes    Partners: Male    Birth control/protection: None  Lifestyle  . Physical activity:    Days per week: Not on file    Minutes per session: Not on file  . Stress: Not on file  Relationships  . Social connections:    Talks on phone: Not on file    Gets together: Not on file    Attends religious service: Not on file    Active  member of club or organization: Not on file    Attends meetings of clubs or organizations: Not on file    Relationship status: Not on file  . Intimate partner violence:    Fear of current or ex partner: Not on file    Emotionally abused: Not on file    Physically abused: Not on file    Forced sexual activity: Not on file  Other Topics Concern  . Not on file  Social History Narrative  . Not on file    ROS General: Denies fever, chills, night sweats, changes in weight, changes in appetite  +weakness HEENT: Denies headaches, ear pain, changes in vision, rhinorrhea  +sore throat CV: Denies CP, palpitations, SOB, orthopnea Pulm: Denies SOB, cough, wheezing GI: Denies nausea, vomiting, diarrhea, constipation  +abdominal pain GU: Denies dysuria, hematuria, frequency, vaginal discharge Msk: Denies muscle cramps, joint pains Neuro: Denies weakness, numbness, tingling Skin:  Denies rashes, bruising Psych: Denies depression, anxiety, hallucinations  BP 96/66 (BP Location: Left Arm, Patient Position: Sitting, Cuff Size: Normal)   Pulse 66   Temp 98.1 F (36.7 C) (Oral)   Ht 5' (1.524 m)   Wt 117 lb (53.1 kg)   LMP 01/28/2018 (Approximate)   SpO2 98%   BMI 22.85 kg/m   Physical Exam Gen. Pleasant, well developed, well-nourished, in NAD HEENT - /AT, PERRL, no scleral icterus, no nasal drainage, pharynx without erythema or exudate.  TMs normal bilaterally.  No cervical lymphadenopathy. Neck: No JVD, no thyromegaly Lungs: no use of accessory muscles, CTAB, no wheezes, rales or rhonchi Cardiovascular: RRR, No r/g/m, no peripheral edema Abdomen: BS present, soft, nontender,nondistended, no hepatosplenomegaly Neuro:  A&Ox3, CN II-XII intact, normal gait Skin:  Warm, dry, intact, no lesions  Recent Results (from the past 2160 hour(s))  Vaginitis/Vaginosis, DNA Probe     Status: None   Collection Time: 02/04/18  2:32 PM  Result Value Ref Range   Candida Species Negative Negative   Gardnerella vaginalis Negative Negative   Trichomonas vaginosis Negative Negative    Assessment/Plan: Well adult exam  -Anticipatory guidance given including wearing seatbelts, smoke detectors in the home, increasing physical activity, increasing p.o. intake of water and vegetables. -will obtain labs -next CPE in 1 yr - Plan: CBC with Differential/Platelet  Right upper quadrant abdominal pain  - Plan: Comprehensive metabolic panel -lab reviewed and norma.  Will start trial of protonix.  Screening for diabetes mellitus  - Plan: Hemoglobin A1c  Dysphagia, unspecified type  - Plan: TSH, T4, Free  Family history of thyroid problem -  Plan: TSH, T4, Free  F/u prn in the next month  Abbe Amsterdam, MD

## 2018-04-02 NOTE — Patient Instructions (Signed)
Ch?m [place] phng ng?a 40-64 tu?i, N? gi?i Preventive Care 40-64 Years, Female Ch?m [place] phng ng?a ?? c?p ??n cc l?a ch?n l?i s?ng v th?m khm v?i chuyn gia ch?m [place] s?c kh?e c th? t?ng c??ng s?c kh?e v h?nh phc. Ch?m [place] phng ng?a bao g?m nh?ng g?  Khm s?c kh?e hng n?m. L?n khm ny c?ng ???c g?i l ki?m tra s?c kh?e hng n?m.  Khm nha khoa m?i n?m m?t ho?c hai l?n.  Khm m?t ??nh k?. Hy h?i chuyn gia ch?m [place] s?c kh?e v? vi?c bao lu th qu v? nn khm m?t m?t l?n.  Cc l?a ch?n l?i s?ng c nhn, bao g?m: ? Ch?m [place] r?ng v l?i hng ngy. ? Hoa?t ??ng th? ch?t th???ng xuyn. ? ?n ch? ?? ?n lnh m?nh. ? Hessie Diener s? d?ng thu?c l v ma ty. ? H?n ch? s? d?ng r??u. ? Quan h? tnh d?c an ton. ? Dng aspirin li?u th?p hng ngy b?t ??u ? tu?i 50. ? Dng th?c ph?m b? sung vitamin v ch?t khong theo ch? d?n c?a chuyn gia ch?m [place] s?c kh?e. Nh?ng g x?y ra trong khi ki?m tra s?c kh?e hng n?m? Cc d?ch v? v xt nghi?m sng l?c do chuyn gia ch?m [place] s?c kh?e th?c hi?n trong khi khm s?c kh?e hng n?m s? ph? thu?c vo tu?i, s?c kh?e t?ng th?, cc y?u t? nguy c? t? l?i s?ng v b?nh s? gia ?nh c?a qu v?. T? v?n [place] gia ch?m [place] s?c kh?e c th? h?i qu v? v? vi?c:  S? d?ng r??u.  S? d?ng thu?c l.  S? d?ng ma ty.  Tnh tr?ng s?c kh?e tinh th?n.  Tnh tr?ng h?nh phc ? nh v trong m?i quan h?.  Quan h? tnh d?c.  Thi quen ?n u?ng.  Cng vi?c v mi tr??ng lm vi?c.  Ph??ng php ng?a [place].  Chu k? kinh nguy?t.  L?ch s? mang thai.  Sng l?c Qu v? c th? ???c lm cc xt nghi?m ho?c php ?o sau:  Chi?u cao, cn n?ng v BMI.  Huy?t p.  N?ng ?? lipid v cholesterol. L??ng cc ch?t ny c th? ???c ki?m tra m?i 5 n?m m?t l?n ho?c th??ng xuyn h?n n?u qu v? trn [place].  Sng l?c ung th? ph?i. Qu v? c th? ???c lm xt nghi?m sng l?c ny m?i n?m b?t ??u ? tu?i 55 n?u qu v? c ti?n s? ht 30 gi thu?c m?t n?m v hi?n ?ang ht thu?c ho?c ?  b? thu?c trong vng 15 n?m tr??c.  Xt nghi?m mu ?n trong phn (FOBT). Qu v? c th? ???c lm xt nghi?m ny m?i n?m b?t ??u ? tu?i 50.  N?i soi ??i trng ho?c n?i soi ??i trng sigma ?ng m?m. Qu v? c th? ???c n?i soi ??i trng sigma m?i 5 n?m m?t l?n ho?c n?i soi ??i trng m?i 10 n?m m?t l?n b?t ??u ? tu?i 50.  Xt nghi?m mu tm vim gan C.  Xt nghi?m mu tm vim gan B.  Xt nghi?m b?nh ly truy?n qua ???ng tnh d?c (STD).  Sng l?c ti?u ???ng. Xt nghi?m ny ???c th?c hi?n b?ng cch ki?m tra ???ng huy?t (glucose) sau khi qu v? khng ?n g trong m?t kho?ng th?i gian (nh?n ?i). Qu v? c th? ???c lm xt nghi?m ny m?i 1-3 n?m m?t l?n.  Ch?p X quang tuy?n v. Xt nghi?m ny c th? ???c th?c hi?n m?i 1-2  n?m m?t l?n. Hy trao ??i v?i chuyn gia ch?m [place] s?c kh?e v? vi?c khi no qu v? nn b?t ??u ch?p X quang tuy?n v hng n?m. ?i?u ny c th? ph? thu?c vo vi?c qu v? c ti?n s? gia ?nh b? ung th? v hay khng.  Sng l?c ung th? lin quan ??n BRCA. Xt nghi?m ny c th? ???c th?c hi?n n?u qu v? c ti?n s? gia ?nh b? ung th? v, bu?ng tr?ng, ?ng d?n tr?ng ho?c ung th? phc m?c.  Khm khung ch?u v xt nghi?m ph?t t? bo c? t? cung (Pap). Nh?ng xt nghi?m ny c th? ???c th?c hi?n m?i 3 n?m m?t l?n b?t ??u t? tu?i 21. B?t ??u ? tu?i 30, xt nghi?m ny c th? ???c th?c hi?n m?i 5 n?m m?t l?n n?u qu v? ???c lm xt nghi?m Pap k?t h?p v?i xt nghi?m HPV.  Ch?p m?t ?? x??ng. Xt nghi?m ny ???c th?c hi?n ?? sng l?c b?nh long x??ng. Qu v? c th? ???c ch?p m?t ?? x??ng n?u c nguy c? cao b? long x??ng.  Hy th?o lu?n v?i chuyn gia ch?m [place] s?c kh?e cu?a qu v? v? cc k?t qu? xt nghi?m, cc ph??ng n ?i?u tr? v n?u c?n, nhu c?u lm thm xt nghi?m. V?c xin Chuyn gia ch?m [place] s?c kh?e c th? khuy?n ngh? m?t s? v?c xin nh?t ??nh, ch?ng h?n nh?:  V?c xin cm. V?c xin ny ???c khuy?n ngh? hng n?m.  V?c xin u?n vn, b?ch h?u v ho g v bo (Tdap, Td). Qu v? c th? c?n m?t li?u Td  nh?c l?i m?i 10 n?m m?t l?n.  V?c xin th?y ??u. Qu v? c th? c?n v?c xin ny n?u ch?a ???c chch ng?a.  V?c xin zoster. Qu v? c th? c?n v?c xin ny sau tu?i 60.  V?c xin s?i, quai b? v rubella (MMR). Qu v? c th? c?n t nh?t m?t li?u MMR n?u sinh ra vo n?m 1957 ho?c sau ?Sander Nephew v? c?ng c?n li?u th? hai.  V?c xin lin h?p ph? c?u khu?n 13 gi (PCV13). Qu v? c th? c?n v?c xin ny n?u c m?t s? tnh tr?ng b?nh l nh?t ??nh v tr??c ?y ch?a ???c chch ng?a.  V?c xin ph? c?u khu?n polysaccharide (PPSV23). Qu v? c th? c?n m?t ho?c hai li?u n?u qu v? ht thu?c ho?c n?u qu v? c m?t s? tnh tr?ng b?nh l nh?t ??nh.  V?c xin vim mng no. Qu v? c th? c?n v?c xin ny n?u c cc tnh tr?ng nh?t ??nh.  V?c xin vim gan A. Qu v? c th? c?n v?c xin ny n?u c m?t s? tnh tr?ng nh?t ??nh ho?c n?u qu v? ?i du l?ch ho?c lm vi?c ? nh?ng n?i m qu v? c th? b? ph?i nhi?m vim gan A.  V?c xin vim gan B. Qu v? c th? c?n v?c xin ny n?u c m?t s? tnh tr?ng b?nh l nh?t ??nh ho?c n?u qu v? ?i du l?ch ho?c lm vi?c ? nh?ng n?i m qu v? c th? b? ph?i nhi?m vim gan B.  V?c xin Haemophilus influenzae tup b (Hib). Qu v? c th? c?n v?c xin ny n?u c cc tnh tr?ng nh?t ??nh.  Hy trao ??i v?i chuyn gia ch?m  s?c kh?e v? nh?ng xt nghi?m sng l?c v v?c xin no m qu v? c?n v bao lu th c?n cc v?c xin ? m?t l?n. Thng tin  ny khng nh?m m?c ?ch thay th? cho l?i khuyn m chuyn gia ch?m [place] s?c kh?e ni v?i qu v?. Hy b?o ??m qu v? ph?i th?o lu?n b?t k? v?n ?? g m qu v? c v?i chuyn gia ch?m [place] s?c kh?e c?a qu v?. Document Released: 01/09/2017 Document Revised: 01/09/2017 Elsevier Interactive Patient Education  2018 [employer].  ?au b?ng, Ng??i l?n Abdominal Pain, Adult Nhi?u nguyn nhn c th? gy ra ?au b?ng. ?au b?ng th??ng khng nghim tr?ng v ?? m khng c?n ph?i ?i?u tr? ho?c khi ???c ?i?u tr? t?i nh. Tuy nhin, ?i khi ?au b?ng l nghim tr?ng. Chuyn gia  ch?m [place] s?c kh?e c?a qu v? s? khai thc b?nh s? v khm th?c th? ?? xc ??nh nguyn nhn gy ra ?au b?ng. Tun th? nh?ng h??ng d?n ny ? nh:  Ch? s? d?ng thu?c khng k ??n v thu?c k ??n theo ch? d?n c?a chuyn gia ch?m [place] s?c kh?e. Khng dng thu?c nhu?n trng tr? khi ???c chuyn gia ch?m [place] s?c kh?e c?a qu v? ch? d?n.  U?ng ?? n??c ?? gi? cho n??c ti?u trong ho?c c mu vng nh?t.  Theo di tnh tr?ng c?a qu v? ?? pht hi?n b?t k? thay ??i no.  Tun th? t?t c? cc cu?c h?n khm l?i theo ch? d?n c?a chuyn gia ch?m [place] s?c kh?e. ?i?u ny c vai tr quan tr?ng. Hy lin l?c v?i chuyn gia ch?m [place] s?c kh?e n?u:  Tnh tr?ng ?au b?ng c?a qu v? thay ??i ho?c tr?m tr?ng h?n.  Qu v? khng ?i ho?c qu v? b? s?t cn m khng c? g?ng gi?m cn.  Qu v? b? to bn ho?c b? tiu ch?y qu 2-3 ngy.  Qu v? b? ?au khi ?i ti?u ho?c ?i ngoi.  ?au b?ng khi?n qu v? t?nh gi?c vo ban ?m.  ?au tr? nn tr?m tr?ng h?n khi ?n, sau khi ?n, ho?c khi ?n m?t s? th?c ph?m nh?t ??nh.  Qu v? nn v khng th? km l?i ???c.  Qu v? b? s?t. Yu c?u tr? gip ngay l?p t?c n?u:  C?n ?au c?a qu v? khng h?t s?m theo nh? d? ki?n c?a chuyn gia ch?m [place] s?c kh?e c?a qu v?.  Qu v? v?n ti?p t?c b? nn.  Ch? c?m th?y ?au ? cc vng c?a b?ng, ch?ng h?n nh? ? ph?n b?ng d??i bn ph?i ho?c bn tri.  Quy? vi? ?i ngoi phn c ma?u ho??c phn ma?u ?en, ho??c phn trng nh? h??c i?n.  Qu v? b? ?au r?t nhi?u, co th?t ho??c ch???ng b?ng.  Qu v? c cc d?u hi?u m?t n??c, ch?ng h?n nh?: ? N??c ti?u s?m mu, r?t t n??c ti?u, ho?c khng c n??c ti?u. ? Mi n?t n?. ? Mi?ng kh. ? M?t tr?ng. ? Bu?n ng?. ? Y?u. Thng tin ny khng nh?m m?c ?ch thay th? cho l?i khuyn m chuyn gia ch?m [place] s?c kh?e ni v?i qu v?. Hy b?o ??m qu v? ph?i th?o lu?n b?t k? v?n ?? g m qu v? c v?i chuyn gia ch?m [place] s?c kh?e c?a qu v?. Document Released: 09/23/2005 Document Revised: 09/12/2016 Document Reviewed:  03/06/2016 Elsevier Interactive Patient Education  2018 [employer].

## 2018-04-06 ENCOUNTER — Encounter: Payer: Self-pay | Admitting: Family Medicine

## 2018-04-06 MED ORDER — PANTOPRAZOLE SODIUM 40 MG PO TBEC: 40.0000 mg | DELAYED_RELEASE_TABLET | Freq: Every day | ORAL | 3 refills | Status: DC

## 2018-05-25 DIAGNOSIS — M48061 Spinal stenosis, lumbar region without neurogenic claudication: Secondary | ICD-10-CM | POA: Diagnosis not present

## 2018-05-28 DIAGNOSIS — M545 Low back pain: Secondary | ICD-10-CM | POA: Diagnosis not present

## 2018-07-16 ENCOUNTER — Other Ambulatory Visit: Payer: Self-pay | Admitting: Neurological Surgery

## 2018-07-16 DIAGNOSIS — M5416 Radiculopathy, lumbar region: Secondary | ICD-10-CM | POA: Diagnosis not present

## 2018-07-20 ENCOUNTER — Ambulatory Visit
Admission: RE | Admit: 2018-07-20 | Discharge: 2018-07-20 | Disposition: A | Discharge status: Home / Self Care | Payer: BLUE CROSS/BLUE SHIELD | Source: Ambulatory Visit | Attending: Neurological Surgery | Admitting: Neurological Surgery

## 2018-07-20 DIAGNOSIS — M5126 Other intervertebral disc displacement, lumbar region: Secondary | ICD-10-CM | POA: Diagnosis not present

## 2018-07-20 DIAGNOSIS — M5416 Radiculopathy, lumbar region: Secondary | ICD-10-CM

## 2018-07-20 MED ORDER — METHYLPREDNISOLONE ACETATE 40 MG/ML INJ SUSP (RADIOLOG
120.0000 mg | Freq: Once | INTRAMUSCULAR | Status: AC
  Administered 2018-07-20: 120 mg via EPIDURAL

## 2018-07-20 MED ORDER — IOPAMIDOL (ISOVUE-M 200) INJECTION 41%
1.0000 mL | Freq: Once | INTRAMUSCULAR | Status: AC
  Administered 2018-07-20: 1 mL via EPIDURAL

## 2018-07-20 NOTE — Discharge Instructions (Signed)

## 2019-06-03 ENCOUNTER — Other Ambulatory Visit: Payer: Self-pay | Admitting: Obstetrics and Gynecology

## 2019-06-03 DIAGNOSIS — Z1231 Encounter for screening mammogram for malignant neoplasm of breast: Secondary | ICD-10-CM

## 2019-06-22 DIAGNOSIS — Z23 Encounter for immunization: Secondary | ICD-10-CM | POA: Diagnosis not present

## 2019-07-01 DIAGNOSIS — M2550 Pain in unspecified joint: Secondary | ICD-10-CM | POA: Diagnosis not present

## 2019-07-08 DIAGNOSIS — M79641 Pain in right hand: Secondary | ICD-10-CM | POA: Diagnosis not present

## 2019-07-08 DIAGNOSIS — M2550 Pain in unspecified joint: Secondary | ICD-10-CM | POA: Diagnosis not present

## 2019-07-08 DIAGNOSIS — M25569 Pain in unspecified knee: Secondary | ICD-10-CM | POA: Diagnosis not present

## 2019-07-16 ENCOUNTER — Other Ambulatory Visit: Payer: Self-pay

## 2019-07-16 ENCOUNTER — Ambulatory Visit
Admission: RE | Admit: 2019-07-16 | Discharge: 2019-07-16 | Disposition: A | Discharge status: Home / Self Care | Payer: Self-pay | Source: Ambulatory Visit | Attending: Obstetrics and Gynecology | Admitting: Obstetrics and Gynecology

## 2019-07-16 ENCOUNTER — Other Ambulatory Visit: Payer: Self-pay | Admitting: Family Medicine

## 2019-07-16 DIAGNOSIS — N63 Unspecified lump in unspecified breast: Secondary | ICD-10-CM

## 2019-07-16 DIAGNOSIS — Z1231 Encounter for screening mammogram for malignant neoplasm of breast: Secondary | ICD-10-CM

## 2019-07-21 ENCOUNTER — Ambulatory Visit (INDEPENDENT_AMBULATORY_CARE_PROVIDER_SITE_OTHER): Payer: BLUE CROSS/BLUE SHIELD | Admitting: Primary Care

## 2019-07-29 ENCOUNTER — Encounter: Payer: Self-pay | Admitting: Family Medicine

## 2019-07-29 ENCOUNTER — Other Ambulatory Visit: Payer: Self-pay

## 2019-07-29 ENCOUNTER — Ambulatory Visit: Payer: BC Managed Care – PPO | Admitting: Family Medicine

## 2019-07-29 ENCOUNTER — Other Ambulatory Visit (HOSPITAL_COMMUNITY)
Admission: RE | Admit: 2019-07-29 | Discharge: 2019-07-29 | Disposition: A | Discharge status: Home / Self Care | Payer: BC Managed Care – PPO | Source: Ambulatory Visit | Attending: Family Medicine | Admitting: Family Medicine

## 2019-07-29 VITALS — BP 102/80 | HR 72 | Temp 98.1°F | Wt 119.0 lb

## 2019-07-29 DIAGNOSIS — N760 Acute vaginitis: Secondary | ICD-10-CM | POA: Diagnosis not present

## 2019-07-29 DIAGNOSIS — N941 Unspecified dyspareunia: Secondary | ICD-10-CM

## 2019-07-29 DIAGNOSIS — R102 Pelvic and perineal pain: Secondary | ICD-10-CM

## 2019-07-29 DIAGNOSIS — K5900 Constipation, unspecified: Secondary | ICD-10-CM | POA: Diagnosis not present

## 2019-07-29 LAB — POCT URINALYSIS DIPSTICK
Bilirubin, UA: NEGATIVE
Blood, UA: NEGATIVE
Glucose, UA: NEGATIVE
Ketones, UA: NEGATIVE
Leukocytes, UA: NEGATIVE
Nitrite, UA: NEGATIVE
Protein, UA: NEGATIVE
Spec Grav, UA: 1.01 (ref 1.010–1.025)
Urobilinogen, UA: 0.2 E.U./dL
pH, UA: 7 (ref 5.0–8.0)

## 2019-07-29 NOTE — Progress Notes (Signed)
Subjective:    Patient ID: Maria Garrison, female    DOB: 1966/07/06, 53 y.o.   MRN: [id number]  No chief complaint on file. Patient's daughter present during visit.  Wish to act as interpreter.  Advised to sign release form.  HPI Patient was seen today for acute concern.  Pt with suprapubic pain x 3 wks, dysuria, dysparuenia, frequency. Pt also notes decreased volume when trying to urinate.  Pt denies vaginal d/c or itching. LMP maybe May.  Pt notes from Dec 2019 to March had spotting instead of a regular period.  Pt drinking 5 bottles of water per day when not at work. Drinking 2 bottles per day when at work.  Pt endorses h/o hemorrhoids and constipation at times.  Pt seen in 2018 by OB/GYN for similar concerns.  Underwent pelvic ultrasound.  Past Medical History:  Diagnosis Date  . Abdominal pain in female    For 3-4 years  . Heart murmur   . Urinary incontinence     No Known Allergies  ROS General: Denies fever, chills, night sweats, changes in weight, changes in appetite HEENT: Denies headaches, ear pain, changes in vision, rhinorrhea, sore throat CV: Denies CP, palpitations, SOB, orthopnea Pulm: Denies SOB, cough, wheezing GI: Denies nausea, vomiting, diarrhea, constipation  + abdominal pain GU: Denies hematuria, vaginal discharge  + dyspareunia, dysuria, urinary frequency, decreased urine volume Msk: Denies muscle cramps, joint pains Neuro: Denies weakness, numbness, tingling Skin: Denies rashes, bruising Psych: Denies depression, anxiety, hallucinations    Objective:    Blood pressure 102/80, pulse 72, temperature 98.1 F (36.7 C), temperature source Oral, weight 119 lb (54 kg), last menstrual period 01/27/2019, SpO2 98 %.   Gen. Pleasant, well-nourished, in no distress, normal affect   HEENT: [place]/AT, face symmetric, conjunctiva clear, no scleral icterus, PERRLA, EOMI, nares patent without drainage Lungs: no accessory muscle use, CTAB, no wheezes or rales Cardiovascular: RRR,  no m/r/g, no peripheral edema Abdomen: BS present, soft, TTP in RLQ and LLQ, no hepatosplenomegaly. GU: normal external female genitalia.  No lesions noted.  No discharge noted.  Normal-appearing introitus. Aptima swab collected. Musculoskeletal: No deformities, no cyanosis or clubbing, normal tone Neuro:  A&Ox3, CN II-XII intact, normal gait Skin:  Warm, no lesions/ rash   Wt Readings from Last 3 Encounters:  07/29/19 119 lb (54 kg)  04/02/18 117 lb (53.1 kg)  02/24/18 123 lb (55.8 kg)    Lab Results  Component Value Date   WBC 3.9 (L) 04/02/2018   HGB 13.0 04/02/2018   HCT 38.6 04/02/2018   PLT 269.0 04/02/2018   GLUCOSE 79 04/02/2018   ALT 11 04/02/2018   AST 18 04/02/2018   NA 139 04/02/2018   K 4.1 04/02/2018   CL 102 04/02/2018   CREATININE 0.77 04/02/2018   BUN 16 04/02/2018   CO2 33 (H) 04/02/2018   TSH 0.60 04/02/2018   HGBA1C 5.5 04/02/2018    Assessment/Plan:  Acute vaginitis  -Discussed various causes -Abdomen swab collected.  We will send in medication pending results if needed. - Plan: POCT urinalysis dipstick, Cervicovaginal ancillary only  Pelvic pain in female -Per chart review patient seen by OB/GYN in the past.  S/p pelvic ultrasound 05/12/2017-concern for 1.9 cm cystic lesion within the right ovary likely hemorrhagic cyst.  6-8-week follow-up was recommended.  -Given history of ovarian cyst and concern for endometriosis follow-up with OB/GYN strongly encouraged -Also consider pelvic organ prolapse -Discussed using heat, NSAIDs as needed  Dyspareunia in female -Possibly 2/2 endometriosis,  fibroids, pelvic organ prolapse -Would recommend repeat imaging.  Will consider after results from this visit available.  Constipation, unspecified constipation type -Discussed increasing p.o. hydration, fiber intake -Consider MiraLAX as needed -Given handout -We will continue to monitor  F/u prn  Grier Mitts, MD

## 2019-07-29 NOTE — Patient Instructions (Signed)
Vim m ??o Vaginitis Vim m ??o l hi?n t??ng m ??o b? vim. B?nh ny th??ng do s? thay ??i cn b?ng bnh th??ng c?a vi khu?n v n?m men trong m ??o gy ra. Thay ??i cn b?ng ny gy ra s? pht tri?n qu m?c c?a m?t s? vi khu?n ho?c n?m men nh?t ??nh, gy vim. C nhi?u lo?i vim m ??o khc nhau, nh?ng nh?ng lo?i ph? bi?n nh?t l:  Nhi?m khu?n m ??o.  Nhi?m n?m men (nhi?m candida).  Vim m ??o do trichomonas. ?Schleyer l b?nh nhi?m trng ly truy?n qua ???ng tnh d?c (STI).  Vim m ??o do vi rt.  Vim m ??o th? teo.  Vim m ??o d? ?ng. Nguyn nhn g gy ra? Nguyn nhn ty thu?c vo lo?i vim m ??o. Vim m ??o c th? do:  Vi khu?n (nhi?m khu?n m ??o).  N?m men (nhi?m n?m men).  K sinh trng (vim m ??o do trichomonas)  Vi rt (vim m ??o do vi rt).  N?ng ?? hc mn th?p (vim m ??o th? teo). N?ng ?? hc mn th?p c th? x?[place] ra trong khi mang thai, cho con b ho?c sau khi mn kinh.  Ch?t kch thch, ch?ng h?n nh? t?m b?t, b?ng v? sinh c mi th?m v thu?c x?t cho n? (vim m ??o d? ?ng). Cc Finnell?u t? khc c th? thay ??i cn b?ng bnh th??ng c?a n?m men v vi khu?n trong m ??o. Nh?ng [place]?u t? ny bao g?m:  Thu?c khng sinh.  V? sinh khng t?t.  Mng ch?n, b?t bi?n m ??o, ch?t di?t tinh trng, thu?c trnh [place] v vng trnh [place] trong t? cung (IUD).  Quan h? tnh d?c.  Nhi?m trng.  B?nh ti?u ???ng khng ???c ki?m sot.  H? mi?n d?ch b? suy [place]el?u. Cc d?u hi?u ho?c tri?u ch?ng l g? Tri?u ch?ng c th? khc nhau, ty thu?c vo nguyn nhn gy vim m ??o. Cc tri?u ch?ng ph? bi?n bao g?m:  Kh h? ? m ??o b?t th??ng. ? Kh h? mu tr?ng, xm ho?c vng khi c nhi?m khu?n m ??o. ? Kh h? ??c, mu tr?ng v mi ph mt khi c nhi?m n?m men. ? Kh h? c b?t v mu vng ho?c xanh v?i b?nh do trichomonas.  m ??o c mi hi. ? Mi tanh v?i nhi?m khu?n m ??o.  Ng?a, ?au ho?c s?ng m ??o.  ?au khi giao h?p.  ?au ho?c rt khi ?i ti?u. ?i khi khng c tri?u  ch?ng g. Tnh tr?ng ny ???c ?i?u tr? nh? th? no? Ph??ng php ?i?u tr? ty thu?c vo lo?i nhi?m trng.  Nhi?m khu?n m ??o v b?nh do trichomonas th??ng ???c ?i?u tr? b?ng cc lo?i kem ho?c thu?c vin khng sinh.  Nhi?m n?m men th??ng ???c ?i?u tr? b?ng thu?c ch?ng n?m, ch?ng h?n nh? thu?c d?ng kem ho?c thu?c ??n m ??o.  Vim m ??o do vi rt khng ch?a kh?i ???c, nh?ng tri?u ch?ng c th? ???c ?i?u tr? b?ng thu?c lm gi?m c?m gic kh ch?u. B?n tnh c?a qu v? c?ng c?n ???c ?i?u tr?.  Vim m ??o teo c th? ???c ?i?u tr? b?ng kem, thu?c vin, thu?c ??n ho?c vng m ??o c estrogen. N?u kh m ??o, cc ch?t bi tr?n v kem d??ng ?m c th? c tc d?ng. Qu v? c th? ???c h??ng d?n trnh x phng th?m, bnh x?t ho?c th?t r?a m ??o.  ?i?u tr? vim m ??o d? ?ng  bao g?m b? s? d?ng s?n ph?m gy ra v?n ??. Cc lo?i kem bi m ??o c th? ???c s? d?ng ?? ?i?u tr? cc tri?u ch?ng. Tun th? nh?ng h??ng d?n ny ? nh:  S? d?ng t?t c? thu?c theo ch? d?n c?a chuyn gia ch?m [place] y t?.  Gi? vng sinh d?c s?ch v kh. Trnh x phng v ch? r?a vng ny b?ng n??c.  Trnh th?t r?a. Vi?c ? c th? lo?i b? cc vi khu?n c l?i trong m ??o.  Khng s? d?ng nt b?ng v? sinh ho?c quan h? tnh d?c cho ??n khi vim m ??o ???c ?i?u tr?. S? d?ng mi?ng dn v? sinh trong khi b? vim m ??o.  Lau t? tr??c ra sau. Lm nh? v?y ?? trnh ly lan vi khu?n t? tr?c trng sang m ??o.  ?? khng kh ??n vng sinh d?c c?a qu v?. ? M?c ?? lt b?ng v?i bng ?? lm gi?m s? tch t? ?? ?m. ? Trnh m?c ?? lt trong khi ng? cho ??n khi h?t vim m ??o. ? Trnh qu?n v ?? lt b ch?t ho?c ?? nylon m khng c t?m lt b?ng v?i bng. ? C?i b? qu?n o ??t (??c bi?t l qu?n o t?m) cng s?m cng t?t.  S? d?ng cc s?n ph?m nh?, khng c mi th?m. Hessie Diener s? d?ng cc ch?t kch thch, ch?ng h?n nh?: ? Bnh x?t th?m n?. ? N??c x? v?i. ? Ch?t t?y r?a c mi th?m. ? B?ng v? sinh c mi th?m. ? X phng th?m ho?c t?m b?t.  Quan h? tnh  d?c an ton v s? d?ng bao cao su. Mora Appl su c th? ng?n ng?a ly b?nh do trichomonas v vim m ??o do vi rt. Hy lin l?c v?i chuyn gia ch?m [place] s?c kh?e n?u:  Qu v? b? ?au b?ng.  Qu v? c cc tri?u ch?ng ko di h?n 2-3 ngy.  Qu v? b? s?t v cc tri?u ch?ng c?a qu v? ??t nhin tr?m tr?ng h?n. Thng tin ny khng nh?m m?c ?ch thay th? cho l?i khuyn m chuyn gia ch?m [place] s?c kh?e ni v?i qu v?. Hy b?o ??m qu v? ph?i th?o lu?n b?t k? v?n ?? g m qu v? c v?i chuyn gia ch?m [place] s?c kh?e c?a qu v?. Document Released: 06/17/2012 Document Revised: 03/04/2017 Document Reviewed: 03/04/2017 Elsevier Patient Education  [place].  To bn, Ng??i l?n Constipation, Adult To bn l khi m?t ng??i ?i ??i ti?n trong m?t tu?n t h?n so v?i bnh th??ng, ??i ti?n kh kh?n, ho?c ??i ti?n ra phn kh, c?ng, ho?c to h?n bnh th??ng. To bn c th? do m?t tnh tr?ng bn trong gy ra. N c th? tr? ln tr?m tr?ng h?n theo ?? tu?i n?u m?t ng??i dng cc lo?i thu?c nh?t ??nh v khng u?ng ?? n??c. Tun th? nh?ng h??ng d?n ny ? nh: ?n v u?ng   ?n th?c ?n c nhi?u ch?t x? nh? tri cy t??i v rau, ng? c?c nguyn h?t v cc lo?i ??u.  H?n ch? th?c ?n c nhi?u ch?t bo, t ch?t x?, ho?c ch? bi?n s?n qu m?c, ch?ng h?n khoai ty chin, bnh hamburger, bnh quy, k?o v soda.  U?ng ?? n??c ?? gi? cho n??c ti?u trong ho?c c mu vng nh?t. H??ng d?n chung  T?p th? d?c th??ng xuyn theo ch? d?n c?a chuyn gia ch?m [place] s?c kh?e.  ?i v? sinh ngay khi qu v? c nhu  c?u. Khng nh?n ?i ??i ti?n.  Ch? s? d?ng thu?c khng k ??n v thu?c k ??n theo ch? d?n c?a chuyn gia ch?m [place] s?c kh?e. Cc thu?c ny bao g?m b?t k? th?c ph?m ch?c n?ng c ch?t x? no.  Th?c hnh cc bi t?p luy?n l?i c? ?y ch?u, ch?ng h?n nh? th? su trong lc th? gin b?ng d??i v th? gin ph?n ?y ch?u trong khi ??i ti?n.  Theo di tnh tr?ng c?a qu v? ?? pht hi?n b?t k? thay ??i no.  Tun th? t?t c? cc l?n khm  theo di theo ch? d?n c?a chuyn gia ch?m [place] s?c kh?e. ?i?u ny c vai tr quan tr?ng. Hy lin l?c v?i chuyn gia ch?m [place] s?c kh?e n?u:  Qu v? b? ?au tr?m tr?ng h?n.  Qu v? b? s?t.  Qu v? khng ?i ??i ti?n sau 4 ngy.  Qu v? nn.  Qu v? khng ?i.  Qu v? b? s?t cn.  Qu v? b? ch?y mu ? h?u mn.  Phn c?a qu v? m?ng, trng nh? bt ch. Yu c?u tr? gip ngay l?p t?c n?u:  Qu v? b? s?t v cc tri?u ch?ng c?a qu v? ??t nhin tr?m tr?ng h?n.  Qu v? sn phn ho?c c mu trong phn.  B?ng c?a qu v? b? ch??ng ln.  Qu v? b? ?au r?t nhi?u ? b?ng.  Qu v? c?m th?y chng m?t ho?c ng?t x?u. Thng tin ny khng nh?m m?c ?ch thay th? cho l?i khuyn m chuyn gia ch?m [place] s?c kh?e ni v?i qu v?. Hy b?o ??m qu v? ph?i th?o lu?n b?t k? v?n ?? g m qu v? c v?i chuyn gia ch?m [place] s?c kh?e c?a qu v?. Document Released: 01/08/2011 Document Revised: 01/06/2017 Document Reviewed: 03/13/2016 Elsevier Patient Education  2020 Elsevier Inc.  ?au b?ng, Ng??i l?n Abdominal Pain, Adult Nhi?u nguyn nhn c th? gy ra ?au b?ng. ?au b?ng th??ng khng nghim tr?ng v ?? m khng c?n ph?i ?i?u tr? ho?c khi ???c ?i?u tr? t?i nh. Tuy nhin, ?i khi ?au b?ng l nghim tr?ng. Chuyn gia ch?m [place] s?c kh?e c?a qu v? s? khai thc b?nh s? v khm th?c th? ?? xc ??nh nguyn nhn gy ra ?au b?ng. Tun th? nh?ng h??ng d?n ny ? nh:  Ch? s? d?ng thu?c khng k ??n v thu?c k ??n theo ch? d?n c?a chuyn gia ch?m [place] s?c kh?e. Khng dng thu?c nhu?n trng tr? khi ???c chuyn gia ch?m [place] s?c kh?e c?a qu v? ch? d?n.  U?ng ?? n??c ?? gi? cho n??c ti?u trong ho?c c mu vng nh?t.  Theo di tnh tr?ng c?a qu v? ?? pht hi?n b?t k? thay ??i no.  Tun th? t?t c? cc cu?c h?n khm l?i theo ch? d?n c?a chuyn gia ch?m [place] s?c kh?e. ?i?u ny c vai tr quan tr?ng. Hy lin l?c v?i chuyn gia ch?m [place] s?c kh?e n?u:  Tnh tr?ng ?au b?ng c?a qu v? thay ??i ho?c tr?m tr?ng h?n.  Qu v? khng  ?i ho?c qu v? b? s?t cn m khng c? g?ng gi?m cn.  Qu v? b? to bn ho?c b? tiu ch?y qu 2-3 ngy.  Qu v? b? ?au khi ?i ti?u ho?c ?i ngoi.  ?au b?ng khi?n qu v? t?nh gi?c vo ban ?m.  ?au tr? nn tr?m tr?ng h?n khi ?n, sau khi ?n, ho?c khi ?n m?t s? th?c ph?m nh?t ??nh.  Qu v? nn v  khng th? km l?i ???c.  Qu v? b? s?t. Yu c?u tr? gip ngay l?p t?c n?u:  C?n ?au c?a qu v? khng h?t s?m theo nh? d? ki?n c?a chuyn gia ch?m [place] s?c kh?e c?a qu v?.  Qu v? v?n ti?p t?c b? nn.  Ch? c?m th?y ?au ? cc vng c?a b?ng, ch?ng h?n nh? ? ph?n b?ng d??i bn ph?i ho?c bn tri.  Quy? vi? ?i ngoi phn c ma?u ho??c phn ma?u ?en, ho??c phn trng nh? h??c i?n.  Qu v? b? ?au r?t nhi?u, co th?t ho??c ch???ng b?ng.  Qu v? c cc d?u hi?u m?t n??c, ch?ng h?n nh?: ? N??c ti?u s?m mu, r?t t n??c ti?u, ho?c khng c n??c ti?u. ? Mi n?t n?. ? Mi?ng kh. ? M?t tr?ng. ? Bu?n ng?. ? Y?u. Thng tin ny khng nh?m m?c ?ch thay th? cho l?i khuyn m chuyn gia ch?m [place] s?c kh?e ni v?i qu v?. Hy b?o ??m qu v? ph?i th?o lu?n b?t k? v?n ?? g m qu v? c v?i chuyn gia ch?m [place] s?c kh?e c?a qu v?. Document Released: 09/23/2005 Document Revised: 09/12/2016 Document Reviewed: 03/06/2016 Elsevier Interactive Patient Education  2020 [employer].

## 2019-07-30 ENCOUNTER — Encounter: Payer: Self-pay | Admitting: Family Medicine

## 2019-08-05 LAB — CERVICOVAGINAL ANCILLARY ONLY
Bacterial Vaginitis (gardnerella): NEGATIVE
Candida Glabrata: NEGATIVE
Candida Vaginitis: NEGATIVE
Chlamydia: NEGATIVE
Comment: NEGATIVE
Comment: NEGATIVE
Comment: NEGATIVE
Comment: NEGATIVE
Comment: NEGATIVE
Comment: NORMAL
Neisseria Gonorrhea: NEGATIVE
Trichomonas: NEGATIVE

## 2019-09-06 ENCOUNTER — Telehealth: Payer: Self-pay | Admitting: Obstetrics and Gynecology

## 2019-09-06 NOTE — Telephone Encounter (Signed)
Patient's daughter Calla Kicks (ok per dpr) called to schedule appointment for patient for pelvic pain. Last seen 02/04/18.

## 2019-09-06 NOTE — Telephone Encounter (Signed)
Spoke with pts daughter ok per DPR. Pt started having pelvic/lower abd  pain x 1 week ago. Pt had no injury. Has Hx of ovarian cyst. Denies bleeding. Pt daughter's states pt is working today and will be available for appt in am. Pt scheduled for OV 09/08/19 at 0830. Pts daughter will come with pt to interpret. Ok per Kandace Blitz, RN   Will route to Dr Quincy Simmonds for review and will close encounter.

## 2019-09-07 NOTE — Progress Notes (Signed)
GYNECOLOGY  VISIT   HPI: 53 y.o.   Married  [place]  female   [phone] with Patient's last menstrual period was 08/09/2019.   here for LLQ pain/mid pelvic pain daily and sometimes in low back for 2 weeks. Patient denies nausea, fever.  She does complain of dysuria.  Her daughter is present to help with interpretation.   Last BM was this am, and this causes pain also.  She noticed some blood when she has a BM.  She needed to strain.    When she voids, she does feel pain.  She states she is having difficulty voiding.   She is having painful sex.   Her period 08-09-19 only spotting and she states LNMP was 02/2019.  She has spinal stenosis.  She saw a provider about her back pain.  Urine Dip:Neg UPT: Neg  GYNECOLOGIC HISTORY: Patient's last menstrual period was 08/09/2019. Contraception: none Menopausal hormone therapy:  none Last mammogram: 08/05/17 BIRADS 1 negative/density c Last pap smear: 03-13-17 Neg, 10-04-15 Neg        OB History    Gravida  2   Para  2   Term  2   Preterm      AB      Living  2     SAB      TAB      Ectopic      Multiple      Live Births                 Patient Active Problem List   Diagnosis Date Noted  . Spinal stenosis 12/19/2017  . Pelvic pain in female 10/04/2015  . Dyspareunia in female 10/04/2015  . Encounter for routine gynecological examination 10/04/2015  . Abdominal pain, chronic, right lower quadrant 07/27/2015    Past Medical History:  Diagnosis Date  . Abdominal pain in female    For 3-4 years  . Heart murmur   . Urinary incontinence     Past Surgical History:  Procedure Laterality Date  . HEMORROIDECTOMY      Current Outpatient Medications  Medication Sig Dispense Refill  . AMBULATORY NON FORMULARY MEDICATION Nitroglycerin ointment 0.125% Use pea sized amount per rectum for two months three times daily 30 g 0  . norethindrone (MICRONOR,CAMILA,ERRIN) 0.35 MG tablet Take 1 tablet (0.35 mg total) by  mouth daily. 1 Package 2  . omeprazole (PRILOSEC) 20 MG capsule Take 20 mg by mouth daily.    . pantoprazole (PROTONIX) 40 MG tablet Take 1 tablet (40 mg total) by mouth daily. 30 tablet 3   Current Facility-Administered Medications  Medication Dose Route Frequency Provider Last Rate Last Dose  . 0.9 %  sodium chloride infusion  500 mL Intravenous Once Nandigam, Eleonore Chiquito, MD         ALLERGIES: Patient has no known allergies.  Family History  Problem Relation Age of Onset  . Other Mother        Lung Disease??  . Cirrhosis Father   . Colon cancer Neg Hx   . Colon polyps Neg Hx   . Esophageal cancer Neg Hx     Social History   Socioeconomic History  . Marital status: Married    Spouse name: Not on file  . Number of children: Not on file  . Years of education: Not on file  . Highest education level: Not on file  Occupational History  . Not on file  Social Needs  . Financial resource strain: Not on  file  . Food insecurity    Worry: Not on file    Inability: Not on file  . Transportation needs    Medical: Not on file    Non-medical: Not on file  Tobacco Use  . Smoking status: Never Smoker  . Smokeless tobacco: Never Used  Substance and Sexual Activity  . Alcohol use: No  . Drug use: No  . Sexual activity: Yes    Partners: Male    Birth control/protection: None  Lifestyle  . Physical activity    Days per week: Not on file    Minutes per session: Not on file  . Stress: Not on file  Relationships  . Social Herbalist on phone: Not on file    Gets together: Not on file    Attends religious service: Not on file    Active member of club or organization: Not on file    Attends meetings of clubs or organizations: Not on file    Relationship status: Not on file  . Intimate partner violence    Fear of current or ex partner: Not on file    Emotionally abused: Not on file    Physically abused: Not on file    Forced sexual activity: Not on file  Other Topics  Concern  . Not on file  Social History Narrative  . Not on file    Review of Systems  Genitourinary: Positive for dysuria and pelvic pain.  All other systems reviewed and are negative.   PHYSICAL EXAMINATION:    BP 100/66   Pulse 70   Temp (!) 96.8 F (36 C) (Temporal)   Ht 5' (1.524 m)   Wt 123 lb (55.8 kg)   LMP 08/09/2019   BMI 24.02 kg/m     General appearance: alert, cooperative and appears stated age Head: Normocephalic, without obvious abnormality, atraumatic Lungs: clear to auscultation bilaterally Heart: regular rate and rhythm Abdomen: soft, non-tender, no masses,  no organomegaly Extremities: extremities normal, atraumatic, no cyanosis or edema Skin: Skin color, texture, turgor normal. No rashes or lesions No abnormal inguinal nodes palpated Neurologic: Grossly normal  Pelvic: External genitalia:  no lesions              Urethra:  normal appearing urethra with no masses, tenderness or lesions              Bartholins and Skenes: normal                 Vagina: normal appearing vagina with normal color and discharge, no lesions              Cervix: no lesions                Bimanual Exam:  Uterus:  normal size, contour, position, consistency, mobility, non-tender              Adnexa: no mass, fullness, tenderness              Rectal exam: Yes.  .  Confirms.              Anus:  normal sphincter tone, no lesions  Chaperone was present for exam.  ASSESSMENT  Pelvic pain.  LLQ pain. Back pain.  Constipation and rectal bleeding with straining.  Dyspareunia.  May be related to atrophy. Spinal stenosis.   PLAN  We discussed reasons for her pain - ovarian cysts, constipation, spinal stenosis.  She will start Colace daily.  We discussed  dietary changes - increased fiber and water. I recommended increased physical activity.  Motrin 600 mg every 6 hours.  NuSwab to [facility].   Try Replens for vaginal atrophy and dryness. Return for pelvic US.  She will see her  PCP if her rectal bleeding and back pain persist.   An After Visit Summary was printed and given to the patient.  ___25___ minutes face to face time of which over 50% was spent in counseling.

## 2019-09-08 ENCOUNTER — Ambulatory Visit: Payer: BC Managed Care – PPO | Admitting: Obstetrics and Gynecology

## 2019-09-08 ENCOUNTER — Other Ambulatory Visit (HOSPITAL_COMMUNITY)
Admission: RE | Admit: 2019-09-08 | Discharge: 2019-09-08 | Disposition: A | Discharge status: Home / Self Care | Payer: BC Managed Care – PPO | Source: Ambulatory Visit | Attending: Obstetrics and Gynecology | Admitting: Obstetrics and Gynecology

## 2019-09-08 ENCOUNTER — Telehealth: Payer: Self-pay | Admitting: Obstetrics and Gynecology

## 2019-09-08 ENCOUNTER — Other Ambulatory Visit: Payer: Self-pay

## 2019-09-08 ENCOUNTER — Encounter: Payer: Self-pay | Admitting: Obstetrics and Gynecology

## 2019-09-08 VITALS — BP 100/66 | HR 70 | Temp 96.8°F | Ht 60.0 in | Wt 123.0 lb

## 2019-09-08 DIAGNOSIS — N941 Unspecified dyspareunia: Secondary | ICD-10-CM | POA: Diagnosis not present

## 2019-09-08 DIAGNOSIS — R102 Pelvic and perineal pain: Secondary | ICD-10-CM | POA: Diagnosis not present

## 2019-09-08 DIAGNOSIS — R300 Dysuria: Secondary | ICD-10-CM | POA: Diagnosis not present

## 2019-09-08 DIAGNOSIS — K5900 Constipation, unspecified: Secondary | ICD-10-CM

## 2019-09-08 DIAGNOSIS — N926 Irregular menstruation, unspecified: Secondary | ICD-10-CM

## 2019-09-08 DIAGNOSIS — R1032 Left lower quadrant pain: Secondary | ICD-10-CM

## 2019-09-08 LAB — POCT URINALYSIS DIPSTICK
Bilirubin, UA: NEGATIVE
Blood, UA: NEGATIVE
Glucose, UA: NEGATIVE
Ketones, UA: NEGATIVE
Leukocytes, UA: NEGATIVE
Nitrite, UA: NEGATIVE
Protein, UA: NEGATIVE
Urobilinogen, UA: 0.2 E.U./dL
pH, UA: 5 (ref 5.0–8.0)

## 2019-09-08 LAB — POCT URINE PREGNANCY: Preg Test, Ur: NEGATIVE

## 2019-09-08 NOTE — Patient Instructions (Addendum)
Replens is a good product to use in the vagina for lubrication with and without sex.   Docusate capsules ?y l thu?c g? DOCUSATE l thu?c lm m?m phn. N gip phng ng?a to bn v c?ng t?c ho?c kh ch?u c lin quan ??n tnh tr?ng phn b? c?ng ho?c kh. Thu?c ny c th? ???c dng cho nh?ng m?c ?ch khc; hy h?i ng??i cung c?p d?ch v? y t? ho?c d??c s? c?a mnh, n?u qu v? c th?c m?c. (CC) NHN HI?U PH? BI?N: Colace, Colace Clear, Correctol, D.O.S., DC, Doc-Q-Lace, DocuLace, Docusoft S, DOK, DOK Extra Strength, Dulcolax, Genasoft, Kao-Tin, Kaopectate Liqui-Gels, Phillips Stool Softener, Stool Softener, Stool Softner DC, Sulfolax, Sur-Q-Lax, Surfak, Uni-Ease Ti c?n ph?i bo cho ng??i cung c?p d?ch v? y t? c?a mnh ?i?u g tr??c khi dng thu?c ny? H? c?n bi?t li?u qu v? hi?n c b?t k? tnh tr?ng no sau ?y hay khng:  bu?n i ho?c i m?a  to bn n?ng  ?au b?ng  thay ??i ??t ng?t thi quen ??i ti?n ko di h?n 2 tu?n  pha?n ??ng b?t th???ng ho??c di? ??ng v??i docusate  pha?n ??ng b?t th???ng ho??c di? ??ng v??i ca?c d??c ph?m kha?c  pha?n ??ng b?t th???ng ho??c di? ??ng v??i th??c ph?m, thu?c nhu?m, ho??c ch?t ba?o qua?n  ?ang c thai ho??c ??nh co? thai  ?ang cho con bu? Ti nn s? d?ng thu?c ny nh? th? no? U?ng thu?c ny v?i m?t ly n??c. Hy lm theo cc h??ng d?n trn h?p thu?c ho?c nhn thu?c. Dng thu?c ny vo nh?ng kho?ng th?i gian ??u nhau. Khng ???c dng thu?c ny nhi?u l?n h?n ? ???c ch? d?n. Hy bn v?i bc s? nhi khoa c?a qu v? v? vi?c dng thu?c ny ? tr? em. Thu?c ny c th? ???c k toa cho tr? em ch? m?i 2 tu?i trong nh?ng tr??ng h?p ch?n l?c, nh?ng c?n ph?i th?n tr?ng. Qu li?u: N?u qu v? cho r?ng mnh ? dng qu nhi?u thu?c ny, th hy lin l?c v?i trung tm ki?m sot ch?t ??c ho?c phng c?p c?u ngay l?p t?c. L?U : Thu?c ny ch? dnh ring cho qu v?. Khng chia s? thu?c ny v?i nh?ng ng??i khc. N?u ti l? qun m?t li?u th sao? N?u qu v?  l? qun m?t li?u thu?c, hy dng li?u thu?c ? ngay khi c th?. N?u h?u nh? ? ??n gi? dng li?u thu?c k? ti?p, th ch? dng li?u thu?c k? ti?p ?Marland Kitchen Khng ???c dng li?u g?p ?i ho?c dng thm li?u. Nh?ng g c th? t??ng tc v?i thu?c ny?  d?u khong Danh sch ny c th? khng m t? ?? h?t cc t??ng tc c th? x?y ra. Hy ??a cho ng??i cung c?p d?ch v? y t? c?a mnh danh sch t?t c? cc thu?c, th?o d??c, cc thu?c khng c?n toa, ho?c cc ch? ph?m b? sung m qu v? dng. C?ng nn bo cho h? bi?t r?ng qu v? c ht thu?c, u?ng r??u, ho?c c s? d?ng ma ty tri php hay khng. Vi th? c th? t??ng tc v?i thu?c c?a qu v?. Ti c?n ph?i theo di ?i?u g trong khi dng thu?c ny? Khng ???c dng thu?c ny qu 1 tu?n m khng h?i  ki?n bc s? ho?c [place] vin y t?. N?u tnh tr?ng to bn ti pht, th hy lin l?c v?i bc s? ho?c chuyn vin y t?. Hy u?ng nhi?u n??c trong khi dng thu?c ny. U?ng n??c gip lm  gi?m to bn. Hy ng?ng dng thu?c ny v lin l?c v?i bc s? ho?c chuyn vin y t?, n?u qu v? b? xu?t huy?t ru?t th?ng (tr?c trng) ho?c khng ?i ??i ti?n sau khi dng thu?c. ?y c th? l cc d?u hi?u c?a m?t tnh tr?ng nghim tr?ng h?n. Ti c th? nh?n th?y nh?ng tc d?ng ph? no khi dng thu?c ny? Nh?ng tc d?ng ph? qu v? c?n ph?i bo cho bc s? ho?c chuyn vin y t? cng s?m cng t?t:  cc ph?n ?ng d? ?ng, ch?ng h?n nh? da b? m?n ??, ng?a, n?i my ?ay, s?ng ? m?t, mi, ho?c l??i Cc tc d?ng ph? khng c?n ph?i ch?m [place] y t? (hy bo cho bc s? ho?c chuyn vin y t?, n?u cc tc d?ng ph? ny ti?p di?n ho?c gy phi?n toi):  tiu ch?y  ?au b?ng qu?n  kch ?ng h?ng Danh sch ny c th? khng m t? ?? h?t cc tc d?ng ph? c th? x?y ra. Xin g?i t?i bc s? c?a mnh ?? ???c c? v?n chuyn mn v? cc tc d?ng ph?Ladell Heads v? c th? t??ng trnh cc tc d?ng ph? cho FDA theo s? [phone]. Ti nn c?t gi? thu?c c?a mnh ? ?u? ?? ngoi t?m tay tr? em. C?t gi? ? nhi?t ?? phng t? 17 ??n 32 ?? C  (61 ??n 88 ?? F). V?t b? t?t c? thu?c ch?a dng sau ngy h?t h?n in trn nhn thu?c ho?c bao thu?c. L?U : ?y l b?n tm t?t. N c th? khng bao hm t?t c? thng tin c th? c. N?u qu v? th?c m?c v? thu?c ny, xin trao ??i v?i bc s?, d??c s?, ho?c ng??i cung c?p d?ch v? y t? c?a mnh.  2020 Elsevier/Gold Standard (2009-08-25 00:00:00) To bn, Ng??i l?n Constipation, Adult To bn l khi m?t ng??i ?i ??i ti?n trong m?t tu?n t h?n so v?i bnh th??ng, ??i ti?n kh kh?n, ho?c ??i ti?n ra phn kh, c?ng, ho?c to h?n bnh th??ng. To bn c th? do m?t tnh tr?ng bn trong gy ra. N c th? tr? ln tr?m tr?ng h?n theo ?? tu?i n?u m?t ng??i dng cc lo?i thu?c nh?t ??nh v khng u?ng ?? n??c. Tun th? nh?ng h??ng d?n ny ? nh: ?n v u?ng   ?n th?c ?n c nhi?u ch?t x? nh? tri cy t??i v rau, ng? c?c nguyn h?t v cc lo?i ??u.  H?n ch? th?c ?n c nhi?u ch?t bo, t ch?t x?, ho?c ch? bi?n s?n qu m?c, ch?ng h?n khoai ty chin, bnh hamburger, bnh quy, k?o v soda.  U?ng ?? n??c ?? gi? cho n??c ti?u trong ho?c c mu vng nh?t. H??ng d?n chung  T?p th? d?c th??ng xuyn theo ch? d?n c?a chuyn gia ch?m [place] s?c kh?e.  ?i v? sinh ngay khi qu v? c nhu c?u. Khng nh?n ?i ??i ti?n.  Ch? s? d?ng thu?c khng k ??n v thu?c k ??n theo ch? d?n c?a chuyn gia ch?m [place] s?c kh?e. Cc thu?c ny bao g?m b?t k? th?c ph?m ch?c n?ng c ch?t x? no.  Th?c hnh cc bi t?p luy?n l?i c? ?y ch?u, ch?ng h?n nh? th? su trong lc th? gin b?ng d??i v th? gin ph?n ?y ch?u trong khi ??i ti?n.  Theo di tnh tr?ng c?a qu v? ?? pht hi?n b?t k? thay ??i no.  Tun th? t?t c? cc l?n khm theo di theo ch? d?n c?a chuyn gia  ch?m [place] s?c kh?e. ?i?u ny c vai tr quan tr?ng. Hy lin l?c v?i chuyn gia ch?m [place] s?c kh?e n?u:  Qu v? b? ?au tr?m tr?ng h?n.  Qu v? b? s?t.  Qu v? khng ?i ??i ti?n sau 4 ngy.  Qu v? nn.  Qu v? khng ?i.  Qu v? b? s?t cn.  Qu v? b? ch?y mu ? h?u mn.   Phn c?a qu v? m?ng, trng nh? bt ch. Yu c?u tr? gip ngay l?p t?c n?u:  Qu v? b? s?t v cc tri?u ch?ng c?a qu v? ??t nhin tr?m tr?ng h?n.  Qu v? sn phn ho?c c mu trong phn.  B?ng c?a qu v? b? ch??ng ln.  Qu v? b? ?au r?t nhi?u ? b?ng.  Qu v? c?m th?y chng m?t ho?c ng?t x?u. Thng tin ny khng nh?m m?c ?ch thay th? cho l?i khuyn m chuyn gia ch?m [place] s?c kh?e ni v?i qu v?. Hy b?o ??m qu v? ph?i th?o lu?n b?t k? v?n ?? g m qu v? c v?i chuyn gia ch?m [place] s?c kh?e c?a qu v?. Document Released: 01/08/2011 Document Revised: 01/06/2017 Document Reviewed: 03/13/2016 Elsevier Patient Education  2020 [employer]

## 2019-09-08 NOTE — Telephone Encounter (Signed)
Patients daughter, Lenna Sciara (she is listred on [place]) returned call for her mother. Reviewed benefit for recommended ultrasound. Patient is scheduled 09/16/2019 with Dr Quincy Simmonds, earlier appointment dates offered, but were declined. Patient/daughter is aware of the appointment date, arrival time and cancellation policy. No further questions. Will close encounter   cc: Lamont Snowball, RN

## 2019-09-08 NOTE — Telephone Encounter (Signed)
Call placed to patient to review benefit and scheduled recommended ultrasound. Left a voicemail message requesting a return call   cc: Lamont Snowball, RN

## 2019-09-09 LAB — CERVICOVAGINAL ANCILLARY ONLY
Bacterial Vaginitis (gardnerella): NEGATIVE
Candida Glabrata: NEGATIVE
Candida Vaginitis: NEGATIVE
Chlamydia: NEGATIVE
Comment: NEGATIVE
Comment: NEGATIVE
Comment: NEGATIVE
Comment: NEGATIVE
Comment: NEGATIVE
Comment: NORMAL
Neisseria Gonorrhea: NEGATIVE
Trichomonas: NEGATIVE

## 2019-09-14 ENCOUNTER — Telehealth: Payer: Self-pay | Admitting: *Deleted

## 2019-09-14 NOTE — Telephone Encounter (Signed)
Notes recorded by Burnice Logan, RN on 09/14/2019 at 11:31 AM EST  Call placed to patient using [facility], "V", ID [phone]. Left message on mobile number to call Sharee Pimple, RN at [facility]. Call placed to home number, no answer, voicemail not set up, unable to leave message.

## 2019-09-14 NOTE — Telephone Encounter (Signed)
-----   Message from Nunzio Cobbs, MD sent at 09/14/2019 10:49 AM EST ----- Please contact patient to let her know that her testing is negative for gonorrhea, chlamydia, trichomonas, yeast, and bacterial vaginosis.  She was seen for pelvic pain and painful intercourse.   She is returning on 09/16/19 for a pelvic ultrasound.

## 2019-09-16 ENCOUNTER — Ambulatory Visit: Payer: BC Managed Care – PPO | Admitting: Obstetrics and Gynecology

## 2019-09-16 ENCOUNTER — Encounter: Payer: Self-pay | Admitting: Obstetrics and Gynecology

## 2019-09-16 ENCOUNTER — Other Ambulatory Visit: Payer: Self-pay

## 2019-09-16 ENCOUNTER — Telehealth: Payer: Self-pay | Admitting: Obstetrics and Gynecology

## 2019-09-16 ENCOUNTER — Other Ambulatory Visit: Payer: Self-pay | Admitting: Obstetrics and Gynecology

## 2019-09-16 ENCOUNTER — Ambulatory Visit (INDEPENDENT_AMBULATORY_CARE_PROVIDER_SITE_OTHER): Payer: BC Managed Care – PPO

## 2019-09-16 VITALS — BP 110/74 | HR 60 | Temp 97.2°F | Ht 60.0 in | Wt 122.4 lb

## 2019-09-16 DIAGNOSIS — R1032 Left lower quadrant pain: Secondary | ICD-10-CM | POA: Diagnosis not present

## 2019-09-16 DIAGNOSIS — N9489 Other specified conditions associated with female genital organs and menstrual cycle: Secondary | ICD-10-CM

## 2019-09-16 DIAGNOSIS — N6320 Unspecified lump in the left breast, unspecified quadrant: Secondary | ICD-10-CM

## 2019-09-16 DIAGNOSIS — N644 Mastodynia: Secondary | ICD-10-CM | POA: Diagnosis not present

## 2019-09-16 DIAGNOSIS — R102 Pelvic and perineal pain: Secondary | ICD-10-CM | POA: Diagnosis not present

## 2019-09-16 MED ORDER — IBUPROFEN 800 MG PO TABS: 800.0000 mg | ORAL_TABLET | Freq: Three times a day (TID) | ORAL | 0 refills | Status: DC | PRN

## 2019-09-16 NOTE — Progress Notes (Signed)
GYNECOLOGY  VISIT   HPI: 53 y.o.   Married  [place]  female   [phone] with Patient's last menstrual period was 08/09/2019.   here for pelvic ultrasound for LLQ pain, pelvic pain, and back pain, occurring for a long time and worsening for 2 months.  [place] interpretor present for the entire visit.   She states her above pain is the same today.   She reported constipation and rectal bleeding with straining.  She took Colace, and this is resolved now.   Addendum notification:   Triage nurse has found that the patient has an active order for a dx left mammogram and [place] for a left breast lump.   Patient brought back into exam room with interpreter.  Patient reports left breast lump at 10:00 for 1 - 2 years.  Has pain in right breast at 9:00. No change in size.  Pain once in a while. No prior evaluation of biopsy.   GYNECOLOGIC HISTORY: Patient's last menstrual period was 08/09/2019. Contraception: ??Micronor--pat.only took 1 months. Menopausal hormone therapy:  n/a Last mammogram: 08/05/17 BIRADS 1 negative/density c Last pap smear: : 03-13-17 Neg, 10-04-15 Neg        OB History    Gravida  2   Para  2   Term  2   Preterm      AB      Living  2     SAB      TAB      Ectopic      Multiple      Live Births                 Patient Active Problem List   Diagnosis Date Noted  . Spinal stenosis 12/19/2017  . Pelvic pain in female 10/04/2015  . Dyspareunia in female 10/04/2015  . Encounter for routine gynecological examination 10/04/2015  . Abdominal pain, chronic, right lower quadrant 07/27/2015    Past Medical History:  Diagnosis Date  . Abdominal pain in female    For 3-4 years  . Heart murmur   . Urinary incontinence     Past Surgical History:  Procedure Laterality Date  . HEMORROIDECTOMY      Current Outpatient Medications  Medication Sig Dispense Refill  . AMBULATORY NON FORMULARY MEDICATION Nitroglycerin ointment 0.125% Use pea sized  amount per rectum for two months three times daily 30 g 0  . docusate sodium (COLACE) 100 MG capsule Take 100 mg by mouth.    . norethindrone (MICRONOR,CAMILA,ERRIN) 0.35 MG tablet Take 1 tablet (0.35 mg total) by mouth daily. 1 Package 2  . omeprazole (PRILOSEC) 20 MG capsule Take 20 mg by mouth daily.    . pantoprazole (PROTONIX) 40 MG tablet Take 1 tablet (40 mg total) by mouth daily. 30 tablet 3   Current Facility-Administered Medications  Medication Dose Route Frequency Provider Last Rate Last Admin  . 0.9 %  sodium chloride infusion  500 mL Intravenous Once Nandigam, Eleonore Chiquito, MD         ALLERGIES: Patient has no known allergies.  Family History  Problem Relation Age of Onset  . Other Mother        Lung Disease??  . Cirrhosis Father   . Colon cancer Neg Hx   . Colon polyps Neg Hx   . Esophageal cancer Neg Hx     Social History   Socioeconomic History  . Marital status: Married    Spouse name: Not on file  . Number of children:  Not on file  . Years of education: Not on file  . Highest education level: Not on file  Occupational History  . Not on file  Tobacco Use  . Smoking status: Never Smoker  . Smokeless tobacco: Never Used  Substance and Sexual Activity  . Alcohol use: No  . Drug use: No  . Sexual activity: Yes    Partners: Male    Birth control/protection: None  Other Topics Concern  . Not on file  Social History Narrative  . Not on file   Social Determinants of Health   Financial Resource Strain:   . Difficulty of Paying Living Expenses: Not on file  Food Insecurity:   . Worried About Charity fundraiser in the Last Year: Not on file  . Ran Out of Food in the Last Year: Not on file  Transportation Needs:   . Lack of Transportation (Medical): Not on file  . Lack of Transportation (Non-Medical): Not on file  Physical Activity:   . Days of Exercise per Week: Not on file  . Minutes of Exercise per Session: Not on file  Stress:   . Feeling of Stress  : Not on file  Social Connections:   . Frequency of Communication with Friends and Family: Not on file  . Frequency of Social Gatherings with Friends and Family: Not on file  . Attends Religious Services: Not on file  . Active Member of Clubs or Organizations: Not on file  . Attends Archivist Meetings: Not on file  . Marital Status: Not on file  Intimate Partner Violence:   . Fear of Current or Ex-Partner: Not on file  . Emotionally Abused: Not on file  . Physically Abused: Not on file  . Sexually Abused: Not on file    Review of Systems  All other systems reviewed and are negative.   PHYSICAL EXAMINATION:    BP 110/74   Pulse 60   Temp (!) 97.2 F (36.2 C) (Temporal)   Ht 5' (1.524 m)   Wt 122 lb 6.4 oz (55.5 kg)   LMP 08/09/2019   BMI 23.90 kg/m     General appearance: alert, cooperative and appears stated age   Breasts: right - normal appearance, no masses.  Has tenderness at 9:00. No nipple retraction or dimpling, No nipple discharge or bleeding, No axillary or supraclavicular adenopathy  Left - 10 x 7 mm glandular tissue at 10:00, no tenderness.  No nipple retraction or dimpling, No nipple discharge or bleeding, No axillary or supraclavicular adenopathy  Pelvic US Uterus no masses. EMB 2.83 mm.  Ovaries normal.  No free fluid. Left pelvic congestion.   Chaperone was present for exam.  ASSESSMENT  Pelvic pain, LLQ pain, and back pain.  Pelvic congestion.  Perimenopausal female. Left breast mass versus glandular tissue. Right breast tenderness.  PLAN  Pelvic congestion discussed. Motrin 800 mg po q 8 hours prn. Consider MPA 30 mg daily or Nexplanon after updated annual exam and has dx bilateral mammogram and bilateral US. Patient understands that her breast evaluation is diagnostic and not screening and insurance implications of this.  Information provided in [place] about the treatment options for pelvic congestion.   An After Visit  Summary was printed and given to the patient.  __25____ minutes face to face time of which over 50% was spent in counseling.

## 2019-09-16 NOTE — Telephone Encounter (Addendum)
Spoke with patient, interpretor present.   1. Advised of results as seen below.  Maria Cobbs, MD  [facility] Triage Pool  Please contact patient to let her know that her testing is negative for gonorrhea, chlamydia, trichomonas, yeast, and bacterial vaginosis.  She was seen for pelvic pain and painful intercourse.   She is returning on 09/16/19 for a pelvic ultrasound.    2. Patient has an existing order for left breast Dx MMG and [place] placed by PCP in 05/2019. Patient will be evaluated by Dr. Quincy Simmonds prior to scheduling breast imaging.   Routing to Dr. Quincy Simmonds

## 2019-09-16 NOTE — Telephone Encounter (Signed)
Burnice Logan, RN  09/16/2019 9:25 AM EST    Patient notified of results with interpretor present while in office, see telephone encounter dated 09/16/19.    Encounter closed.

## 2019-09-16 NOTE — Telephone Encounter (Signed)
Patient scheduled while in office, interpretor present. Scheduled for bilateral Dx MMG and bilateral breast ultrasounds, if needed, on 12/31 at 3pm, arrive at 2:15pm. Patient verbalizes understanding and is agreeable.   Patient placed in MMG hold.   Routing to provider for final review. Patient is agreeable to disposition. Will close encounter.

## 2019-09-16 NOTE — Telephone Encounter (Signed)
Please assist with scheduling a screening mammogram at [facility].   She speaks [place] and her interpretor is here today.

## 2019-09-16 NOTE — Patient Instructions (Signed)
Medroxyprogesterone tablets ?y l thu?c g? MEDROXYPROGESTERONE l n?i ti?t t? thu?c m?t nhm g?i l progestins. N th??ng ???c dng ?? phng ng?a tnh tr?ng nim m?c t? cung pht tri?n qu m?c ? nh?ng ph? n? dng estrogen sau khi mn kinh. N c?ng ???c dng ?? ?i?u tr? kinh nguy?t khng ??u ho?c ch?ng v kinh ? ph? n?. Thu?c ny c th? ???c dng cho nh?ng m?c ?ch khc; hy h?i ng??i cung c?p d?ch v? y t? ho?c d??c s? c?a mnh, n?u qu v? c th?c m?c. (CC) NHN HI?U PH? BI?N: Amen, Provera Ti c?n ph?i bo cho ng??i cung c?p d?ch v? y t? c?a mnh ?i?u g tr??c khi dng thu?c ny? H? c?n bi?t li?u qu v? hi?n c b?t k? tnh tr?ng no sau ?y hay khng:  b?nh m?ch mu ho?c ti?n s? c mu ?ng trong ph?i ho?c c?ng chn  ung th? v, c? t? cung, ho?c m ??o  b?nh tim  b?nh th?n  b?nh gan  ch?ng ?au n?a ??u  s?y thai ho?c ph thai g?n ?y  tr?m c?m  ch?ng ?au n?a ??u  co gi?t (kinh phong)  ??t qu?  ra huy?t m ??o ch?a ???c ?nh gi  pha?n ??ng b?t th???ng ho??c di? ??ng v??i medroxyprogesterone  pha?n ??ng b?t th???ng ho??c di? ??ng v??i ca?c d??c ph?m kha?c  pha?n ??ng b?t th???ng ho??c di? ??ng v??i th??c ph?m, thu?c nhu?m, ho??c ch?t ba?o qua?n  ?ang c thai ho??c ??nh co? thai  ?ang cho con bu? Ti nn s? d?ng thu?c ny nh? th? no? U?ng thu?c ny v?i m?t ly n??c. Hy lm theo cc h??ng d?n trn h?p thu?c ho?c nhn thu?c. Dng thu?c ny vo nh?ng kho?ng th?i gian ??u nhau. Khng ???c dng thu?c ny nhi?u l?n h?n ? ???c ch? d?n. Hy bn v?i bc s? nhi khoa c?a qu v? v? vi?c dng thu?c ny ? tr? em. C th? c?n ch?m  ??c bi?t. Thu?c ny c th? ???c k toa cho tr? em ch? m?i 13 tu?i trong nh?ng tr??ng h?p ch?n l?c, nh?ng c?n ph?i th?n tr?ng. Qu li?u: N?u qu v? cho r?ng mnh ? dng qu nhi?u thu?c ny, th hy lin l?c v?i trung tm ki?m sot ch?t ??c ho?c phng c?p c?u ngay l?p t?c. L?U : Thu?c ny ch? dnh ring cho qu v?. Khng chia s? thu?c ny v?i  nh?ng ng??i khc. N?u ti l? qun m?t li?u th sao? N?u qu v? l? qun m?t li?u thu?c, hy dng li?u thu?c ? ngay khi c th?. N?u h?u nh? ? ??n gi? dng li?u thu?c k? ti?p, th ch? dng li?u thu?c k? ti?p ?Marland Kitchen Khng ???c dng li?u g?p ?i ho?c dng thm li?u. Nh?ng g c th? t??ng tc v?i thu?c ny?  cc thu?c nhm barbiturate dng ?? gy ng? ho?c ?? ?i?u tr? cc ch?ng co gi?t  bosentan  carbamazepine  phenytoin  rifampin  cy [facility] John's Wort (c? [facility] John/cy n?c s?i/cy l?nh) Danh sch ny c th? khng m t? ?? h?t cc t??ng tc c th? x?y ra. Hy ??a cho ng??i cung c?p d?ch v? y t? c?a mnh danh sch t?t c? cc thu?c, th?o d??c, cc thu?c khng c?n toa, ho?c cc ch? ph?m b? sung m qu v? dng. C?ng nn bo cho h? bi?t r?ng qu v? c ht thu?c, u?ng r??u, ho?c c s? d?ng ma ty tri php hay khng. Vi th? c th? t??ng tc v?i thu?c c?a qu  v?. Ti c?n ph?i theo di ?i?u g trong khi dng thu?c ny? Hy ?i g?p bc s? ho?c [place] vin y t? ?? theo di ??nh k? s? c?i thi?n c?a qu v?. Qu v? s? c?n ph?i ?i khm v v vng x??ng ch?u ??nh k?. N?u qu v? c b?t k? l do no ?? ngh? r?ng qu v? ?ang c [place], th hy ng?ng dng thu?c ny ngay l?p t?c v lin l?c v?i bc s? ho?c chuyn vin y t?. Ti c th? nh?n th?y nh?ng tc d?ng ph? no khi dng thu?c ny? Nh?ng tc d?ng ph? qu v? c?n ph?i bo cho bc s? ho?c chuyn vin y t? cng s?m cng t?t:   ?m ? v ho?c xu?t ti?t  thay ??i tm tr?ng ho?c c?m xc, ch?ng h?n nh? tr?m c?m  thay ??i th? l?c v kh n?ng ni n?ng  ?au ? b?ng, ng?c, hng, ho?c chn  ?au ??u d? d?i  da b? n?i ban, ng?a, ho?c n?i my ?ay  kh th? ??t ng?t  m?t m?i ho?c y?u ?t b?t th??ng  vng da ho?c m?t Cc tc d?ng ph? khng c?n ph?i ch?m [place] y t? (hy bo cho bc s? ho?c chuyn vin y t?, n?u cc tc d?ng ph? ny ti?p di?n ho?c gy phi?n toi):  m?n tr?ng c  thay ??i ki?u hnh kinh ho?c l??ng mu hnh kinh  thay ??i ham mu?n tnh d?c  m?c lng trn  m?t  b? gi? n??c v s?ng  ?au ??u  kh ch?u ? bao t?  t?ng cn ho?c s?t cn Danh sch ny c th? khng m t? ?? h?t cc tc d?ng ph? c th? x?y ra. Xin g?i t?i bc s? c?a mnh ?? ???c c? v?n chuyn mn v? cc tc d?ng ph?Ladell Heads. Qu v? c th? t??ng trnh cc tc d?ng ph? cho FDA theo s? [phone]. Ti nn c?t gi? thu?c c?a mnh ? ?u? ?? ngoi t?m tay tr? em. C?t gi? ? nhi?t ?? phng t? 20 ??n 25 ?? C (68 ??n 77 ?? F). V?t b? t?t c? thu?c ch?a dng sau ngy h?t h?n in trn nhn thu?c ho?c bao thu?c. L?U : ?y l b?n tm t?t. N c th? khng bao hm t?t c? thng tin c th? c. N?u qu v? th?c m?c v? thu?c ny, xin trao ??i v?i bc s?, d??c s?, ho?c ng??i cung c?p d?ch v? y t? c?a mnh.  2020 Elsevier/Gold Standard (2016-10-24 00:00:00) Etonogestrel implant ?y l thu?c g? ETONOGESTREL l d?ng c? trnh [place] (ng?a [place]). N ???c dng ?? ng?a [place]. N c th? ???c s? d?ng cho t?i 3 n?m. Thu?c ny c th? ???c dng cho nh?ng m?c ?ch khc; hy h?i ng??i cung c?p d?ch v? y t? ho?c d??c s? c?a mnh, n?u qu v? c th?c m?c. (CC) NHN HI?U PH? BI?N: Implanon, Nexplanon Ti c?n ph?i bo cho ng??i cung c?p d?ch v? y t? c?a mnh ?i?u g tr??c khi dng thu?c ny? H? c?n bi?t li?u qu v? c b?t k? tnh tr?ng no sau ?y khng:  xu?t huy?t m ??o b?t th??ng  b?nh m?ch mu ho?c c?c mu ?ng  ung th? v, c? t? cung, nim m?c t? cung, bu?ng tr?ng, gan ho?c t? cung  b?nh ti?u ???ng  b?nh ti m?t  b?nh tim ho?c b? nh?i mu c? tim m?i ?y  huy?t a?p cao  cao cholesterol ho?c cc ch?t triglycerides  b?nh th?n  b?nh gan  ch?ng ?au n?a ??u  co gi?t (kinh phong)  ??t qu?  ng??i hu?t thu?c la?  c ph?n ?ng b?t th??ng ho?c d? ?ng v?i etonogestrel ho?c thu?c t ho?c thu?c kh? trng  pha?n ??ng b?t th???ng ho??c di? ??ng v??i ca?c d??c ph?m kha?c, th?c ph?m, thu?c nhu?m, ho??c ch?t ba?o qua?n  ?ang c thai ho??c ??nh co? thai  ?ang cho con bu? Ti nn s? d?ng thu?c ny nh? th?  no? D?ng c? ny s? ???c chuyn vin y t? c?y vo ngay d??i da ? m?t trong c?a cnh tay trn. Hy bn v?i bc s? nhi khoa c?a qu v? v? vi?c dng thu?c ny ? tr? em. C th? c?n ch?m [place] ??c bi?t. Qu li?u: N?u qu v? cho r?ng mnh ? dng qu nhi?u thu?c ny, th hy lin l?c v?i trung tm ki?m sot ch?t ??c ho?c phng c?p c?u ngay l?p t?c. L?U : Thu?c ny ch? dnh ring cho qu v?. Khng chia s? thu?c ny v?i nh?ng ng??i khc. N?u ti l? qun m?t li?u th sao? ?i?u ny khng p d?ng. Nh?ng g c th? t??ng tc v?i thu?c ny? Khng ???c dng thu?c ny cng v?i b?t k? th? no sau ?y:  amprenavir  fosamprenavir Thu?c ny c?ng c th? t??ng tc v?i cc thu?c sau ?y:  acitretin  aprepitant  armodafinil  bexarotene  bosentan  carbamazepine  m?t s? thu?c dng ?? tr? cc b?nh nhi?m n?m, ch?ng h?n nh? fluconazole, ketoconazole, itraconazole v voriconazole  m?t s? thu?c dng ?? ?i?u tr? vim gan, nhi?m HIV ho?c AIDS  cyclosporine  felbamate  griseofulvin  lamotrigine  modafinil  oxcarbazepine  phenobarbital  phenytoin  primidone  rifabutin  rifampicin  rifapentine  cy [facility] John's Wort (c? [facility]. John/cy n?c s?i/cy l?nh)  topiramate Danh sch ny c th? khng m t? ?? h?t cc t??ng tc c th? x?y ra. Hy ??a cho ng??i cung c?p d?ch v? y t? c?a mnh danh sch t?t c? cc thu?c, th?o d??c, cc thu?c khng c?n toa, ho?c cc ch? ph?m b? sung m qu v? dng. C?ng nn bo cho h? bi?t r?ng qu v? c ht thu?c, u?ng r??u, ho?c c s? d?ng ma ty tri php hay khng. Vi th? c th? t??ng tc v?i thu?c c?a qu v?. Ti c?n ph?i theo di ?i?u g trong khi dng thu?c ny? Thu?c ny khng b?o v? cho qu v? kh?i b? nhi?m HIV ho?c AIDS ho?c cc b?nh ly qua ???ng tnh d?c khc. ?t h?n qu v? c th? s? ???c d?ng c? ? c?y khi ?n ??u ngn tay ln vng da n?i n ???c c?y vo. Hy lin l?c v?i bc s? c?a mnh, n?u qu v? khng th? c?m nh?n ???c thu?c c?y, v hy s? d?ng bi?n php ng?a  [place] khng s? d?ng n?i ti?t t? (ch?ng h?n nh? bao cao su) cho t?i khi bc s? c?a qu v? xc nh?n r?ng thu?c c?y v?n ? t?i ch?. Hy lin l?c v?i bc s? c?a mnh, n?u qu v? ngh? r?ng thu?c c?y c th? ? b? v? ho?c b? u?n cong khi thu?c cn ? bn trong cnh tay c?a mnh. Qu v? s? nh?n ???c th? dnh cho ng??i dng t? bc s? ho?c [place] vin y t? c?a mnh sau khi ???c ??t thu?c c?y. Th? ny l h? s? ghi l?i v? tr c?a thu?c c?y trong cnh tay trn c?a qu v? v khi no th nn l?y thu?c c?y ra.  Hy gi? th? ny cng v?i cc h? s? y t? c?a mnh. Ti c th? nh?n th?y nh?ng tc d?ng ph? no khi dng thu?c ny? Nh?ng tc d?ng ph? qu v? c?n ph?i bo cho bc s? ho?c chuyn vin y t? cng s?m cng t?t:  cc ph?n ?ng d? ?ng, ch?ng h?n nh? da b? m?n ??, ng?a, n?i my ?ay, s?ng ? m?t, mi, ho?c l??i  c u c?c ? v, c cc thay ??i c?a m ? v, hay c tnh tr?ng xu?t ti?t  kh th?  cc thay ??i c?m xc ho?c tm tr?ng  khi qu v? c c?m gic r?ng thu?c c?y c th? ? b? v? ho?c b? u?n cong khi thu?c cn ? bn trong cnh tay c?a mnh  huy?t a?p cao  ?au, kch ?ng, s?ng ho?c b?m tm ? ch? c?y thu?c  b? s?o ? ch? c?y thu?c  cc d?u hi?u nhi?m trng ? ch? c?y thu?c ch?ng h?n nh? s?t, v da m?n ??, ?au ho?c ti?t d?ch  cc d?u hi?u v tri?u ch?ng c?a c?c mu ?ng, nh? l cc v?n ?? v? h h?p; thay ??i th? l?c; ?au ng?c; ?au ??u ??t ng?t v d? d?i; ?au, s?ng, nng ? chn; ni kh; t ho?c y?u ??t ng?t ? m?t, tay ho?c chn  cc d?u hi?u v tri?u ch?ng t?n th??ng gan, ch?ng h?n nh? n??c ti?u mu nu ho?c vng s?m; c?m gic b? b?nh ki?u chung chung ho?c cc tri?u ch?ng gi?ng nh? cm; phn b?c mu; m?t c?m gic ngon mi?ng; bu?n i; ?au vng b?ng trn; y?u ?t ho?c m?t m?i khc th??ng; vng da ho?c m?t  d?ch ti?t, xu?t huy?t m ??o b?t th??ng Cc tc d?ng ph? khng c?n ph?i ch?m [place] y t? (hy bo cho bc s? ho?c chuyn vin y t?, n?u cc tc d?ng ph? ny ti?p di?n ho?c gy phi?n toi):  m?n tr?ng c  b?  ?m ho?c  ?au v  ?au ??u  hnh kinh khng ??u  bu?n i Danh sch ny c th? khng m t? ?? h?t cc tc d?ng ph? c th? x?y ra. Xin g?i t?i bc s? c?a mnh ?? ???c c? v?n chuyn mn v? cc tc d?ng ph?Sander Nephew v? c th? t??ng trnh cc tc d?ng ph? cho FDA theo s? [phone]. Ti nn c?t gi? thu?c c?a mnh ? ?u? Thu?c ny ???c s? d?ng b?i chuyn vin y t? ? b?nh vi?n ho?c ? phng m?ch. Qu v? s? khng ???c c?p thu?c ny ?? c?t gi? t?i nh. L?U : ?y l b?n tm t?t. N c th? khng bao hm t?t c? thng tin c th? c. N?u qu v? th?c m?c v? thu?c ny, xin trao ??i v?i bc s?, d??c s?, ho?c ng??i cung c?p d?ch v? y t? c?a mnh.  2020 Elsevier/Gold Standard (2017-10-31 00:00:00)

## 2019-09-23 NOTE — Progress Notes (Signed)
53 y.o. [id number] Married [place] female here for annual exam.    Interpretor present for the visit.  Recently seen for pelvic US and dx with pelvic congestion.  She was seen for left sided pelvic pain.  We discussed MPA 30 mg daily and Nexplanon for this after updating her mammogram.   She has chronic pelvic pain (right and left side) and also hx of spinal stenosis. A CT scan on 05/13/17 for RLQ pain and diarrhea.  She had a small hemorrhagic ovarian cyst then and evidence of spinal stenosis due to a calcified central disc protrusion.  She has been seen by her PCP and by GI for her pain.   She had light spotting in May and then more regular menses in May, 2020.   She also had a left breast mass and right breast pain was scheduled for a bilateral dx mammogram and bilateral breast US, which is scheduled for 10/07/19.   PCP:  Grier Mitts, MD   Patient's last menstrual period was 02/04/2018 (approximate).           Sexually active: Yes.    The current method of family planning is post menopausal status.    Exercising: Yes.    treadmill, stretches Smoker:  no  Health Maintenance: Pap: 03-13-17 Neg, 10-04-15 Neg History of abnormal Pap:  no MMG: 08-05-17 Neg/density C/BiRads1--appt. 10-07-19 Colonoscopy: 02-24-18 normal;next 10 years BMD:   n/a  Result  n/a TDaP:  07-27-15 Gardasil:   no HIV: Unsure Hep C:Unsure Screening Labs:  Today. Flu vaccine:  Completed in October, 2020.     reports that she has never smoked. She has never used smokeless tobacco. She reports that she does not drink alcohol or use drugs.  Past Medical History:  Diagnosis Date  . Abdominal pain in female    For 3-4 years  . Heart murmur   . Spinal stenosis   . Urinary incontinence     Past Surgical History:  Procedure Laterality Date  . HEMORROIDECTOMY      Current Outpatient Medications  Medication Sig Dispense Refill  . AMBULATORY NON FORMULARY MEDICATION Nitroglycerin ointment 0.125% Use  pea sized amount per rectum for two months three times daily 30 g 0  . docusate sodium (COLACE) 100 MG capsule Take 100 mg by mouth.    Marland Kitchen ibuprofen (ADVIL) 800 MG tablet Take 1 tablet (800 mg total) by mouth every 8 (eight) hours as needed. 30 tablet 0   Current Facility-Administered Medications  Medication Dose Route Frequency Provider Last Rate Last Admin  . 0.9 %  sodium chloride infusion  500 mL Intravenous Once Nandigam, Venia Minks, MD        Family History  Problem Relation Age of Onset  . Other Mother        Lung Disease??  . Cirrhosis Father   . Colon cancer Neg Hx   . Colon polyps Neg Hx   . Esophageal cancer Neg Hx     Review of Systems  All other systems reviewed and are negative.   Exam:   BP 108/60   Pulse 76   Temp (!) 97.3 F (36.3 C) (Temporal)   Resp 20   Ht 5' (1.524 m)   Wt 121 lb (54.9 kg)   LMP 02/04/2018 (Approximate)   BMI 23.63 kg/m     General appearance: alert, cooperative and appears stated age Head: normocephalic, without obvious abnormality, atraumatic Neck: no adenopathy, supple, symmetrical, trachea midline and thyroid normal to inspection and palpation Lungs:  clear to auscultation bilaterally Breasts: normal appearance, no masses or tenderness, No nipple retraction or dimpling, No nipple discharge or bleeding, No axillary adenopathy Heart: regular rate and rhythm Abdomen: soft, non-tender; no masses, no organomegaly Extremities: extremities normal, atraumatic, no cyanosis or edema Skin: skin color, texture, turgor normal. No rashes or lesions Lymph nodes: cervical, supraclavicular, and axillary nodes normal. Neurologic: grossly normal  Pelvic: External genitalia:  no lesions              No abnormal inguinal nodes palpated.              Urethra:  normal appearing urethra with no masses, tenderness or lesions              Bartholins and Skenes: normal                 Vagina: normal appearing vagina with normal color and discharge, no  lesions              Cervix: no lesions              Pap taken: Yes.   Bimanual Exam:  Uterus:  normal size, contour, position, consistency, mobility, non-tender              Adnexa: no mass, fullness, tenderness              Rectal exam: Yes.  .  Confirms.              Anus:  normal sphincter tone, no lesions  Chaperone was present for exam.  Assessment:   Well woman visit with normal exam. Perimenopausal female. Pelvic congestion.  Chronic lower abdominal pain.  Spinal stenosis and herniated disc. Hx left breast lump and right breast pain.  Normal exam today.  Plan: Mammogram dx with right and left breast US scheduled for 10/07/19.  Self breast awareness reviewed. Pap and HR HPV as above. Guidelines for Calcium, Vitamin D, regular exercise program including cardiovascular and weight bearing exercise. Will plan for MPA 30 mg daily after mammogram is back and normal.  Rx for Motrin 800 mg po q 8 hours prn.  Patient will likely need to see a spine specialist, as I do not see this type of consultation on chart review. Routine labs today. Follow up annually and prn.   After visit summary provided.

## 2019-09-29 ENCOUNTER — Ambulatory Visit: Payer: BC Managed Care – PPO | Admitting: Obstetrics and Gynecology

## 2019-09-29 ENCOUNTER — Other Ambulatory Visit: Payer: Self-pay

## 2019-09-29 ENCOUNTER — Other Ambulatory Visit (HOSPITAL_COMMUNITY)
Admission: RE | Admit: 2019-09-29 | Discharge: 2019-09-29 | Disposition: A | Discharge status: Home / Self Care | Payer: BC Managed Care – PPO | Source: Ambulatory Visit | Attending: Obstetrics and Gynecology | Admitting: Obstetrics and Gynecology

## 2019-09-29 ENCOUNTER — Encounter: Payer: Self-pay | Admitting: Obstetrics and Gynecology

## 2019-09-29 VITALS — BP 108/60 | HR 76 | Temp 97.3°F | Resp 20 | Ht 60.0 in | Wt 121.0 lb

## 2019-09-29 DIAGNOSIS — R7309 Other abnormal glucose: Secondary | ICD-10-CM

## 2019-09-29 DIAGNOSIS — Z01419 Encounter for gynecological examination (general) (routine) without abnormal findings: Secondary | ICD-10-CM | POA: Insufficient documentation

## 2019-09-29 DIAGNOSIS — E7800 Pure hypercholesterolemia, unspecified: Secondary | ICD-10-CM | POA: Diagnosis not present

## 2019-09-29 DIAGNOSIS — R748 Abnormal levels of other serum enzymes: Secondary | ICD-10-CM | POA: Diagnosis not present

## 2019-09-29 MED ORDER — IBUPROFEN 800 MG PO TABS: 800.0000 mg | ORAL_TABLET | Freq: Three times a day (TID) | ORAL | 0 refills | Status: DC | PRN

## 2019-09-29 NOTE — Patient Instructions (Signed)

## 2019-09-30 LAB — COMPREHENSIVE METABOLIC PANEL
ALT: 30 IU/L (ref 0–32)
AST: 36 IU/L (ref 0–40)
Albumin/Globulin Ratio: 1.6 (ref 1.2–2.2)
Albumin: 4.5 g/dL (ref 3.8–4.9)
Alkaline Phosphatase: 122 IU/L — ABNORMAL HIGH (ref 39–117)
BUN/Creatinine Ratio: 16 (ref 9–23)
BUN: 13 mg/dL (ref 6–24)
Bilirubin Total: 0.3 mg/dL (ref 0.0–1.2)
CO2: 28 mmol/L (ref 20–29)
Calcium: 9.5 mg/dL (ref 8.7–10.2)
Chloride: 102 mmol/L (ref 96–106)
Creatinine, Ser: 0.81 mg/dL (ref 0.57–1.00)
GFR calc Af Amer: 97 mL/min/{1.73_m2} (ref >59)
GFR calc non Af Amer: 84 mL/min/{1.73_m2} (ref >59)
Globulin, Total: 2.8 g/dL (ref 1.5–4.5)
Glucose: 104 mg/dL — ABNORMAL HIGH (ref 65–99)
Potassium: 3.8 mmol/L (ref 3.5–5.2)
Sodium: 142 mmol/L (ref 134–144)
Total Protein: 7.3 g/dL (ref 6.0–8.5)

## 2019-09-30 LAB — LIPID PANEL
Chol/HDL Ratio: 3 ratio (ref 0.0–4.4)
Cholesterol, Total: 236 mg/dL — ABNORMAL HIGH (ref 100–199)
HDL: 78 mg/dL (ref >39)
LDL Chol Calc (NIH): 143 mg/dL — ABNORMAL HIGH (ref 0–99)
Triglycerides: 87 mg/dL (ref 0–149)
VLDL Cholesterol Cal: 15 mg/dL (ref 5–40)

## 2019-09-30 LAB — CBC
Hematocrit: 40.8 % (ref 34.0–46.6)
Hemoglobin: 13.5 g/dL (ref 11.1–15.9)
MCH: 28.9 pg (ref 26.6–33.0)
MCHC: 33.1 g/dL (ref 31.5–35.7)
MCV: 87 fL (ref 79–97)
Platelets: 326 10*3/uL (ref 150–450)
RBC: 4.67 x10E6/uL (ref 3.77–5.28)
RDW: 13.2 % (ref 11.7–15.4)
WBC: 6 10*3/uL (ref 3.4–10.8)

## 2019-10-04 LAB — CYTOLOGY - PAP
Comment: NEGATIVE
Diagnosis: NEGATIVE
High risk HPV: NEGATIVE

## 2019-10-06 NOTE — Addendum Note (Signed)
Addended by: Yisroel Ramming, Dietrich Pates E on: 10/06/2019 05:32 PM   Modules accepted: Orders

## 2019-10-07 ENCOUNTER — Other Ambulatory Visit: Payer: Self-pay

## 2019-10-07 ENCOUNTER — Ambulatory Visit
Admission: RE | Admit: 2019-10-07 | Discharge: 2019-10-07 | Disposition: A | Discharge status: Home / Self Care | Payer: BC Managed Care – PPO | Source: Ambulatory Visit | Attending: Obstetrics and Gynecology | Admitting: Obstetrics and Gynecology

## 2019-10-07 ENCOUNTER — Telehealth: Payer: Self-pay | Admitting: *Deleted

## 2019-10-07 DIAGNOSIS — N644 Mastodynia: Secondary | ICD-10-CM

## 2019-10-07 DIAGNOSIS — N6489 Other specified disorders of breast: Secondary | ICD-10-CM | POA: Diagnosis not present

## 2019-10-07 DIAGNOSIS — R922 Inconclusive mammogram: Secondary | ICD-10-CM | POA: Diagnosis not present

## 2019-10-07 DIAGNOSIS — N6320 Unspecified lump in the left breast, unspecified quadrant: Secondary | ICD-10-CM

## 2019-10-07 NOTE — Telephone Encounter (Signed)
Burnice Logan, RN  10/07/2019 10:00 AM EST    02 recall    Burnice Logan, RN  10/07/2019 9:55 AM EST    Call placed to patient using [employer] interpretor ID # [id number]. Left message to call Sharee Pimple, RN at [place].

## 2019-10-07 NOTE — Telephone Encounter (Signed)
-----   Message from Nunzio Cobbs, MD sent at 10/06/2019  5:32 PM EST ----- Please contact patient with results of her testing.  Her total cholesterol and LDL were elevated.  She can lower this by following a diet low in cholesterol and by increasing vigorous exercise.  Her blood sugar was mildly elevate and so was her alkaline phosphatase.   I do recommend follow up on these results by having her return to do fasting labs.  Please make an appointment for a recheck with me and labs visit in 6 weeks.  I will place future orders.  Her pap is normal and HR HPV is negative.  Annual exam recall - 02.

## 2019-10-08 DIAGNOSIS — R3129 Other microscopic hematuria: Secondary | ICD-10-CM

## 2019-10-08 HISTORY — DX: Other microscopic hematuria: R31.29

## 2019-10-11 ENCOUNTER — Ambulatory Visit: Payer: BC Managed Care – PPO | Attending: Internal Medicine

## 2019-10-11 DIAGNOSIS — Z20822 Contact with and (suspected) exposure to covid-19: Secondary | ICD-10-CM

## 2019-10-11 DIAGNOSIS — U071 COVID-19: Secondary | ICD-10-CM

## 2019-10-11 HISTORY — DX: COVID-19: U07.1

## 2019-10-12 LAB — NOVEL CORONAVIRUS, NAA: SARS-CoV-2, NAA: DETECTED — AB

## 2019-10-12 NOTE — Telephone Encounter (Signed)
Call placed to patient using [employer] interpretor ID [id number], Left message on home number, no answer, no voicemail set up.  Also called patients daughter, Cristela Blue, Left message to call Noreene Larsson, RN at [facility] [phone].    Removed from [facility] hold.

## 2019-10-12 NOTE — Telephone Encounter (Signed)
-----   Message from Patton Salles, MD sent at 10/11/2019  3:06 PM EST ----- Please contact patient in follow up to her diagnostic bilateral breast imaging, which is normal.  She had a palpable mass on exam of her left breast on 09/16/19.  I recommend she have a left breast recheck in 2 weeks. We will discuss treatment for her pelvic pain at that time.   She will need an interpretor for the visit.   She may be removed from mammogram hold.

## 2019-10-14 ENCOUNTER — Telehealth: Payer: Self-pay

## 2019-10-14 NOTE — Telephone Encounter (Signed)
Results viewed in MyChart.See result note.

## 2019-10-15 NOTE — Telephone Encounter (Signed)
Call placed to daughter, Cristela Blue, ok per dpr.  Left message to call Noreene Larsson, RN at [facility] [phone].

## 2019-10-15 NOTE — Telephone Encounter (Signed)
Spoke with patients daughter, Cristela Blue, ok per dpr.  Advised of all results as seen below per Dr. Edward Jolly.  Patient tested positive for Covid19 on 10/11/19.  OV for breast recheck, labs and pelvic pain scheduled for 11/22/19 at 10am.   Daughter verbalizes understanding and is agreeable.   Routing to provider for final review. Patient is agreeable to disposition. Will close encounter.

## 2019-11-18 NOTE — Progress Notes (Signed)
GYNECOLOGY  VISIT   HPI: 54 y.o.   Married  [place]  female   [phone] with Patient's last menstrual period was 02/05/2019 (approximate).   here for follow up breast check and recheck of pelvic pain.   She has a left breast mass 10 x 7 mm at 10:00 noted on prior examination. See breast imaging below.   Patient complaining of lower pelvic pain x1 month and is worse in LLQ and midline pelvic pain. It is worsening, especially in the past week. She denies any change in bowel habits or any urinary symptoms. Pain hurts every day, especially this week.  Denies constipation.  Last BM was this am.  Firm and small bowel movement.   Patient was prescribed MPA 30 mg daily for pelvic congestion noted on pelvic US on 09/15/20. She has chronic RLQ and LLQ pain.  This has helped her pain somewhat, but she stopped this one month ago when she had Covid.   She has spinal stenosis and central disc protrusion by CT on 05/13/17. She has had spinal injections years ago.  Normal colonoscopy 02/24/18.  Her LMP was May, 2020.  Urine - trace RBCs.   GYNECOLOGIC HISTORY: Patient's last menstrual period was 02/05/2019 (approximate). Contraception: Postmenopausal Menopausal hormone therapy:  none Last mammogram: 10-07-19 Diag.Bil.w/Bil.US--Neg/density C/BiRads1 Last pap smear: 09-29-19 Neg:Neg HR HPV, 03-13-17 Neg, 10-04-15 Neg        OB History    Gravida  2   Para  2   Term  2   Preterm      AB      Living  2     SAB      TAB      Ectopic      Multiple      Live Births                 Patient Active Problem List   Diagnosis Date Noted  . Spinal stenosis 12/19/2017  . Pelvic pain in female 10/04/2015  . Dyspareunia in female 10/04/2015  . Encounter for routine gynecological examination 10/04/2015  . Abdominal pain, chronic, right lower quadrant 07/27/2015    Past Medical History:  Diagnosis Date  . Abdominal pain in female    For 3-4 years  . COVID-19 10/11/2019  . Heart  murmur   . Spinal stenosis   . Urinary incontinence     Past Surgical History:  Procedure Laterality Date  . HEMORROIDECTOMY      Current Outpatient Medications  Medication Sig Dispense Refill  . AMBULATORY NON FORMULARY MEDICATION Nitroglycerin ointment 0.125% Use pea sized amount per rectum for two months three times daily 30 g 0  . docusate sodium (COLACE) 100 MG capsule Take 100 mg by mouth.    Marland Kitchen ibuprofen (ADVIL) 200 MG tablet Take 200 mg by mouth every 6 (six) hours as needed.     Current Facility-Administered Medications  Medication Dose Route Frequency Provider Last Rate Last Admin  . 0.9 %  sodium chloride infusion  500 mL Intravenous Once Nandigam, Venia Minks, MD         ALLERGIES: Patient has no known allergies.  Family History  Problem Relation Age of Onset  . Other Mother        Lung Disease??  . Cirrhosis Father   . Colon cancer Neg Hx   . Colon polyps Neg Hx   . Esophageal cancer Neg Hx     Social History   Socioeconomic History  . Marital status: Married  Spouse name: Not on file  . Number of children: Not on file  . Years of education: Not on file  . Highest education level: Not on file  Occupational History  . Not on file  Tobacco Use  . Smoking status: Never Smoker  . Smokeless tobacco: Never Used  Substance and Sexual Activity  . Alcohol use: No  . Drug use: No  . Sexual activity: Yes    Partners: Male    Birth control/protection: None  Other Topics Concern  . Not on file  Social History Narrative  . Not on file   Social Determinants of Health   Financial Resource Strain:   . Difficulty of Paying Living Expenses: Not on file  Food Insecurity:   . Worried About Programme researcher, broadcasting/film/video in the Last Year: Not on file  . Ran Out of Food in the Last Year: Not on file  Transportation Needs:   . Lack of Transportation (Medical): Not on file  . Lack of Transportation (Non-Medical): Not on file  Physical Activity:   . Days of Exercise per  Week: Not on file  . Minutes of Exercise per Session: Not on file  Stress:   . Feeling of Stress : Not on file  Social Connections:   . Frequency of Communication with Friends and Family: Not on file  . Frequency of Social Gatherings with Friends and Family: Not on file  . Attends Religious Services: Not on file  . Active Member of Clubs or Organizations: Not on file  . Attends Banker Meetings: Not on file  . Marital Status: Not on file  Intimate Partner Violence:   . Fear of Current or Ex-Partner: Not on file  . Emotionally Abused: Not on file  . Physically Abused: Not on file  . Sexually Abused: Not on file    Review of Systems  Respiratory: Positive for chest tightness (feels better when she doesn't wear her bra).   Genitourinary: Positive for pelvic pain.  All other systems reviewed and are negative.   PHYSICAL EXAMINATION:    BP 98/60   Pulse 76   Temp (!) 97.1 F (36.2 C) (Temporal)   Ht 5' (1.524 m)   Wt 121 lb 9.6 oz (55.2 kg)   LMP 02/05/2019 (Approximate)   BMI 23.75 kg/m     General appearance: alert, cooperative and appears stated age   Breasts: normal appearance, no masses or tenderness, No nipple retraction or dimpling, No nipple discharge or bleeding, No axillary or supraclavicular adenopathy   Abdomen: soft, non-tender, no masses,  no organomegaly   Pelvic: External genitalia: inflamed hair follicle -  Mm.               Urethra:  normal appearing urethra with no masses, tenderness or lesions              Bartholins and Skenes: normal                 Vagina: normal appearing vagina with normal color and discharge, no lesions              Cervix: no lesions                Bimanual Exam:  Uterus:  normal size, contour, position, consistency, mobility, non-tender              Adnexa: no mass, fullness, tenderness              Rectal exam: Yes.  Marland Kitchen  Confirms.              Anus:  normal sphincter tone, no lesions  Chaperone was present for  exam.  ASSESSMENT  LLQ pain and chronic pelvic pain.  Likely not gynecologic.  Pelvic congestion on pelvic ultrasound.   Microscopic hematuria.  Constipation. Normal breast exam.   PLAN  Urine micro and culture. Stop MPA.  Will get CT of abdomen and pelvis with contrast.  Will refer to spine specialist.    An After Visit Summary was printed and given to the patient.  __30____ minutes face to face time of which over 50% was spent in counseling.

## 2019-11-22 ENCOUNTER — Other Ambulatory Visit: Payer: Self-pay

## 2019-11-22 ENCOUNTER — Encounter: Payer: Self-pay | Admitting: Obstetrics and Gynecology

## 2019-11-22 ENCOUNTER — Ambulatory Visit (INDEPENDENT_AMBULATORY_CARE_PROVIDER_SITE_OTHER): Payer: BC Managed Care – PPO | Admitting: Obstetrics and Gynecology

## 2019-11-22 VITALS — BP 98/60 | HR 76 | Temp 97.1°F | Ht 60.0 in | Wt 121.6 lb

## 2019-11-22 DIAGNOSIS — G8929 Other chronic pain: Secondary | ICD-10-CM

## 2019-11-22 DIAGNOSIS — R1032 Left lower quadrant pain: Secondary | ICD-10-CM

## 2019-11-22 DIAGNOSIS — M4800 Spinal stenosis, site unspecified: Secondary | ICD-10-CM

## 2019-11-22 DIAGNOSIS — K5900 Constipation, unspecified: Secondary | ICD-10-CM

## 2019-11-22 DIAGNOSIS — R319 Hematuria, unspecified: Secondary | ICD-10-CM | POA: Diagnosis not present

## 2019-11-22 DIAGNOSIS — R102 Pelvic and perineal pain: Secondary | ICD-10-CM

## 2019-11-22 DIAGNOSIS — M5126 Other intervertebral disc displacement, lumbar region: Secondary | ICD-10-CM

## 2019-11-22 LAB — POCT URINALYSIS DIPSTICK
Bilirubin, UA: NEGATIVE
Glucose, UA: NEGATIVE
Ketones, UA: NEGATIVE
Leukocytes, UA: NEGATIVE
Nitrite, UA: NEGATIVE
Protein, UA: NEGATIVE
Urobilinogen, UA: 0.2 E.U./dL
pH, UA: 5 (ref 5.0–8.0)

## 2019-11-22 NOTE — Patient Instructions (Addendum)
Start taking Miralax daily.  This is a powder you can put in a liquid and take every day if needed to make your bowel function more regular.    To bn, Ng??i l?n Constipation, Adult To bn l khi m?t ng??i ?i ??i ti?n trong m?t tu?n t h?n so v?i bnh th??ng, ??i ti?n kh kh?n, ho?c ??i ti?n ra phn kh, c?ng, ho?c to h?n bnh th??ng. To bn c th? do m?t tnh tr?ng bn trong gy ra. N c th? tr? ln tr?m tr?ng h?n theo ?? tu?i n?u m?t ng??i dng cc lo?i thu?c nh?t ??nh v khng u?ng ?? n??c. Tun th? nh?ng h??ng d?n ny ? nh: ?n v u?ng   ?n th?c ?n c nhi?u ch?t x? nh? tri cy t??i v rau, ng? c?c nguyn h?t v cc lo?i ??u.  H?n ch? th?c ?n c nhi?u ch?t bo, t ch?t x?, ho?c ch? bi?n s?n qu m?c, ch?ng h?n khoai ty chin, bnh hamburger, bnh quy, k?o v soda.  U?ng ?? n??c ?? gi? cho n??c ti?u trong ho?c c mu vng nh?t. H??ng d?n chung  T?p th? d?c th??ng xuyn theo ch? d?n c?a chuyn gia ch?m [place] s?c kh?e.  ?i v? sinh ngay khi qu v? c nhu c?u. Khng nh?n ?i ??i ti?n.  Ch? s? d?ng thu?c khng k ??n v thu?c k ??n theo ch? d?n c?a chuyn gia ch?m [place] s?c kh?e. Cc thu?c ny bao g?m b?t k? th?c ph?m ch?c n?ng c ch?t x? no.  Th?c hnh cc bi t?p luy?n l?i c? ?y ch?u, ch?ng h?n nh? th? su trong lc th? gin b?ng d??i v th? gin ph?n ?y ch?u trong khi ??i ti?n.  Theo di tnh tr?ng c?a qu v? ?? pht hi?n b?t k? thay ??i no.  Tun th? t?t c? cc l?n khm theo di theo ch? d?n c?a chuyn gia ch?m [place] s?c kh?e. ?i?u ny c vai tr quan tr?ng. Hy lin l?c v?i chuyn gia ch?m [place] s?c kh?e n?u:  Qu v? b? ?au tr?m tr?ng h?n.  Qu v? b? s?t.  Qu v? khng ?i ??i ti?n sau 4 ngy.  Qu v? nn.  Qu v? khng ?i.  Qu v? b? s?t cn.  Qu v? b? ch?y mu ? h?u mn.  Phn c?a qu v? m?ng, trng nh? bt ch. Yu c?u tr? gip ngay l?p t?c n?u:  Qu v? b? s?t v cc tri?u ch?ng c?a qu v? ??t nhin tr?m tr?ng h?n.  Qu v? sn phn ho?c c mu trong  phn.  B?ng c?a qu v? b? ch??ng ln.  Qu v? b? ?au r?t nhi?u ? b?ng.  Qu v? c?m th?y chng m?t ho?c ng?t x?u. Thng tin ny khng nh?m m?c ?ch thay th? cho l?i khuyn m chuyn gia ch?m [place] s?c kh?e ni v?i qu v?. Hy b?o ??m qu v? ph?i th?o lu?n b?t k? v?n ?? g m qu v? c v?i chuyn gia ch?m [place] s?c kh?e c?a qu v?. Document Revised: 01/06/2017 Document Reviewed: 03/13/2016 Elsevier Patient Education  2020 [employer].

## 2019-11-23 LAB — URINALYSIS, MICROSCOPIC ONLY
Bacteria, UA: NONE SEEN
Casts: NONE SEEN /lpf

## 2019-11-24 ENCOUNTER — Encounter: Payer: Self-pay | Admitting: Obstetrics and Gynecology

## 2019-11-24 LAB — URINE CULTURE

## 2019-12-02 ENCOUNTER — Telehealth: Payer: Self-pay

## 2019-12-02 NOTE — Telephone Encounter (Signed)
Per DPR, called patient's daughter Macaiah Mangal to discuss test results--left message to call Marchelle Folks, CMA.

## 2019-12-02 NOTE — Telephone Encounter (Signed)
-----   Message from Patton Salles, MD sent at 11/24/2019 12:49 PM EST ----- Results to patient through My Chart.  Hello Maria Garrison,   I am sharing your test results from your recent visit.   Your urine showed evidence of some microscopic blood.  Your urine culture is negative for infection.   I ordered your CT scan of the abdomen and pelvis. You will receive a call to schedule this when this test is approved by your insurance company.   I have placed a referral for you to see a spine doctor.  His name is Dr. Sharolyn Douglas.   Please contact the office for any questions.   Have a good day!  Maria Simmonds, MD

## 2019-12-02 NOTE — Telephone Encounter (Signed)
Patient's daughter Maria Garrison is returning a call to [place].

## 2019-12-07 NOTE — Telephone Encounter (Signed)
Left message for daughter Efraim Kaufmann to call back to triage RN for results.

## 2019-12-07 NOTE — Telephone Encounter (Signed)
Spoke to [place], CT of abd and pelvis appt made for 12/17/2019 at 1040 am.  Left detailed message with daughter Efraim Kaufmann per DPR. Daughter to call back to triage RN with any questions.   Routing to Dr Edward Jolly for review and will close encounter.

## 2019-12-07 NOTE — Telephone Encounter (Signed)
Patient's daughter Efraim Kaufmann returned call.

## 2019-12-07 NOTE — Telephone Encounter (Signed)
Maria Garrison returned call  °

## 2019-12-07 NOTE — Telephone Encounter (Signed)
Spoke to pts daughter Efraim Kaufmann. Given lab results from 11/24/2019. Agreeable and verbalized understanding. Pt has appt with Dr Noel Gerold on 12/09/2019. Pts daughter asking about abd/pelvis CT scan. CT scan orders placed by Dr Edward Jolly on 11/22/2019. Will set up pt's CT scan and return call to [facility] with appt. Daughter agreeable.

## 2019-12-07 NOTE — Telephone Encounter (Signed)
Returned call to patient's daughter, Efraim Kaufmann. Left message to return call to [facility], CMA or Triage nurse.

## 2019-12-09 DIAGNOSIS — M5136 Other intervertebral disc degeneration, lumbar region: Secondary | ICD-10-CM | POA: Diagnosis not present

## 2019-12-09 DIAGNOSIS — M47896 Other spondylosis, lumbar region: Secondary | ICD-10-CM | POA: Diagnosis not present

## 2019-12-09 DIAGNOSIS — M5416 Radiculopathy, lumbar region: Secondary | ICD-10-CM | POA: Diagnosis not present

## 2019-12-09 DIAGNOSIS — M545 Low back pain: Secondary | ICD-10-CM | POA: Diagnosis not present

## 2019-12-13 ENCOUNTER — Telehealth: Payer: Self-pay | Admitting: Obstetrics and Gynecology

## 2019-12-13 NOTE — Telephone Encounter (Signed)
Routing to [place] for [place].

## 2019-12-13 NOTE — Telephone Encounter (Signed)
Pts daugher Efraim Kaufmann calling to change CT scan appt. Pt to call [facility] to change appt and call back to our office to give updated appt. Daughter agreeable.

## 2019-12-13 NOTE — Telephone Encounter (Signed)
Pts daughter Efraim Kaufmann called back to give update on CT scan. Rescheduled for 12/22/2019 at 1:50 pm at [facility].   Routing to Dr Edward Jolly for review and update.

## 2019-12-13 NOTE — Telephone Encounter (Signed)
See phone encounter dated 12/02/2019. Encounter closed.

## 2019-12-13 NOTE — Telephone Encounter (Signed)
Patient's daughter Maria Garrison returning a call to [place].

## 2019-12-17 ENCOUNTER — Other Ambulatory Visit: Payer: BC Managed Care – PPO

## 2019-12-22 ENCOUNTER — Ambulatory Visit
Admission: RE | Admit: 2019-12-22 | Discharge: 2019-12-22 | Disposition: A | Discharge status: Home / Self Care | Payer: BC Managed Care – PPO | Source: Ambulatory Visit | Attending: Obstetrics and Gynecology | Admitting: Obstetrics and Gynecology

## 2019-12-22 ENCOUNTER — Other Ambulatory Visit: Payer: Self-pay

## 2019-12-22 DIAGNOSIS — R1032 Left lower quadrant pain: Secondary | ICD-10-CM | POA: Diagnosis not present

## 2019-12-22 DIAGNOSIS — G8929 Other chronic pain: Secondary | ICD-10-CM

## 2019-12-22 MED ORDER — IOPAMIDOL (ISOVUE-300) INJECTION 61%
100.0000 mL | Freq: Once | INTRAVENOUS | Status: AC | PRN
  Administered 2019-12-22: 15:00:00 100 mL via INTRAVENOUS

## 2019-12-27 ENCOUNTER — Telehealth: Payer: Self-pay | Admitting: *Deleted

## 2019-12-27 NOTE — Telephone Encounter (Signed)
-----   Message from Patton Salles, MD sent at 12/24/2019  3:49 PM EDT ----- Please contact patient with results of CT scan of the abdomen and pelvis. She will need a [place] interpretor.  She has a small left renal cyst.   Her final CT result is considered negative by the radiologist.  No acute or significant abnormality is identified.  The left renal cyst is not expected to cause her chronic LLQ pain.

## 2019-12-27 NOTE — Telephone Encounter (Signed)
Leda Min, RN  12/27/2019 10:23 AM EDT    Call placed to patient using pacific interpretor ID # [phone], Left message to call Noreene Larsson, RN at [facility] [phone].   Removed from IM[facility] hold.

## 2020-01-04 NOTE — Telephone Encounter (Signed)
Call placed to patient using pacific interpretor ID # [id number].  Left message on mobile number to call Noreene Larsson, RN at [facility] [phone].  Called home number, no answer, no option to leave voicemail.

## 2020-01-13 ENCOUNTER — Ambulatory Visit: Payer: BC Managed Care – PPO | Attending: Internal Medicine

## 2020-01-13 DIAGNOSIS — Z23 Encounter for immunization: Secondary | ICD-10-CM

## 2020-01-13 NOTE — Progress Notes (Signed)
   Covid-19 Vaccination Clinic  Name:  Maria Garrison    MRN: [id number] DOB: [birth year]  01/13/2020  [name] was observed post Covid-19 immunization for 15 minutes without incident. She was provided with Vaccine Information Sheet and instruction to access the V-Safe system.   [name] was instructed to call 911 with any severe reactions post vaccine: Marland Kitchen Difficulty breathing  . Swelling of face and throat  . A fast heartbeat  . A bad rash all over body  . Dizziness and weakness   Immunizations Administered    Name Date Dose VIS Date Route   Pfizer COVID-19 Vaccine 01/13/2020  4:57 PM 0.3 mL 09/17/2019 Intramuscular   Manufacturer: [employer], [employer]   Lot: [id number]   NDC: [address]-7

## 2020-01-19 NOTE — Telephone Encounter (Signed)
Call placed to patients daughter, Cristela Blue, ok per dpr. Left message to call Noreene Larsson, RN at [facility] [phone] to discuss results.

## 2020-02-04 ENCOUNTER — Telehealth: Payer: Self-pay

## 2020-02-04 NOTE — Telephone Encounter (Signed)
Spoke with patients daughter, Cristela Blue, ok per dpr. Advised of results as seen below per Dr. Edward Jolly. She is unsure if her mother is still experiencing pain. She will talk with her mother and return call to office if any additional questions or concerns.  Routing to provider for final review. Patient is agreeable to disposition. Will close encounter.

## 2020-02-04 NOTE — Telephone Encounter (Signed)
Patient daughter is returning call to [place]. No open encounter.

## 2020-02-04 NOTE — Telephone Encounter (Signed)
No return call from patient.   Letter with results printed and placed on Dr. Rica Records desk.

## 2020-02-04 NOTE — Telephone Encounter (Signed)
See open telephone encounter dated 12/27/19.   Encounter closed.

## 2020-02-07 ENCOUNTER — Ambulatory Visit: Payer: BC Managed Care – PPO | Attending: Internal Medicine

## 2020-02-07 DIAGNOSIS — Z23 Encounter for immunization: Secondary | ICD-10-CM

## 2020-02-07 NOTE — Progress Notes (Signed)
   Covid-19 Vaccination Clinic  Name:  Maria Garrison    MRN: [id number] DOB: Mar 05, 1966  02/07/2020  Maria Garrison was observed post Covid-19 immunization for 15 minutes without incident. She was provided with Vaccine Information Sheet and instruction to access the V-Safe system.   Maria Garrison was instructed to call 911 with any severe reactions post vaccine: Marland Kitchen Difficulty breathing  . Swelling of face and throat  . A fast heartbeat  . A bad rash all over body  . Dizziness and weakness   Immunizations Administered    Name Date Dose VIS Date Route   Pfizer COVID-19 Vaccine 02/07/2020  4:53 PM 0.3 mL 12/01/2018 Intramuscular   Manufacturer: [employer], [employer]   Lot: [id number]   NDC: [address]-7

## 2020-02-13 ENCOUNTER — Telehealth: Payer: Self-pay | Admitting: Obstetrics and Gynecology

## 2020-02-13 NOTE — Telephone Encounter (Signed)
Please contact patient to schedule a lab visit for a fasting cholesterol profile and CMP.  Her cholesterol was elevated and so was her alkaline phosphatase.   If she wishes to do this with her PCP, we will need to send a copy of these labs to her PCP so they know what she is follow up for.

## 2020-02-15 NOTE — Telephone Encounter (Signed)
Spoke with patients daughter, Cristela Blue, ok per dpr. Advised as seen below per Dr. Edward Jolly. Mellisa states her mother is still experiencing LLQ pain, keeps her up at night. Denies any other symptoms. Patient did see Orthopedic in March 2020, daughter is unsure of results or f/u, she did not go to this appt with her. She will discuss with her mother and return call to office to advise how she would like to proceed.

## 2020-02-23 NOTE — Telephone Encounter (Signed)
Call to patients daughter [employer]. Left message to call Noreene Larsson, RN at [facility] [phone].

## 2020-02-29 NOTE — Telephone Encounter (Signed)
I referred the patient to an orthopedic doctor this year, and I do not see any consultation notes from Dr. Sharolyn Douglas.  It would be helpful for me to receive this note if patient went to this consultation.   If she would like a referral to the [facility] for chronic pain, I am happy to make this referral.

## 2020-02-29 NOTE — Telephone Encounter (Signed)
Call placed to [facility], Dr. Noel Gerold Left message requesting copy of OV notes faxed to [phone] Return call to [facility], RN at [facility] if any additional questions.

## 2020-02-29 NOTE — Telephone Encounter (Signed)
Daughter previously notified of results and recommendations. No return call from patient or daughter.  Dr. Edward Jolly -please advise how you would like to proceed.

## 2020-03-01 NOTE — Telephone Encounter (Signed)
I received a copy of the visit from [facility] done 12/10/19.  Patient has herniation of her lower spinal discs affecting nerves on her left side.  This can cause back and leg pain and theoretically affect the left lower abdomen, which is an area of chronic pain for her.   I recommend she contact these specialists back to discuss her ongoing pain keeping her awake, as I think this may be coming from her spine.  They may need to reassess her care.  I see she had an injection in the past for her pain.   I am happy to see her back, but I think the pain is less likely gynecologic.

## 2020-03-01 NOTE — Telephone Encounter (Signed)
Call to patients daughter, Cristela Blue, ok per dpr. Left message to call Noreene Larsson, RN at [facility] [phone].

## 2020-03-01 NOTE — Telephone Encounter (Signed)
OV note dated 12/09/19 received from [facility] To Dr. Edward Jolly for review.

## 2020-03-09 NOTE — Telephone Encounter (Signed)
Encounter reviewed and closed.  

## 2020-03-09 NOTE — Telephone Encounter (Signed)
No return call from patient or her daughter. Patients daughter, Cristela Blue, previously notified of results and recommendations.    Dr. Edward Jolly -ok to close encounter?

## 2020-03-22 ENCOUNTER — Telehealth: Payer: Self-pay | Admitting: Family Medicine

## 2020-03-22 NOTE — Telephone Encounter (Signed)
[name] is returning call about her Maria Garrison's back results.  Maria Garrison is patient.

## 2020-03-23 NOTE — Telephone Encounter (Signed)
Left a message on pt daughter voicemail box advised to call the office back regarding question about pt labs

## 2020-03-23 NOTE — Telephone Encounter (Signed)
Dr. Banks patient  

## 2020-04-05 NOTE — Telephone Encounter (Signed)
Attempted to call pt daughter regarding pt lab results left a message to call the office back

## 2020-06-22 IMAGING — MG DIGITAL DIAGNOSTIC BILAT W/ TOMO W/ CAD
6 of 12 series · 6 of 36 positions shown · non-contrast
Comparison: 08/05/2017

CLINICAL DATA: Patient has focal pain in the 7 o'clock location
RIGHT breast 1 year. Patient has a lump in the 4 o'clock location of
the LEFT breast. She reports diffuse heaviness in the LOWER portion
of the LEFT breast.

EXAM:
DIGITAL DIAGNOSTIC BILATERAL MAMMOGRAM WITH CAD AND TOMO
ULTRASOUND BILATERAL BREAST

[R TAN synth-2D]
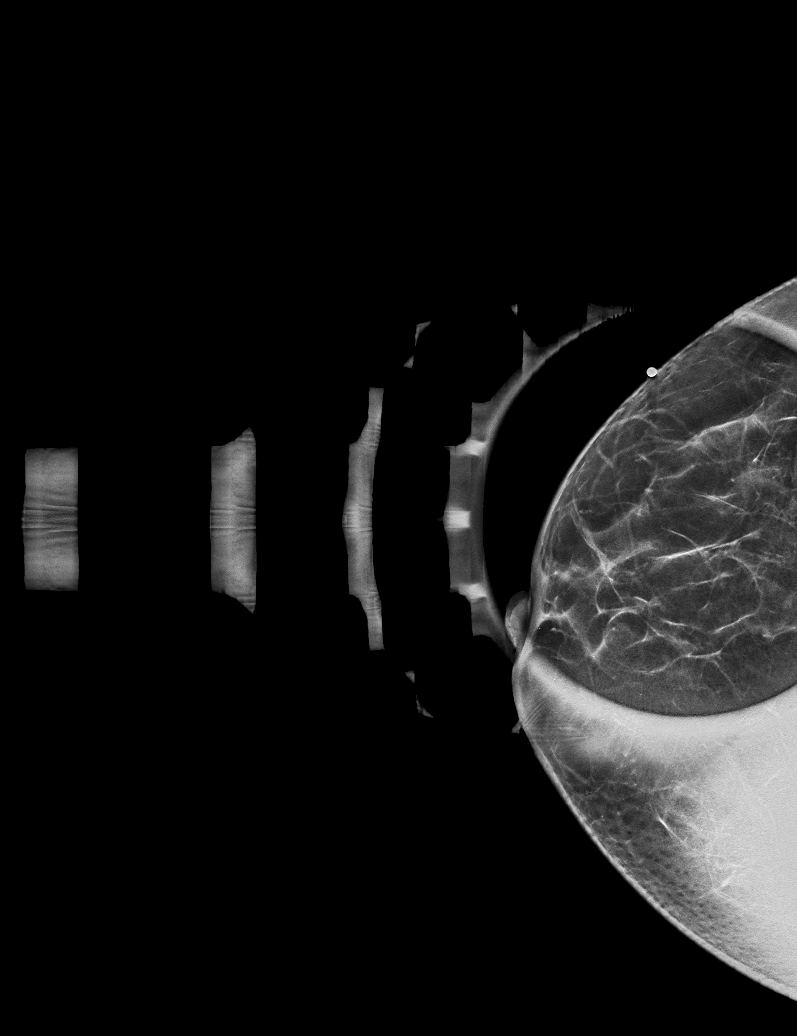

[R CC synth-2D]
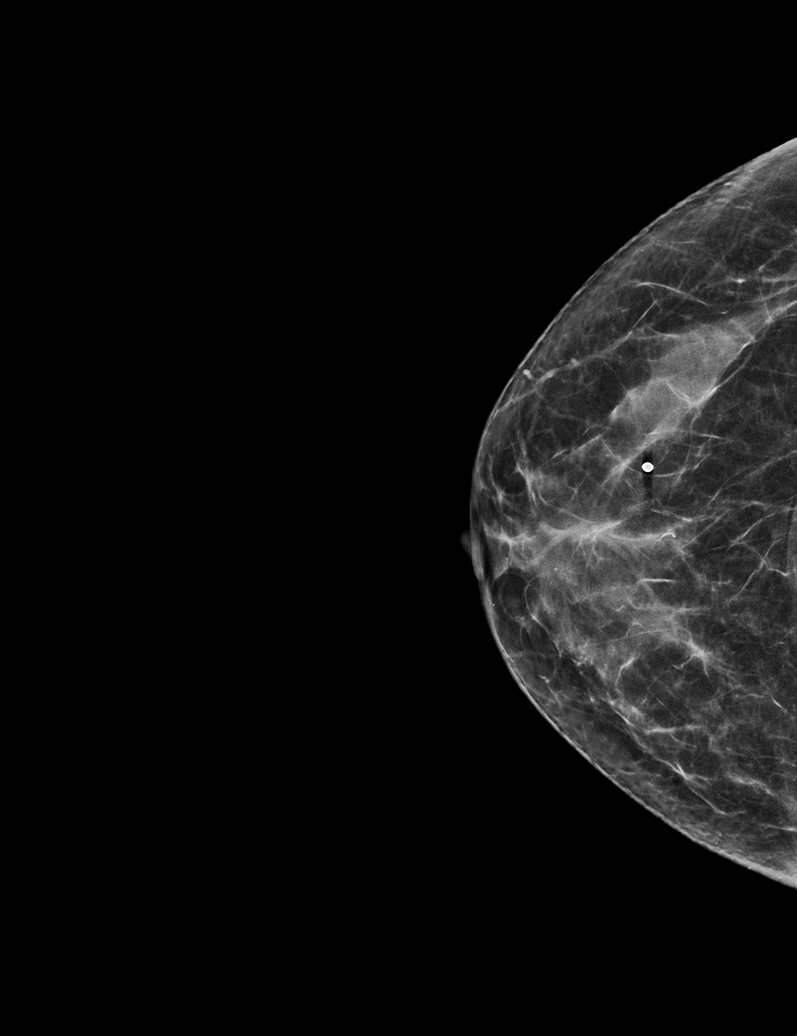

[L MLO synth-2D]
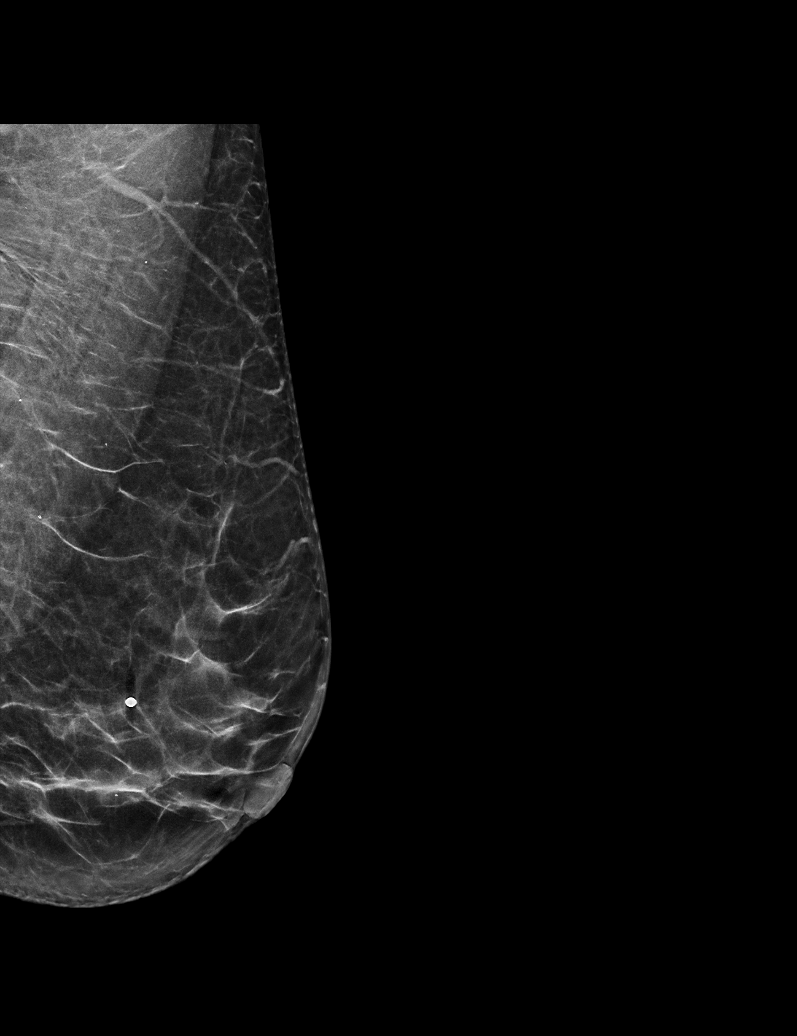

[L TAN synth-2D]
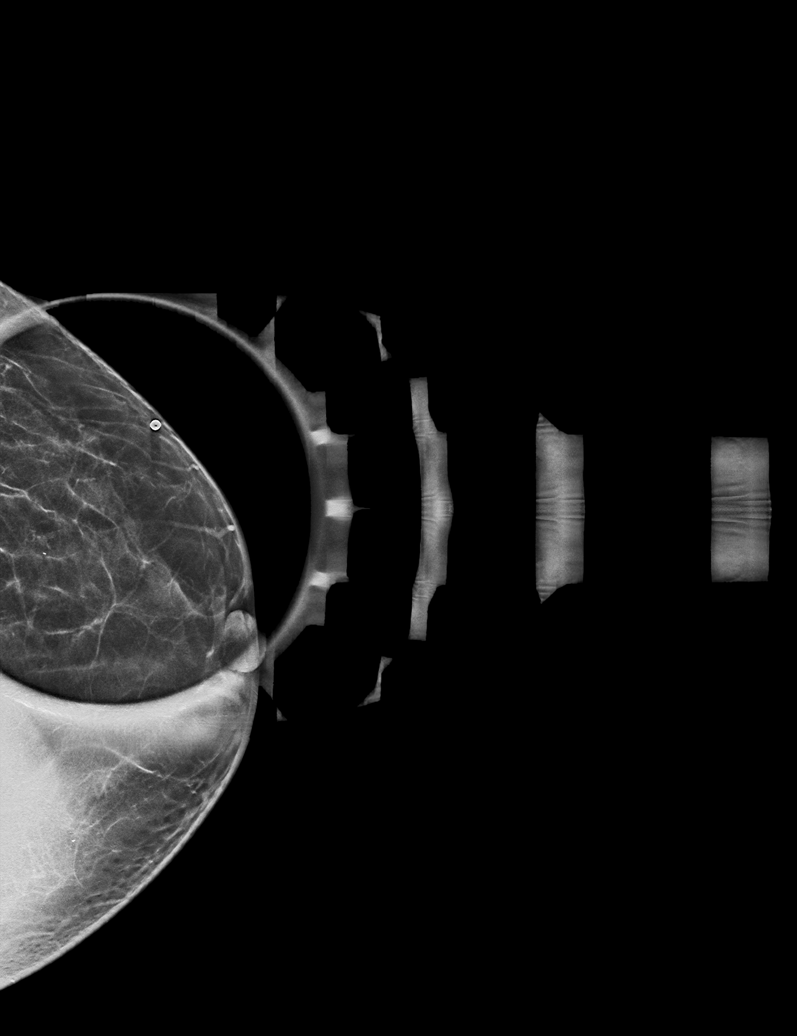

[L CC synth-2D]
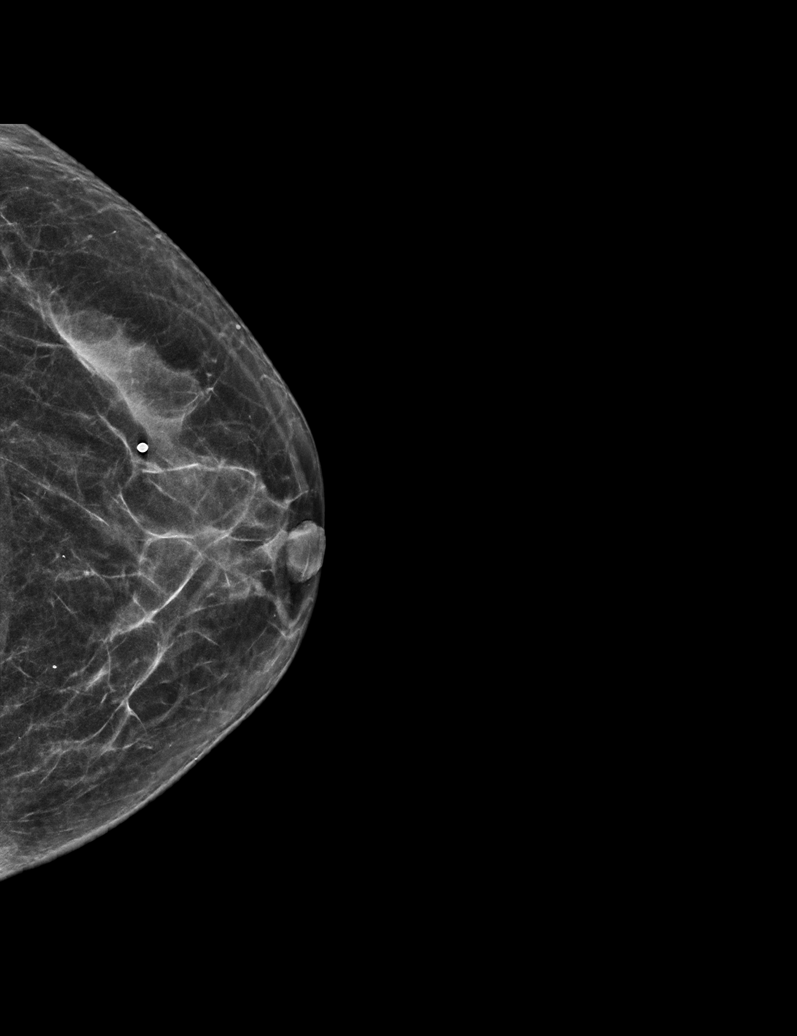

[R MLO synth-2D]
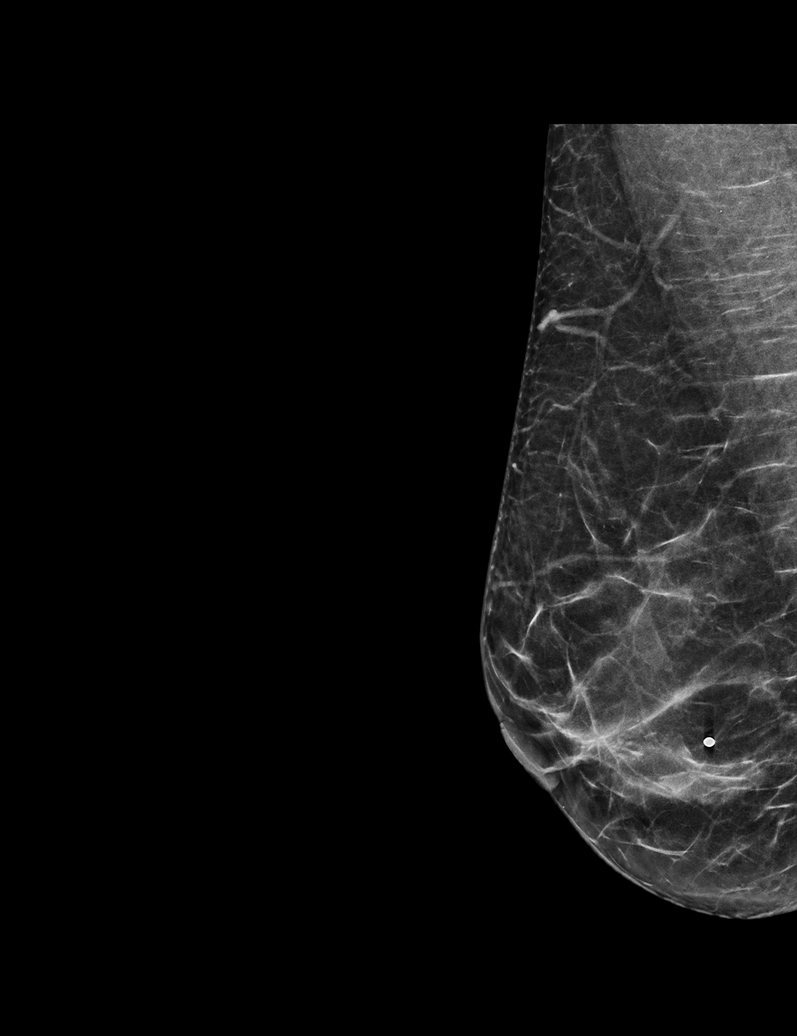

[6 of 36 positions shown; findings below may reference images not displayed]

ACR Breast Density Category c: The breast tissue is heterogeneously
dense, which may obscure small masses.
FINDINGS: Right breast:

Mammogram: No suspicious mass, distortion, or microcalcifications
are identified to suggest presence of malignancy. Spot tangential
view of focal pain in the LOWER OUTER QUADRANT of the RIGHT breast
is unremarkable. Mammographic images were processed with CAD.

Ultrasound: Targeted ultrasound is performed, showing normal
appearing fibroglandular tissue in the LOWER OUTER QUADRANT of the
RIGHT breast. No suspicious mass, distortion, or acoustic shadowing
is demonstrated with ultrasound.

Left breast:

Mammogram: No suspicious mass, distortion, or microcalcifications
are identified to suggest presence of malignancy. Spot tangential
view in the area possible mass in the LOWER OUTER QUADRANT shows
normal appearing fibroglandular tissue. Mammographic images were
processed with CAD.

Ultrasound: Targeted ultrasound is performed, showing normal
appearing fibroglandular tissue in the LOWER OUTER QUADRANT and
LOWER portions of the LEFT breast. No suspicious mass, distortion,
or acoustic shadowing is demonstrated with ultrasound.
IMPRESSION: No mammographic or ultrasound evidence for malignancy.

RECOMMENDATION:
Screening mammogram in one year.(Code:XL-5-EKT)

I have discussed the findings and recommendations with the patient
with the assistance of an interpreter. If applicable, a reminder
letter will be sent to the patient regarding the next appointment.

BI-RADS CATEGORY  1: Negative.

## 2020-07-17 ENCOUNTER — Ambulatory Visit: Payer: BC Managed Care – PPO | Admitting: Family Medicine

## 2020-07-19 ENCOUNTER — Other Ambulatory Visit: Payer: Self-pay

## 2020-07-19 ENCOUNTER — Ambulatory Visit: Payer: Managed Care, Other (non HMO) | Admitting: Family Medicine

## 2020-07-19 ENCOUNTER — Encounter: Payer: Self-pay | Admitting: Family Medicine

## 2020-07-19 VITALS — BP 118/76 | HR 74 | Temp 98.5°F | Ht 60.0 in | Wt 124.2 lb

## 2020-07-19 DIAGNOSIS — R1084 Generalized abdominal pain: Secondary | ICD-10-CM

## 2020-07-19 DIAGNOSIS — R350 Frequency of micturition: Secondary | ICD-10-CM | POA: Diagnosis not present

## 2020-07-19 DIAGNOSIS — K5900 Constipation, unspecified: Secondary | ICD-10-CM

## 2020-07-19 DIAGNOSIS — G8929 Other chronic pain: Secondary | ICD-10-CM | POA: Insufficient documentation

## 2020-07-19 DIAGNOSIS — K219 Gastro-esophageal reflux disease without esophagitis: Secondary | ICD-10-CM

## 2020-07-19 DIAGNOSIS — Z0000 Encounter for general adult medical examination without abnormal findings: Secondary | ICD-10-CM

## 2020-07-19 DIAGNOSIS — R3129 Other microscopic hematuria: Secondary | ICD-10-CM | POA: Insufficient documentation

## 2020-07-19 LAB — POCT URINALYSIS DIPSTICK
Bilirubin, UA: NEGATIVE
Glucose, UA: NEGATIVE
Ketones, UA: NEGATIVE
Leukocytes, UA: NEGATIVE
Nitrite, UA: NEGATIVE
Protein, UA: NEGATIVE
Spec Grav, UA: 1.015 (ref 1.010–1.025)
Urobilinogen, UA: 0.2 E.U./dL
pH, UA: 5 (ref 5.0–8.0)

## 2020-07-19 MED ORDER — POLYETHYLENE GLYCOL 3350 17 GM/SCOOP PO POWD: 17.0000 g | Freq: Every day | ORAL | 1 refills | Status: DC | PRN

## 2020-07-19 MED ORDER — PANTOPRAZOLE SODIUM 40 MG PO TBEC: 40.0000 mg | DELAYED_RELEASE_TABLET | Freq: Every day | ORAL | 1 refills | Status: DC

## 2020-07-19 NOTE — Patient Instructions (Addendum)
Ch?m [place] phng ng?a 40-64 tu?i, N? gi?i Preventive Care 63-52 Years Old, Female Ch?m [place] phng ng?a ?? c?p ??n cc l?n khm v?i chuyn gia ch?m [place] s?c kh?e v cc l?a ch?n l?i s?ng c th? t?ng c??ng s?c kh?e v h?nh phc. Nh?ng vi?c ny bao g?m:  Khm th?c th? hng n?m. Vi?c ny c?ng c th? ???c g?i l ki?m tra s?c kh?e hng n?m.  Khm nha khoa v khm m?t th??ng xuyn.  Ch?ng ng?a.  Sng l?c m?t s? tnh tr?ng nh?t ??nh.  Cc l?a ch?n l?i s?ng lnh m?nh, ch?ng h?n nh? ?n ch? ?? ?n t?t cho s?c kh?e, t?p th? d?c ??u ??n, khng s? d?ng ma ty v cc s?n ph?m c nicotine v thu?c l v h?n ch? u?ng r??u bia. Ti c th? d? ki?n nh?ng g ??i v?i l?n khm ch?m [place] phng ng?a c?a ti? Khm th?c th? Chuyn gia ch?m [place] s?c kh?e s? ki?m tra qu v? v?:  Chi?u cao v cn n?ng. Nh?ng ch? s? ny c th? ???c s? d?ng ?? tnh ton ch? s? kh?i c? th? (BMI), cho bi?t qu v? c cn n?ng kh?e m?nh hay khng.  Nh?p tim v huy?t p.  Da xem c cc ??m b?t th??ng hay khng. T? v?n Chuyn gia ch?m [place] s?c kh?e c th? h?i qu v? v? vi?c:  S? d?ng r??u, thu?c l v ma ty.  Tnh tr?ng s?c kh?e tinh th?n.  Tnh tr?ng h?nh phc ? nh v trong m?i quan h?.  Quan h? tnh d?c.  Thi quen ?n u?ng.  Cng vi?c v mi tr??ng lm vi?c.  Ph??ng php ng?a [place].  Chu k? kinh nguy?t.  Ti?n s? mang thai. Ti c?n ch?ng ng?a nh?ng g?  V?c xin cm.  V?c xin ny ???c khuy?n ngh? hng n?m. V??c xin u?n vn, b?ch h?u v ho g (Tdap)  Qu v? c th? c?n m?t li?u Td (b?ch h?u, u?n vn) nh?c l?i 10 n?m m?t l?n. V?c xin varicella (th?y ??u)  Qu v? c th? c?n v?c xin ny n?u ch?a ???c chch ng?a. V?c xin Zona  Qu v? c th? c?n v?c xin ny sau tu?i 60. V?c xin s?i, quai b? v rubella (MMR)  Qu v? c th? c?n t nh?t m?t li?u MMR n?u sinh ra vo n?m 1957 ho?c sau ?Sander Nephew v? c?ng c?n li?u th? hai. V?c xin lin h?p ph? c?u khu?n (PCV13)  Qu v? c th? c?n v?c xin ny n?u c m?t s? tnh tr?ng b?nh l nh?t ??nh  v tr??c ?y ch?a ???c chch ng?a. V?c xin ph? c?u khu?n polysaccharide (PPSV23)  Qu v? c th? c?n m?t ho?c hai li?u n?u qu v? ht thu?c ho?c n?u qu v? c m?t s? tnh tr?ng b?nh l nh?t ??nh. V?c xin lin h?p vim mng no (MenACWY)  Qu v? c th? c?n v?c xin ny n?u c cc tnh tr?ng nh?t ??nh. V?c xin vim gan A  Qu v? c th? c?n v?c xin ny n?u c m?t s? tnh tr?ng nh?t ??nh ho?c n?u qu v? ?i du l?ch ho?c lm vi?c ? nh?ng n?i m qu v? c th? b? ph?i nhi?m vim gan A. V?c xin vim gan B  Qu v? c th? c?n v?c xin ny n?u c m?t s? tnh tr?ng b?nh l nh?t ??nh ho?c n?u qu v? ?i du l?ch ho?c lm vi?c ? nh?ng n?i m qu v? c th? b? ph?i nhi?m vim gan B.  V?c xin Haemophilus influenzae tup b (Hib)  Qu v? c th? c?n v?c xin ny n?u c cc tnh tr?ng nh?t ??nh. V?c xin Human papillomavirus (HPV)  N?u ???c chuyn gia ch?m [place] s?c kh?e khuy?n ngh?, qu v? c th? c?n ba li?u trong 6 thng. Qu v? c th? ???c tim v?cxin d??i d?ng cc li?u ring l? ho?c nhi?u h?n m?t v?cxin ???c tim cng nhau trong m?t l?n tim (v?cxin k?t h?p). Hy trao ??i v?i chuyn gia ch?m [place] s?c kh?e c?a qu v? v? cc nguy c? v l?i ch c?a v?cxin k?t h?p. Ti c?n lm cc xt nghi?m no? Xt nghi?m mu  N?ng ?? lipid v cholesterol. L??ng cc ch?t ny c th? ???c ki?m tra 5 n?m m?t l?n ho?c th??ng xuyn h?n n?u qu v? trn 50 tu?i.  Xt nghi?m vim gan C.  Xt nghi?m vim gan B. Sng l?c  Sng l?c ung th? ph?i. Qu v? c th? ???c lm sng l?c ny m?i n?m m?t l?n b?t ??u ? tu?i 55 n?u qu v? c ti?n s? ht 30 bao thu?c m?i n?m v hi?n nay c ht thu?c ho?c ? b? thu?c trong vng 15 n?m tr??c.  Sng l?c ung th? ??i tr?c trng. T?t c? ng??i l?n nn sng l?c b?t ??u ? tu?i 50 v ti?p t?c cho ??n tu?i 75. Chuyn gia ch?m [place] s?c kh?e c?a qu v? c th? khuy?n ngh? sng l?c ? tu?i 47 n?u qu v? b? t?ng nguy c?. Qu v? s? ???c lm cc ki?m tra 1-10 n?m m?t l?n, ty thu?c vo k?t qu? v lo?i ki?m tra sng l?c c?a  qu v?.  Sng l?c ti?u ???ng. Xt nghi?m ny ???c th?c hi?n b?ng cch ki?m tra ???ng huy?t (glucose) sau khi qu v? khng ?n g trong m?t kho?ng th?i gian (nh?n ?i). Qu v? c th? ???c lm xt nghi?m ny 1-3 n?m m?t l?n.  Ch?p x-quang tuy?n v. Ki?m tra ny c th? ???c th?c hi?n 1-2 n?m m?t l?n. Hy trao ??i v?i chuyn gia ch?m [place] s?c kh?e v? vi?c khi no qu v? nn b?t ??u ch?p x-quang tuy?n v hng n?m. ?i?u ny c th? ph? thu?c vo vi?c qu v? c ti?n s? gia ?nh b? ung th? v hay khng.  Sng l?c ung th? lin quan ??n BRCA. Xt nghi?m ny c th? ???c th?c hi?n n?u qu v? c ti?n s? gia ?nh b? ung th? v, bu?ng tr?ng, ?ng d?n tr?ng ho?c ung th? phc m?c.  Khm vng ch?u v xt nghi?m ph?t t? bo c? t? cung (Pap). Nh?ng xt nghi?m ny c th? ???c th?c hi?n 3 n?m m?t l?n b?t ??u t? tu?i 21. B?t ??u ? tu?i 30, xt nghi?m ny c th? ???c th?c hi?n 5 n?m m?t l?n n?u qu v? ???c lm xt nghi?m Pap k?t h?p v?i xt nghi?m HPV. Cc xt nghi?m khc  Ki?m tra b?nh ly truy?n qua ???ng tnh d?c (STD).  Ch?p m?t ?? x??ng. Ki?m tra ny ???c th?c hi?n ?? sng l?c b?nh long x??ng. Qu v? c th? ???c ch?p m?t ?? x??ng n?u c nguy c? cao b? long x??ng. Tun th? nh?ng h??ng d?n ny ? nh: ?n v u?ng  ?n ch? ?? ?n bao g?m tri cy v rau c? t??i, ng? c?c nguyn h?t, protein t? th?t n?c v s?a t bo.  Dng th?c ph?m b? sung vitamin v ch?t khong theo khuy?n ngh? c?a chuyn gia ch?m [place] s?c kh?e.  Khng u?ng r??u n?u: ? Chuyn gia ch?m [place] s?c kh?e c?a qu v? khuyn qu v? khng u?ng r??u. ? Qu v? c [place], c th? c [place], ho?c c k? ho?ch c [place].  N?u qu v? u?ng r??u: ? Gi?i h?n l??ng r??u qu v? u?ng ? 0-1 ly m?i ngy. ? Bi?t r m?t ly c bao nhiu r??u. ? M?, m?t ly t??ng ???ng v?i m?t chai bia 12 ao x? (355 mL), m?t ly r??u vang 5 ao x? (148 mL), ho?c m?t ly r??u m?nh 1 ao x? (44 mL). L?i s?ng  Ch?m [place] r?ng v l?i hng ngy.  N?ng v?n ??ng. T?p th? d?c t nh?t 30 pht t? 5 ngy tr? ln  m?i tu?n.  Khng s? d?ng b?t k? s?n ph?m no c nicotine ho?c thu?c l, ch?ng ha?n nh? thu?c l d?ng ht, thu?c l ?i?n t? v thu?c l d?ng nhai. N?u qu v? c?n gip ?? ?? cai thu?c, hy h?i chuyn gia ch?m [place] s?c kh?e.  N?u qu v? c quan h? tnh d?c, hy th?c hnh quan h? tnh d?c an ton. Dng bao cao su ho?c hnh th?c ki?m sot sinh s?n khc (trnh [place]) ?? ng?n ng?a mang thai v STI (b?nh ly truy?n qua ???ng tnh d?c).  N?u chuyn gia ch?m [place] s?c kh?e ch? d?n, hy dng aspirin li?u th?p hng ngy b?t ??u t? 50 tu?i. C?n lm g ti?p theo?  ??n khm v?i chuyn gia ch?m [place] s?c kh?e m?t l?n m?i n?m ?? ki?m tra s?c kh?e.  Hy h?i chuyn gia ch?m [place] s?c kh?e v? vi?c bao lu th qu v? nn khm m?t v r?ng m?t l?n.  Tim v?c xin ??y ??Tera Mater tin ny khng nh?m m?c ?ch thay th? cho l?i khuyn m chuyn gia ch?m [place] s?c kh?e ni v?i qu v?. Hy b?o ??m qu v? ph?i th?o lu?n b?t k? v?n ?? g m qu v? c v?i chuyn gia ch?m [place] s?c kh?e c?a qu v?. Document Revised: 07/03/2018 Document Reviewed: 07/03/2018 Elsevier Patient Education  [place].  To bn, Ng??i l?n Constipation, Adult To bn l khi m?t ng??i ?i ??i ti?n trong m?t tu?n t h?n so v?i bnh th??ng, ??i ti?n kh kh?n, ho?c ??i ti?n ra phn kh, c?ng, ho?c to h?n bnh th??ng. To bn c th? do m?t tnh tr?ng bn trong gy ra. N c th? tr? ln tr?m tr?ng h?n theo ?? tu?i n?u m?t ng??i dng cc lo?i thu?c nh?t ??nh v khng u?ng ?? n??c. Tun th? nh?ng h??ng d?n ny ? nh: ?n v u?ng   ?n th?c ?n c nhi?u ch?t x? nh? tri cy t??i v rau, ng? c?c nguyn h?t v cc lo?i ??u.  H?n ch? th?c ?n c nhi?u ch?t bo, t ch?t x?, ho?c ch? bi?n s?n qu m?c, ch?ng h?n khoai ty chin, bnh hamburger, bnh quy, k?o v soda.  U?ng ?? n??c ?? gi? cho n??c ti?u trong ho?c c mu vng nh?t. H??ng d?n chung  T?p th? d?c th??ng xuyn theo ch? d?n c?a chuyn gia ch?m [place] s?c kh?e.  ?i v? sinh ngay khi qu v? c nhu c?u. Khng nh?n  ?i ??i ti?n.  Ch? s? d?ng thu?c khng k ??n v thu?c k ??n theo ch? d?n c?a chuyn gia ch?m  s?c kh?e. Cc thu?c ny bao g?m b?t k? th?c ph?m ch?c n?ng c ch?t x? no.  Th?c hnh cc bi t?p luy?n l?i c? ?y ch?u, ch?ng h?n  nh? th? su trong lc th? gin b?ng d??i v th? gin ph?n ?y ch?u trong khi ??i ti?n.  Theo di tnh tr?ng c?a qu v? ?? pht hi?n b?t k? thay ??i no.  Tun th? t?t c? cc l?n khm theo di theo ch? d?n c?a chuyn gia ch?m [place] s?c kh?e. ?i?u ny c vai tr quan tr?ng. Hy lin l?c v?i chuyn gia ch?m [place] s?c kh?e n?u:  Qu v? b? ?au tr?m tr?ng h?n.  Qu v? b? s?t.  Qu v? khng ?i ??i ti?n sau 4 ngy.  Qu v? nn.  Qu v? khng ?i.  Qu v? b? s?t cn.  Qu v? b? ch?y mu ? h?u mn.  Phn c?a qu v? m?ng, trng nh? bt ch. Yu c?u tr? gip ngay l?p t?c n?u:  Qu v? b? s?t v cc tri?u ch?ng c?a qu v? ??t nhin tr?m tr?ng h?n.  Qu v? sn phn ho?c c mu trong phn.  B?ng c?a qu v? b? ch??ng ln.  Qu v? b? ?au r?t nhi?u ? b?ng.  Qu v? c?m th?y chng m?t ho?c ng?t x?u. Thng tin ny khng nh?m m?c ?ch thay th? cho l?i khuyn m chuyn gia ch?m [place] s?c kh?e ni v?i qu v?. Hy b?o ??m qu v? ph?i th?o lu?n b?t k? v?n ?? g m qu v? c v?i chuyn gia ch?m [place] s?c kh?e c?a qu v?. Document Revised: 01/06/2017 Document Reviewed: 03/13/2016 Elsevier Patient Education  [place].  B?nh tro ng??c d? dy th?c qu?n, Ng??i l?n Gastroesophageal Reflux Disease, Adult Tro ng??c d? dy th?c qu?n (GER) x?y ra khi axit t? d? dy tro ln ?ng k?t n?i mi?ng v d? dy (th?c qu?n). Thng th??ng, th?c ?n di chuy?n xu?ng th?c qu?n v ? trong d? dy ?? tiu ha. Tuy nhin, khi m?t ng??i b? GER, th?c ?n v axit trong d? dy ?i khi tro ng??c tr? [place] th?c qu?n. N?u tnh tr?ng ny tr? thnh m?t v?n ?? nghim tr?ng h?n, ng??i ? c th? ???c ch?n ?on b? b?nh g?i l b?nh tro ng??c d? dy th?c qu?n (GERD). GERD x?y ra khi tro ng??c:  Xu?t hi?n th??ng  xuyn.  Gy ra cc tri?u ch?ng th??ng xuyn ho?c n?ng.  Gy ra cc v?n ?? nh? th??ng t?n th?c qu?n. Khi axit d? dy ti?p xc v?i th?c qu?n, axit c th? gy lot (vim) trong th?c qu?n. Theo th?i gian, GERD c th? t?o ra cc l? nh? (v?t lot) ? nim m?c th?c qu?n. Nguyn nhn g gy ra? Tnh tr?ng ny do m?t v?n ?? c?a ph?n c? gi?a th?c qu?n v d? dy (c? th?t th?c qu?n d??i, hay l lower esophageal sphincter, LES) gy ra. Thng th??ng c? LES ?ng l?i sau khi th?c ?n ?i qua th?c qu?n vo d? dy. Khi LES b? y?u ho?c b?t th??ng, c? khng ?ng theo ?ng cch v ?i?u ? khi?n cho th?c ?n v a xt d? dy tro ng??c tr? l?i th?c qu?n. LES c th? b? y?u do m?t s? ch?t ?n [place] nh?t ??nh, thu?c v cc tnh tr?ng b?nh l, bao g?m:  S? d?ng thu?c l.  Mang thai.  Thot v? honh.  S? d?ng r??u.  M?t s? lo?i th?c ?n v ?? u?ng nh?t ??nh, nh? c ph, s c la, hnh v b?c h. ?i?u g lm t?ng nguy c?? Tnh tr?ng ny d? x?y ra h?n n?u qu v?:  B? t?ng cn.  B? r?i lo?n m lin  k?t.  S? d?ng thu?c ch?ng vim khng steroid (NSAID). Cc d?u hi?u ho?c tri?u ch?ng l g? Nh?ng tri?u ch?ng c?a tnh tr?ng ny bao g?m:  ? nng.  Kh nu?t ho?c ?au khi nu?t.  C?m th?y nh? c m?t c?c trong h?ng.  C?m gic ??ng trong mi?ng.  H?i th? hi.  C nhi?u n??c b?t.  C?m gic kh ch?u trong b?ng ho?c ch??ng b?ng.  ? h?i.  ?au ng?c. Nh?ng tnh tr?ng khc nhau c th? gy ?au ng?c. B?o ??m qu v? ?i khm chuyn gia ch?m [place] s?c kh?e n?u qu v? b? ?au ng?c.  Kh th? ho?c th? kh kh.  Ho lin t?c (m?n tnh) ho?c ho vo ban ?m.  B? h?ng l?p men r?ng.  S?t cn. Ch?n ?on tnh tr?ng ny nh? th? no? Chuyn gia ch?m [place] s?c kh?e c?a qu v? s? h?i v? b?nh s? v khm th?c th? cho qu v?. ?? xc ??nh qu v? b? GERD nh? hay n?ng, chuyn gia ch?m [place] s?c kh?e c?ng c th? theo di qu v? ?p ?ng v?i vi?c ?i?u tr? nh? th? no. Qu v? c?ng c th? ???c lm cc ki?m tra, bao g?m:  M?t ki?m tra ?? khm d? dy  v th?c qu?n b?ng m?t camera nh? (n?i soi).  Ki?m tra ?o n?ng ?? a xt trong th?c qu?n c?a qu v?.  Ki?m tra ?o m?c p l?c ln th?c qu?n c?a qu v?.  Ki?m tra nu?t bari ho?c nu?t bari bi?n tnh ?? cho th?y hnh d?ng, kch th??c v ch?c n?ng c?a th?c qu?n c?a qu v?. Tnh tr?ng ny ???c ?i?u tr? nh? th? no? M?c tiu c?a ?i?u tr? l gip lm gi?m cc tri?u ch?ng v ng?n ng?a bi?n ch?ng. Vi?c ?i?u tr? b?nh ny c th? khc nhau ty thu?c m?c ?? n?ng c?a tri?u ch?ng. Chuyn gia ch?m [place] s?c kh?e c?a qu v? c th? khuy?n ngh?:  Thay ??i ch? ?? ?n.  Thu?c.  Ph?u thu?t. Tun th? nh?ng h??ng d?n ny ? nh: ?n v u?ng   Tun th? m?t ch? ?? ?n theo khuy?n ngh? c?a chuyn gia ch?m [place] s?c kh?e. Vi?c ny c th? l trnh cc th?c ?n v ?? u?ng nh?: ? C ph v tr (c ho?c khng c caffeine). ? ?? u?ng cr??u. ? ?? u?ng t?ng l?c v ?? u?ng dng trong th? thao. ? ?? u?ng c ga ho?c soda. ? S-c-la v cacao. ? B?c h v h??ng b?c h. ? T?i v hnh. ? C?i ng?a (Horseradish). ? Cc th?c ?n nhi?u gia v? v axit, bao g?m h?t tiu, b?t ?t, b?t cri, gi?m, n??c s?t cay v n??c s?t th?t n??ng. ? N??c qu? ho?c qu? h? cam qut, ch?ng h?n nh? cam, chanh v chanh l cam. ? Cc th?c ?n c c chua, nh? n??c x?t ??, ?t, n??c x?t salsa v pizza km x?t ??. ? Th?c ?n chin v nhi?u ch?t bo, ch?ng h?n nh? bnh rn, khoai ty chin, khoai ty rn v n??c x?t nhi?u ch?t bo. ? Th?t nhi?u ch?t bo, ch?ng h?n nh? hot dog (bnh m k?p xc xch) v cc lo?i th?t ?? v tr?ng nhi?u m?, ch?ng h?n nh? th?t n?c l?ng, xc xch, gi?m bng v th?t l?n xng khi. ? Nh?ng s?n ph?m b? s?a giu ch?t bo, nh? s?a nguyn kem, b? v pho mt kem.  ?n cc b?a nh?, th??ng xuyn thay v cc b?a no.  Trnh u?ng nhi?u n??c khi ?  n.  Hessie Diener ?n trong kho?ng 2-3 gi? tr??c khi ?i ng?.  Trnh n?m xu?ng ngay sau khi ?n.  Khng t?p th? d?c ngay sau khi ?n. L?i s?ng   Khng s? d?ng b?t k? s?n ph?m no c nicotine ho?c thu?c l, ch?ng  ha?n nh? thu?c l d?ng ht, thu?c l ?i?n t? v thu?c l d?ng nhai. N?u qu v? c?n gip ?? ?? cai thu?c, hy h?i chuyn gia ch?m [place] s?c kh?e.  C? g?ng gi?m c?ng th?ng b?ng cch s? d?ng cc ph??ng php nh? yoga ho?c thi?n. N?u qu v? c?n gip ?? ?? lm gi?m c?ng th?ng, hy h?i chuyn gia ch?m [place] s?c kh?e.  N?u qu v? th?a cn, hy gi?m cn v? m?c c l?i cho s?c kh?e c?a qu v?. Hy h?i chuyn gia ch?m [place] s?c kh?e ?? ???c h??ng d?n v? m?c tiu gi?m cn an ton. H??ng d?n chung  Ch  ??n b?t c? thay ??i no v? tri?u ch?ng c?a qu v?.  Ch? s? d?ng thu?c khng k ??n v thu?c k ??n theo ch? d?n c?a chuyn gia ch?m [place] s?c kh?e. Khng dng aspirin, ibuprofen, ho?c cc thu?c ch?ng vim khng steroid (NSAID) khc tr? khi chuyn gia ch?m [place] s?c kh?e c?a qu v? cho php.  M?c qu?n o r?ng. Khng m?c ci g ch?t quanh eo m c th? t?o p l?c ln b?ng.  Nng (nng cao) ??u gi??ng c?a qu v? thm 6 ins? (15 cm).  Trnh g?p ng??i n?u ?i?u ny khi?n cc tri?u ch?ng tr? nn tr?m tr?ng h?n.  Tun th? t?t c? cc l?n khm theo di theo ch? d?n c?a chuyn gia ch?m [place] s?c kh?e. ?i?u ny c vai tr quan tr?ng. Hy lin l?c v?i chuyn gia ch?m [place] s?c kh?e n?u:  Qu v? bi?: ? Cc tri?u ch?ng m?i. ? S?t cn khng r nguyn nhn. ? Kh nu?t ho?c b? ?au khi nu?t. ? Th? kh kh ho?c ho dai d?ng. ? Gi?ng khn.  Cc tri?u ch?ng c?a qu v? khng c?i thi?n sau khi ???c ?i?u tr?Tasia Catchings c?u tr? gip ngay l?p t?c n?u qu v?:  B? ?au ? cnh tay, c?, hm, r?ng ho?c l?ng.  C?m th?y ?? m? hi, chng m?t ho?c chong vng.  B? ?au ng?c ho?c kh th?.  Nn v ch?t nn ra gi?ng nh? mu ho?c b c ph.  Ng?t x?u.  C phn l?n mu ho?c mu ?en.  Khng th? nu?t, u?ng ho?c ?n. Tm t?t  Tro ng??c d? dy th?c qu?n x?y ra khi axit t? d? dy tro ln th?c qu?n. GERD l b?nh trong ? tro ng??c x?y ra th??ng xuyn, gy ra cc tri?u ch?ng n?ng ho?c th??ng xuyn, ho?c gy ra cc v?n ?? nh? t?n th??ng th?c qu?n.  Vi?c  ?i?u tr? b?nh ny c th? khc nhau ty thu?c m?c ?? n?ng c?a tri?u ch?ng. Chuyn gia ch?m [place] s?c kh?e c th? khuy?n ngh? thay ??i ch? ?? ?n u?ng v l?i s?ng, dng thu?c ho?c ph?u thu?t.  Hy lin h? v?i chuyn gia ch?m [place] s?c kh?e n?u qu v? c tri?u ch?ng m?i ho?c tri?u ch?ng ? nn tr?m tr?ng h?n.  Ch? s? d?ng thu?c khng k ??n v thu?c k ??n theo ch? d?n c?a chuyn gia ch?m [place] s?c kh?e. Khng dng aspirin, ibuprofen, ho?c cc thu?c ch?ng vim khng steroid (NSAID) khc tr? khi chuyn gia ch?m [place] s?c kh?e c?a qu v? cho php.  Tun th? t?t  c? cc l?n khm theo di theo ch? d?n c?a chuyn gia ch?m [place] s?c kh?e. ?i?u ny c vai tr quan tr?ng. Thng tin ny khng nh?m m?c ?ch thay th? cho l?i khuyn m chuyn gia ch?m [place] s?c kh?e ni v?i qu v?. Hy b?o ??m qu v? ph?i th?o lu?n b?t k? v?n ?? g m qu v? c v?i chuyn gia ch?m [place] s?c kh?e c?a qu v?. Document Revised: 05/01/2018 Document Reviewed: 05/01/2018 Elsevier Patient Education  [place] Th?c ?n cho B?nh Tro Ng??c D? Dy Th?c Qu?n, Ng??i L?n Food Choices for Gastroesophageal Reflux Disease, Adult Khi qu v? b? b?nh tro ng??c d? dy th?c qu?n (GERD), th?c ph?m qu v? ?n v thi quen ?n u?ng c?a qu v? l r?t quan tr?ng. L?a ch?n th?c ph?m ?ng c th? gip lm gi?m s? kh ch?u c?a b?nh tro ng??c d? dy th?c qu?n (GERD). Cn nh?c lm vi?c v?i m?t chuyn gia v? ch? ?? ?n v dinh d??ng (chuyn gia dinh d??ng) nh?m gip qu v? l?a ch?n nh?ng th?c ph?m t?t cho s?c kh?e. Nh?ng h??ng d?n chung no ti ph?i tun theo?  K? ho?ch ?n u?ng  Ch?n nh?ng th?c ph?m t?t cho s?c kh?e t ch?t bo, ch?ng h?n nh? tri cy, rau c?, ng? c?c nguyn h?t, cc s?n ph?m t? s?a t bo, v th?t n?c, c, v th?t gia c?m.  ?n cc b?a nh?, th??ng xuyn thay v ba b?a l?n m?i ngy. ?n ch?m ri, trong khng gian th? gin. Trnh g?p ng??i ho?c n?m xu?ng cho ??n khi ?n xong ???c 2-3 gi?.  H?n ch? cc th?c ph?m giu ch?t bo ch?ng h?n nh? th?t  nhi?u m? ho?c th?c ph?m chin.  Gi?i h?n l??ng tiu th? d?u, b?, v ch?t bo t? d?u th?c v?t ? m?c d??i 8 mu?ng c ph m?i ngy.  Tra?nh nh??ng th?? sau: ? Cc th?c ph?m gy ra cc tri?u ch?ng. Nh?ng tri?u ch?ng ny c th? khc nhau ??i v?i nh?ng ng??i khc nhau. Hy ghi nh?t k th?c ph?m ?? theo di nh?ng th?c ph?m no gy ra cc tri?u ch?ng. ? R??u. ? U?ng nhi?u ch?t l?ng khi ?n. ? ?n trong kho?ng 2-3 gi? tr??c khi ?i ng?.  N?u ?n b?ng cc ph??ng php khc thay vi? chin. Ph??ng php ny c th? bao g?m b? l, n??ng, ho?c hun nng. L?i s?ng  Duy tr m??c cn n?ng c l?i cho s?c kh?e. H?i chuyn gia ch?m [place] s?c kh?e c?a quy? vi? xem m??c cn n??ng na?o la? t?t cho quy? vi?. N?u qu v? c?n gi?m cn, hy trao ??i v?i chuyn gia ch?m [place] s?c kh?e c?a qu v? ?? gi?m cn m?t cch an ton.  T?p th? d?c t?i thi?u 30 pht t? 5 ngy tr? ln m?i tu?n, ho?c theo ch? d?n c?a chuyn gia ch?m [place] s?c kh?e.  Trnh m?c qu?n o b ch?t quanh th?t l?ng v ng?c.  Khng s? d?ng b?t k? s?n ph?m no ch?a nicotine ho?c thu?c l, ch?ng ha?n nh? thu?c l d?ng ht v thu?c l ?i?n t?. N?u qu v? c?n gip ?? ?? cai thu?c, hy h?i chuyn gia ch?m  s?c kh?e.  Ng? v?i ??u gi??ng k cao. S? d?ng chm d??i n?m ho?c t?m k d??i khung gi??ng ?? nng ??u gi??ng ln. Nh?ng lo?i th?c ?n no khng ???c khuyn dng? Nh?ng m?c ???c li?t k c th? khng ph?i danh sch ??y ??. Hy trao ??i  v?i bc s? chuyn khoa dinh d??ng xem cc l?a ch?n ch? ?? dinh d??ng no ph h?p nh?t v?i qu v?. Ng? c?c Bnh ng?t ho?c bnh m n??ng nhanh b? sung ch?t bo. Bnh m n??ng c?a Php. Rau c? Marlou Starks c? chin ng?p d?u. Khoai ty chin. M?i lo?i rau c? ???c ch? bi?n km thm ch?t bo. M?i lo?i rau c? gy ra cc tri?u ch?ng. ??i v?i m?t s? ng??i, nh?ng lo?i rau c? ny c th? bao g?m c chua v cc s?n ph?m t? c chua, ?t, hnh v t?i, v c?i ng?a. Tri cy M?i lo?i tri cy ???c ch? bi?n km thm ch?t bo. M?i lo?i tri cy c th? gy ra cc  tri?u ch?ng. ??i v?i m?t s? ng??i, nh?ng lo?i tri cy ny c th? bao g?m tri cy h? cam qut, ch?ng h?n nh? cam, b??i, qu? d?a, v chanh. Th?t v cc th?c ph?m ch?a protein khc Th?t giu ch?t bo, ch?ng h?n nh? th?t l?n ho?c th?t b nhi?u m?, xc xch, s??n, gi?m bng, l?p x??ng, [place] v th?t l?n mu?i xng khi. Th?t chin ho?c protein, bao g?m c chin v th?t g chin. Qu? h?ch v b? h?t. S?a. S?a nguyn kem v s?a s-c-la. Kem chuaSalome Spotted. Kem. Pho mt kem. S?a l?c. ?? u?ng C ph v tr, c ho?c khng c caffeine. ?? u?ng c ga. Soda. ?? u?ng t?ng l?c. N??c p tri cy lm t? tri cy chua (ch?ng h?n nh? cam ho?c b??i). N??c p c chua. ?? u?ng ch?a c?n. M? v d?u B?. B? th?c v?t. Ch?t bo t? d?u th?c v?t. B? s??a tru. K?o v ?? trng mi?ng S-c-la v c ca. Bnh rnCassell Smiles v? v cc th?c ph?m khc. H?t tiu. B?c h v b?c h l?c. M?i lo?i gia v?, th?o d??c, ho?c gia v? gy ra cc tri?u ch?ng. ??i v?i m?t s? ng??i, gia v? v cc th?c ph?m khc c th? bao g?m c ri, n??c s?t nng, ho?c d?u d?m ?? tr?n salad. Tm t?t  Khi qu v? b? b?nh tro ng??c d? dy th?c qu?n (GERD), cc l?a ch?n th?c ph?m v l?i s?ng c vai tr r?t quan tr?ng ?? gip lm gi?m s? kh ch?u c?a GERD.  ?n cc b?a nh?, th??ng xuyn thay v ba b?a l?n m?i ngy. ?n ch?m ri, trong khng gian th? gin. Trnh g?p ng??i ho?c n?m xu?ng cho ??n khi ?n xong ???c 2-3 gi?.  H?n ch? cc th?c ph?m giu ch?t bo, ch?ng h?n nh? th?t m? ho?c th?c ph?m chin. Thng tin ny khng nh?m m?c ?ch thay th? cho l?i khuyn m chuyn gia ch?m [place] s?c kh?e ni v?i qu v?. Hy b?o ??m qu v? ph?i th?o lu?n b?t k? v?n ?? g m qu v? c v?i chuyn gia ch?m [place] s?c kh?e c?a qu v?. Document Revised: 01/10/2017 Elsevier Patient Education  [employer].

## 2020-07-19 NOTE — Progress Notes (Signed)
Subjective:  [place] interpreter present with during entire visit.   Maria Garrison is a 54 y.o. female and is here for a comprehensive physical exam. Pt reports continued lower abdominal pain x 4-5 yrs despite work up.  Pt notes pain during intercourse and constipation.  In the past pt seen by OB/Gyn.  Pt may drink 2 bottles of water per day as she cannot take breaks at work to use the restroom.  May have a BM q 2-3 days.  Pt endorses heartburn.  Having to cough frequently which causes soreness in her chest.  Pt notes cough x 2-3 months.   Pt does not like trying OTC meds without approval from a provider.  Pt received an influenza vaccine 06/20/20.  LMP May or Nov 2020 per chart review.  Pt is fasting this visit.  Social History   Socioeconomic History  . Marital status: Married    Spouse name: Not on file  . Number of children: Not on file  . Years of education: Not on file  . Highest education level: Not on file  Occupational History  . Not on file  Tobacco Use  . Smoking status: Never Smoker  . Smokeless tobacco: Never Used  Vaping Use  . Vaping Use: Never used  Substance and Sexual Activity  . Alcohol use: No  . Drug use: No  . Sexual activity: Yes    Partners: Male    Birth control/protection: None  Other Topics Concern  . Not on file  Social History Narrative  . Not on file   Social Determinants of Health   Financial Resource Strain:   . Difficulty of Paying Living Expenses: Not on file  Food Insecurity:   . Worried About Charity fundraiser in the Last Year: Not on file  . Ran Out of Food in the Last Year: Not on file  Transportation Needs:   . Lack of Transportation (Medical): Not on file  . Lack of Transportation (Non-Medical): Not on file  Physical Activity:   . Days of Exercise per Week: Not on file  . Minutes of Exercise per Session: Not on file  Stress:   . Feeling of Stress : Not on file  Social Connections:   . Frequency of Communication with Friends  and Family: Not on file  . Frequency of Social Gatherings with Friends and Family: Not on file  . Attends Religious Services: Not on file  . Active Member of Clubs or Organizations: Not on file  . Attends Archivist Meetings: Not on file  . Marital Status: Not on file  Intimate Partner Violence:   . Fear of Current or Ex-Partner: Not on file  . Emotionally Abused: Not on file  . Physically Abused: Not on file  . Sexually Abused: Not on file   Health Maintenance  Topic Date Due  . Hepatitis C Screening  Never done  . HIV Screening  Never done  . INFLUENZA VACCINE  05/07/2020  . MAMMOGRAM  10/06/2021  . PAP SMEAR-Modifier  09/28/2022  . TETANUS/TDAP  07/26/2025  . COLONOSCOPY  02/25/2028  . COVID-19 Vaccine  Completed    The following portions of the patient's history were reviewed and updated as appropriate: allergies, current medications, past family history, past medical history, past social history, past surgical history and problem list.  Review of Systems Pertinent items noted in HPI and remainder of comprehensive ROS otherwise negative.   Objective:    BP 118/76 (BP Location: Right Arm, Patient Position:  Sitting, Cuff Size: Normal)   Pulse 74   Temp 98.5 F (36.9 C) (Oral)   Ht 5' (1.524 m)   Wt 124 lb 3.2 oz (56.3 kg)   SpO2 99%   BMI 24.26 kg/m  General appearance: alert, cooperative and no distress Head: Normocephalic, without obvious abnormality, atraumatic Eyes: conjunctivae/corneas clear. PERRL, EOM's intact. Fundi benign. Ears: normal TM's and external ear canals both ears Nose: Nares normal. Septum midline. Mucosa normal. No drainage or sinus tenderness. Throat: lips, mucosa, and tongue normal; teeth and gums normal Neck: no adenopathy, no carotid bruit, no JVD, supple, symmetrical, trachea midline and thyroid not enlarged, symmetric, no tenderness/mass/nodules Lungs: clear to auscultation bilaterally Heart: regular rate and rhythm, S1, S2  normal, no murmur, click, rub or gallop Abdomen: BS present, soft, TTP in RUQ, RLQ, and LLQ. Extremities: extremities normal, atraumatic, no cyanosis or edema Pulses: 2+ and symmetric Skin: Skin color, texture, turgor normal. No rashes or lesions Lymph nodes: Cervical, supraclavicular, and axillary nodes normal. Neurologic: Alert and oriented X 3, normal strength and tone. Normal symmetric reflexes. Normal coordination and gait    Assessment:    Healthy female exam with chronic abd pain and heart burn.      Plan:     Anticipatory guidance given including wearing seatbelts, smoke detectors in the home, increasing physical activity, increasing p.o. intake of water and vegetables. -will obtain labs -colonoscopy done 02/24/2018 -mammogram done 10/07/2019 -pap 03/17/2017 -given handout -Next CPE in 1 yr See After Visit Summary for Counseling Recommendations    Constipation, unspecified constipation type  -discussed increasing po intake of water and fluids as well as fiber. -given hanodut - Plan: CMP with eGFR([facility]), CBC with Differential/Platelet, Lipid panel  Gastroesophageal reflux disease, unspecified whether esophagitis present  -discussed likely contributing to cough -discuss trial of PPI -avoid foods known to cause symptoms -given handout - Plan: CMP with eGFR([facility]), CBC with Differential/Platelet, Lipase, Lipid panel  Urinary frequency  -constipation may be contributing.  Will obtain UA to r/o UTI.  Will also obtain hgb A1C to r/o DM -UA with 1+ blood, SG 1.015. - Plan: Hemoglobin A1c, POCT urinalysis dipstick, Urinalysis with Reflex Microscopic, Urine Culture, Hemoglobin A1c  Chronic generalized abdominal pain -discussed possible causes including constipation, ovarian cyst, endometriosis, fibroids. -CT abd/pelvis 12/22/19 reviewed: Overall impression negative. Findings included small left renal cyst. -Transvaginal u/s 09/16/19 reviewed.  L pelvic congestion noted. -will  see if pt has improvement in symptoms with regular bowel regimen.  If no improvement consider repeat imaging of abd/pelvis.  F/u in 1 month or sooner if needed  Grier Mitts, MD

## 2020-07-20 LAB — CBC WITH DIFFERENTIAL/PLATELET
Absolute Monocytes: 336 cells/uL (ref 200–950)
Basophils Absolute: 21 cells/uL (ref 0–200)
Basophils Relative: 0.6 %
Eosinophils Absolute: 70 cells/uL (ref 15–500)
Eosinophils Relative: 2 %
HCT: 40.1 % (ref 35.0–45.0)
Hemoglobin: 12.9 g/dL (ref 11.7–15.5)
Lymphs Abs: 1071 cells/uL (ref 850–3900)
MCH: 28.6 pg (ref 27.0–33.0)
MCHC: 32.2 g/dL (ref 32.0–36.0)
MCV: 88.9 fL (ref 80.0–100.0)
MPV: 9.5 fL (ref 7.5–12.5)
Monocytes Relative: 9.6 %
Neutro Abs: 2002 cells/uL (ref 1500–7800)
Neutrophils Relative %: 57.2 %
Platelets: 280 10*3/uL (ref 140–400)
RBC: 4.51 10*6/uL (ref 3.80–5.10)
RDW: 12.5 % (ref 11.0–15.0)
Total Lymphocyte: 30.6 %
WBC: 3.5 10*3/uL — ABNORMAL LOW (ref 3.8–10.8)

## 2020-07-20 LAB — COMPLETE METABOLIC PANEL WITH GFR
AG Ratio: 1.5 (calc) (ref 1.0–2.5)
ALT: 17 U/L (ref 6–29)
AST: 21 U/L (ref 10–35)
Albumin: 4.1 g/dL (ref 3.6–5.1)
Alkaline phosphatase (APISO): 99 U/L (ref 37–153)
BUN: 11 mg/dL (ref 7–25)
CO2: 31 mmol/L (ref 20–32)
Calcium: 9.6 mg/dL (ref 8.6–10.4)
Chloride: 103 mmol/L (ref 98–110)
Creat: 0.82 mg/dL (ref 0.50–1.05)
GFR, Est African American: 95 mL/min/{1.73_m2} (ref >=60)
GFR, Est Non African American: 82 mL/min/{1.73_m2} (ref >=60)
Globulin: 2.8 g/dL (calc) (ref 1.9–3.7)
Glucose, Bld: 81 mg/dL (ref 65–99)
Potassium: 4.4 mmol/L (ref 3.5–5.3)
Sodium: 140 mmol/L (ref 135–146)
Total Bilirubin: 0.5 mg/dL (ref 0.2–1.2)
Total Protein: 6.9 g/dL (ref 6.1–8.1)

## 2020-07-20 LAB — LIPID PANEL
Cholesterol: 216 mg/dL — ABNORMAL HIGH (ref <200)
HDL: 68 mg/dL (ref >=50)
LDL Cholesterol (Calc): 128 mg/dL (calc) — ABNORMAL HIGH
Non-HDL Cholesterol (Calc): 148 mg/dL (calc) — ABNORMAL HIGH (ref <130)
Total CHOL/HDL Ratio: 3.2 (calc) (ref <5.0)
Triglycerides: 92 mg/dL (ref <150)

## 2020-07-20 LAB — HEMOGLOBIN A1C
Hgb A1c MFr Bld: 5.5 % of total Hgb (ref <5.7)
Mean Plasma Glucose: 111 (calc)
eAG (mmol/L): 6.2 (calc)

## 2020-07-20 LAB — LIPASE: Lipase: 63 U/L — ABNORMAL HIGH (ref 7–60)

## 2020-07-21 LAB — URINALYSIS, ROUTINE W REFLEX MICROSCOPIC
Bilirubin Urine: NEGATIVE
Glucose, UA: NEGATIVE
Hgb urine dipstick: NEGATIVE
Ketones, ur: NEGATIVE
Leukocytes,Ua: NEGATIVE
Nitrite: NEGATIVE
Protein, ur: NEGATIVE
Specific Gravity, Urine: 1.011 (ref 1.001–1.03)
pH: <=5 (ref 5.0–8.0)

## 2020-07-21 LAB — URINE CULTURE
MICRO NUMBER:: 11066884
SPECIMEN QUALITY:: ADEQUATE

## 2020-07-24 ENCOUNTER — Other Ambulatory Visit: Payer: Self-pay | Admitting: Family Medicine

## 2020-07-24 DIAGNOSIS — N3001 Acute cystitis with hematuria: Secondary | ICD-10-CM

## 2020-07-24 MED ORDER — AMOXICILLIN-POT CLAVULANATE 500-125 MG PO TABS: 1.0000 | ORAL_TABLET | Freq: Two times a day (BID) | ORAL | 0 refills | Status: AC

## 2020-11-22 ENCOUNTER — Other Ambulatory Visit (HOSPITAL_COMMUNITY): Payer: Self-pay | Admitting: Internal Medicine

## 2020-11-22 ENCOUNTER — Ambulatory Visit: Payer: Managed Care, Other (non HMO) | Attending: Internal Medicine

## 2020-11-22 DIAGNOSIS — Z23 Encounter for immunization: Secondary | ICD-10-CM

## 2020-11-22 NOTE — Progress Notes (Signed)
   Covid-19 Vaccination Clinic  Name:  Maria Garrison    MRN: [id number] DOB: December 15, 1965  11/22/2020  [name] was observed post Covid-19 immunization for 15 minutes without incident. She was provided with Vaccine Information Sheet and instruction to access the V-Safe system.   [name] was instructed to call 911 with any severe reactions post vaccine: Marland Kitchen Difficulty breathing  . Swelling of face and throat  . A fast heartbeat  . A bad rash all over body  . Dizziness and weakness   Immunizations Administered    Name Date Dose VIS Date Route   PFIZER Comrnaty(Gray TOP) Covid-19 Vaccine 11/22/2020 10:48 AM 0.3 mL 09/14/2020 Intramuscular   Manufacturer: [employer], [employer]   Lot: [id number]   NDC: [phone]

## 2021-02-06 ENCOUNTER — Other Ambulatory Visit: Payer: Self-pay

## 2021-02-07 ENCOUNTER — Ambulatory Visit: Payer: Managed Care, Other (non HMO) | Admitting: Family Medicine

## 2021-02-12 ENCOUNTER — Other Ambulatory Visit: Payer: Self-pay

## 2021-02-12 ENCOUNTER — Encounter: Payer: Self-pay | Admitting: Family Medicine

## 2021-02-12 ENCOUNTER — Ambulatory Visit: Payer: Managed Care, Other (non HMO) | Admitting: Family Medicine

## 2021-02-12 VITALS — BP 120/78 | HR 70 | Temp 98.0°F | Wt 125.4 lb

## 2021-02-12 DIAGNOSIS — K640 First degree hemorrhoids: Secondary | ICD-10-CM

## 2021-02-12 DIAGNOSIS — K5909 Other constipation: Secondary | ICD-10-CM

## 2021-02-12 DIAGNOSIS — R1012 Left upper quadrant pain: Secondary | ICD-10-CM | POA: Diagnosis not present

## 2021-02-12 MED ORDER — HYDROCORTISONE (PERIANAL) 2.5 % EX CREA: 1.0000 "application " | TOPICAL_CREAM | Freq: Two times a day (BID) | CUTANEOUS | 1 refills | Status: DC

## 2021-02-12 MED ORDER — POLYETHYLENE GLYCOL 3350 17 GM/SCOOP PO POWD: 17.0000 g | Freq: Every day | ORAL | 1 refills | Status: DC | PRN

## 2021-02-12 NOTE — Patient Instructions (Signed)
B?nh tr? Hemorrhoids B?nh tr? l cc t?nh m?ch ? trong ho?c xung quanh tr?c trng ho?c h?u mn b? s?ng ln. C hai lo?i b?nh tr?:  B?nh tr? n?i. B?nh x?y ra v?i nh?ng t?nh m?ch ? ngay bn trong tr?c trng. Cc m?ch mu c th? tr?i ra ngoi v b? kch ?ng v ?au ??n.  B?nh tr? ngo?i. B?nh x?y ra ? nh?ng t?nh m?ch bn ngoi h?u mn v c th? s? th?y nh? m?t c?c s?ng ?au ho?c c?ng g?n h?u mn. H?u h?t cc bi tr? khng gy v?n ?? nghim tr?ng v c th? ???c x? tr b?ng cch ?i?u tr? t?i nh nh? thay ??i ch? ?? ?n v thay ??i l?i s?ng. N?u ?i?u tr? t?i nh khng lm gi?m tri?u ch?ng, c th? c?n th?c hi?n cc th? thu?t lm co nh? ho?c c?t bi tr?Lourdes Sledge nhn g gy ra? B?nh ny do p l?c t?ng ln ? khu v?c h?u mn gy ra. p l?c ny c th? do nhi?u nguyn nhn khc nhau, bao g?m:  To bn.  Ph?i r?n m?nh ?? ??i ti?n.  Tiu ch?y.  Mang [place].  Bo ph.  Ng?i trong th?i gian di.  Nng v?t n?ng ho?c ho?t ??ng khc lm qu v? g?ng s?c.  Quan h? tnh d?c qua h?u mn.  ??p xe trong m?t th?i gian di. Cc d?u hi?u ho?c tri?u ch?ng l g? Nh?ng tri?u ch?ng c?a tnh tr?ng ny bao g?m:  ?au.  Ng?a ho?c kch ?ng h?u mn.  Ch?y mu tr?c trng.  R r? phn (phn).  S?ng h?u mn.  M?t ho?c nhi?u u c?c xung quanh h?u mn. Ch?n ?on tnh tr?ng ny nh? th? no? Tnh tr?ng ny th??ng c th? ???c ch?n ?on b?ng cch ki?m tra qua tr?c quan. Cc ki?m tra ho?c xt nghi?m khc c?ng c th? ???c th?c hi?n, ch?ng h?n nh?:  M?t l?n khm bao g?m khm vng tr?c trng b?ng tay ?eo g?ng (khm tr?c trng b?ng ngn tay).  M?t l?n khm h?u mn ???c th?c hi?n b?ng cch s? d?ng m?t ?ng nh? (?ng soi h?u mn).  Xt nghi?m mu, n?u qu v? b? m?t m?t l??ng mu ?ng k?.  M?t ki?m tra xem bn trong ??i trng b?ng cch s? d?ng m?t ?ng m?m c camera ? ??u (soi ??i trng sigma ho?c n?i soi ??i trng). Tnh tr?ng ny ???c ?i?u tr? nh? th? no? Tnh tr?ng ny th??ng c th? ???c ?i?u tr? t?i nh. Tuy nhin, c th?  c?n lm nhi?u lo?i th? thu?t khc nhau n?u vi?c thay ??i ch? ?? ?n u?ng, thay ??i l?i s?ng v cc bi?n php ?i?u tr? khc t?i nh khng lm gi?m tri?u ch?ng. Nh?ng th? thu?t ny c th? gip lm cho cc bi tr? nh? h?n ho?c c?t b? chng hon ton. M?t s? th? thu?t lin quan ??n ph?u thu?t v m?t s? khc th khng. Cc th? thu?t ph? bi?n bao g?m:  Th?t tr? b?ng vng cao su. Cc vng cao su ???c ??t ? g?c cc bi tr? ?? ng?t dng mu cung c?p ??n bi tr?Marland Kitchen  Li?u php x? ha. Thu?c ???c tim vo bi tr? ?? lm cc bi tr? ? co nh? l?i.  Lm ?ng b?ng tia h?ng ngo?i. M?t lo?i n?ng l??ng nh? ???c s? d?ng ?? lo?i b? tr?.  Ph?u thu?t c?t tr?. Bi tr? ???c c?t b? b?ng ph?u thu?t v cc t?nh m?ch cung c?p mu ??n cc bi tr? ny ???c  th?t l?i.  Ph?u thu?t c?t tr? b?ng k?p. Bc s? ph?u thu?t s? ghim chn c?a bi tr? vo thnh tr?c trng. Tun th? nh?ng h??ng d?n ny ? nh: ?n v u?ng  ?n th?c ?n c nhi?u ch?t x? nh? ng? c?c nguyn h?t, cc lo?i ??u, h?t, tri cy v rau.  Hy h?i chuyn gia ch?m [place] s?c kh?e v? vi?c dng cc s?n ph?m c thm ch?t x? (th?c ph?m b? sung ch?t x?).  Gi?m l??ng ch?t bo trong ch? ?? ?n c?a qu v?. Qu v? c th? lm ?i?u ny b?ng cch ?n cc s?n ph?m s?a t bo, ?n t th?t ?? h?n v trnh cc th?c ph?m ch? bi?n s?n.  U?ng ?? n??c ?? gi? cho n??c ti?u c mu vng nh?t.   X? tr ?au v s?ng n?  Ngm mng b?ng n??c ?m trong 20 pht, 3-4 l?n m?i ngy ?? gi?m ?au v gi?m c?m gic kh ch?u. Qu v? c th? th?c hi?n ?i?u ny trong b?n t?m ho?c s? d?ng ch?u ngm mng l?u ??ng v?a v?i b?n c?u.  N?u ???c ch? d?n, ch??m ? l?nh vo vng b? ?nh h??ng. S? d?ng cc ti ch??m l?nh gi?a nh?ng l?n t?m ng?i c th? c tc d?ng. ? Cho ? l?nh vo ti ni lng. ? ?? kh?n t?m ? gi?a da v ti ch??m. ? Ch??m ? l?nh trong 20 pht, 2-3 l?n m?i ngy.   H??ng d?n chung  Ch? s? d?ng thu?c khng k ??n v thu?c k ??n theo ch? d?n c?a chuyn gia ch?m [place] s?c kh?e.  S? d?ng thu?c d?ng kem ho?c  vin ??n theo ch? d?n.  T?p th? d?c th??ng xuyn. Hy h?i chuyn gia ch?m [place] s?c kh?e v? th?i l??ng v lo?i bi t?p no an ton cho qu v?. Ni chung, qu v? nn t?p th? d?c c c??ng ?? v?a ph?i trong t nh?t l 30 pht vo h?u h?t cc ngy trong tu?n (150 pht m?i tu?n). Vi?c ny c th? bao g?m cc ho?t ??ng nh? ?i b?, ??p xe, ho?c yoga.  Vo nh v? sinh khi qu v? bu?n ?i ??i ti?n. Khng ch? ??i.  Trnh r?n m?nh khi ?i ??i ti?n.  Gi? cho vng h?u mn kh v s?ch. S? d?ng gi?y v? sinh ??t ho?c kh?n gi?y v? sinh ?m sau khi ?i ??i ti?n.  Khng ng?i lu trong nh v? sinh. Vi?c ny lm t?ng ? mu v ?au.  Tun th? t?t c? cc l?n khm theo di theo ch? d?n c?a chuyn gia ch?m [place] s?c kh?e. ?i?u ny c vai tr quan tr?ng. Hy lin l?c v?i chuyn gia ch?m  s?c kh?e n?u qu v? b?:  ?au v s?ng t?ng ln m khng ki?m sot ???c b?ng ?i?u tr? ho?c b?ng thu?c.  ??i ti?n kh kh?n, ho?c qu v? khng th? ??i ti?n ???c.  ?au ho?c vim ? ngoi khu v?c c?a cc bi tr?Cathie Hoops c?u tr? gip ngay l?p t?c n?u qu v? c:  Ch?y mu khng ki?m sot ???c ? tr?c trng. Tm t?t  B?nh tr? l cc t?nh m?ch ? trong ho?c xung quanh tr?c trng ho?c h?u mn b? s?ng ln.  H?u h?t cc bi tr? c th? ???c x? tr b?ng cch ?i?u tr? t?i nh nh? thay ??i ch? ?? ?n v thay ??i l?i s?ng.  Ngm mng b?ng n??c ?m c th? gip gi?m ?au v gi?m c?m gic kh ch?u.  Trong cc tr??ng h?p n?ng,  th? thu?t ho?c ph?u thu?t c th? ???c th?c hi?n ?? thu nh? ho?c c?t b? bi tr?Gery Pray tin ny khng nh?m m?c ?ch thay th? cho l?i khuyn m chuyn gia ch?m [place] s?c kh?e ni v?i qu v?. Hy b?o ??m qu v? ph?i th?o lu?n b?t k? v?n ?? g m qu v? c v?i chuyn gia ch?m [place] s?c kh?e c?a qu v?. Document Revised: 04/02/2018 Document Reviewed: 04/02/2018 Elsevier Patient Education  2021 Elsevier Inc.  B?nh tr? Hemorrhoids B?nh tr? l cc t?nh m?ch ? trong ho?c xung quanh tr?c trng ho?c h?u mn b? s?ng ln. C hai lo?i b?nh tr?:  B?nh  tr? n?i. B?nh x?y ra v?i nh?ng t?nh m?ch ? ngay bn trong tr?c trng. Cc m?ch mu c th? tr?i ra ngoi v b? kch ?ng v ?au ??n.  B?nh tr? ngo?i. B?nh x?y ra ? nh?ng t?nh m?ch bn ngoi h?u mn v c th? s? th?y nh? m?t c?c s?ng ?au ho?c c?ng g?n h?u mn. H?u h?t cc bi tr? khng gy v?n ?? nghim tr?ng v c th? ???c x? tr b?ng cch ?i?u tr? t?i nh nh? thay ??i ch? ?? ?n v thay ??i l?i s?ng. N?u ?i?u tr? t?i nh khng lm gi?m tri?u ch?ng, c th? c?n th?c hi?n cc th? thu?t lm co nh? ho?c c?t bi tr?[place] nhn g gy ra? B?nh ny do p l?c t?ng ln ? khu v?c h?u mn gy ra. p l?c ny c th? do nhi?u nguyn nhn khc nhau, bao g?m:  To bn.  Ph?i r?n m?nh ?? ??i ti?n.  Tiu ch?y.  Mang [place].  Bo ph.  Ng?i trong th?i gian di.  Nng v?t n?ng ho?c ho?t ??ng khc lm qu v? g?ng s?c.  Quan h? tnh d?c qua h?u mn.  ??p xe trong m?t th?i gian di. Cc d?u hi?u ho?c tri?u ch?ng l g? Nh?ng tri?u ch?ng c?a tnh tr?ng ny bao g?m:  ?au.  Ng?a ho?c kch ?ng h?u mn.  Ch?y mu tr?c trng.  R r? phn (phn).  S?ng h?u mn.  M?t ho?c nhi?u u c?c xung quanh h?u mn. Ch?n ?on tnh tr?ng ny nh? th? no? Tnh tr?ng ny th??ng c th? ???c ch?n ?on b?ng cch ki?m tra qua tr?c quan. Cc ki?m tra ho?c xt nghi?m khc c?ng c th? ???c th?c hi?n, ch?ng h?n nh?:  M?t l?n khm bao g?m khm vng tr?c trng b?ng tay ?eo g?ng (khm tr?c trng b?ng ngn tay).  M?t l?n khm h?u mn ???c th?c hi?n b?ng cch s? d?ng m?t ?ng nh? (?ng soi h?u mn).  Xt nghi?m mu, n?u qu v? b? m?t m?t l??ng mu ?ng k?.  M?t ki?m tra xem bn trong ??i trng b?ng cch s? d?ng m?t ?ng m?m c camera ? ??u (soi ??i trng sigma ho?c n?i soi ??i trng). Tnh tr?ng ny ???c ?i?u tr? nh? th? no? Tnh tr?ng ny th??ng c th? ???c ?i?u tr? t?i nh. Tuy nhin, c th? c?n lm nhi?u lo?i th? thu?t khc nhau n?u vi?c thay ??i ch? ?? ?n u?ng, thay ??i l?i s?ng v cc bi?n php ?i?u tr? khc t?i nh khng lm  gi?m tri?u ch?ng. Nh?ng th? thu?t ny c th? gip lm cho cc bi tr? nh? h?n ho?c c?t b? chng hon ton. M?t s? th? thu?t lin quan ??n ph?u thu?t v m?t s? khc th khng. Cc th? thu?t ph? bi?n bao g?m:  Th?t tr? b?ng vng cao su. Cc vng  cao su ???c ??t ? g?c cc bi tr? ?? ng?t dng mu cung c?p ??n bi tr?Marland Kitchen  Li?u php x? ha. Thu?c ???c tim vo bi tr? ?? lm cc bi tr? ? co nh? l?i.  Lm ?ng b?ng tia h?ng ngo?i. M?t lo?i n?ng l??ng nh? ???c s? d?ng ?? lo?i b? tr?.  Ph?u thu?t c?t tr?. Bi tr? ???c c?t b? b?ng ph?u thu?t v cc t?nh m?ch cung c?p mu ??n cc bi tr? ny ???c th?t l?i.  Ph?u thu?t c?t tr? b?ng k?p. Bc s? ph?u thu?t s? ghim chn c?a bi tr? vo thnh tr?c trng. Tun th? nh?ng h??ng d?n ny ? nh: ?n v u?ng  ?n th?c ?n c nhi?u ch?t x? nh? ng? c?c nguyn h?t, cc lo?i ??u, h?t, tri cy v rau.  Hy h?i chuyn gia ch?m [place] s?c kh?e v? vi?c dng cc s?n ph?m c thm ch?t x? (th?c ph?m b? sung ch?t x?).  Gi?m l??ng ch?t bo trong ch? ?? ?n c?a qu v?. Qu v? c th? lm ?i?u ny b?ng cch ?n cc s?n ph?m s?a t bo, ?n t th?t ?? h?n v trnh cc th?c ph?m ch? bi?n s?n.  U?ng ?? n??c ?? gi? cho n??c ti?u c mu vng nh?t.   X? tr ?au v s?ng n?  Ngm mng b?ng n??c ?m trong 20 pht, 3-4 l?n m?i ngy ?? gi?m ?au v gi?m c?m gic kh ch?u. Qu v? c th? th?c hi?n ?i?u ny trong b?n t?m ho?c s? d?ng ch?u ngm mng l?u ??ng v?a v?i b?n c?u.  N?u ???c ch? d?n, ch??m ? l?nh vo vng b? ?nh h??ng. S? d?ng cc ti ch??m l?nh gi?a nh?ng l?n t?m ng?i c th? c tc d?ng. ? Cho ? l?nh vo ti ni lng. ? ?? kh?n t?m ? gi?a da v ti ch??m. ? Ch??m ? l?nh trong 20 pht, 2-3 l?n m?i ngy.   H??ng d?n chung  Ch? s? d?ng thu?c khng k ??n v thu?c k ??n theo ch? d?n c?a chuyn gia ch?m [place] s?c kh?e.  S? d?ng thu?c d?ng kem ho?c vin ??n theo ch? d?n.  T?p th? d?c th??ng xuyn. Hy h?i chuyn gia ch?m [place] s?c kh?e v? th?i l??ng v lo?i bi t?p no an ton cho qu v?.  Ni chung, qu v? nn t?p th? d?c c c??ng ?? v?a ph?i trong t nh?t l 30 pht vo h?u h?t cc ngy trong tu?n (150 pht m?i tu?n). Vi?c ny c th? bao g?m cc ho?t ??ng nh? ?i b?, ??p xe, ho?c yoga.  Vo nh v? sinh khi qu v? bu?n ?i ??i ti?n. Khng ch? ??i.  Trnh r?n m?nh khi ?i ??i ti?n.  Gi? cho vng h?u mn kh v s?ch. S? d?ng gi?y v? sinh ??t ho?c kh?n gi?y v? sinh ?m sau khi ?i ??i ti?n.  Khng ng?i lu trong nh v? sinh. Vi?c ny lm t?ng ? mu v ?au.  Tun th? t?t c? cc l?n khm theo di theo ch? d?n c?a chuyn gia ch?m [place] s?c kh?e. ?i?u ny c vai tr quan tr?ng. Hy lin l?c v?i chuyn gia ch?m [place] s?c kh?e n?u qu v? b?:  ?au v s?ng t?ng ln m khng ki?m sot ???c b?ng ?i?u tr? ho?c b?ng thu?c.  ??i ti?n kh kh?n, ho?c qu v? khng th? ??i ti?n ???c.  ?au ho?c vim ? ngoi khu v?c c?a cc bi tr?Cathie Hoops c?u tr? gip ngay l?p t?c n?u qu v? c:  Ch?y mu khng ki?m sot ???c ? tr?c trng. Tm t?t  B?nh tr? l cc t?nh m?ch ? trong ho?c xung quanh tr?c trng ho?c h?u mn b? s?ng ln.  H?u h?t cc bi tr? c th? ???c x? tr b?ng cch ?i?u tr? t?i nh nh? thay ??i ch? ?? ?n v thay ??i l?i s?ng.  Ngm mng b?ng n??c ?m c th? gip gi?m ?au v gi?m c?m gic kh ch?u.  Trong cc tr??ng h?p n?ng, th? thu?t ho?c ph?u thu?t c th? ???c th?c hi?n ?? thu nh? ho?c c?t b? bi tr?Gery Pray. Thng tin ny khng nh?m m?c ?ch thay th? cho l?i khuyn m chuyn gia ch?m [place] s?c kh?e ni v?i qu v?. Hy b?o ??m qu v? ph?i th?o lu?n b?t k? v?n ?? g m qu v? c v?i chuyn gia ch?m [place] s?c kh?e c?a qu v?. Document Revised: 04/02/2018 Document Reviewed: 04/02/2018 Elsevier Patient Education  2021 Elsevier Inc.  To bn, Ng??i l?n Constipation, Adult To bn l khi m?t ng??i ?i ??i ti?n t h?n ba l?n trong m?t tu?n, ??i ti?n kh kh?n, ho?c ??i ti?n ra phn (phn) kh, c?ng, ho?c to h?n bnh th??ng. To bn c th? do m?t tnh tr?ng bn trong gy ra. N c th? tr? ln tr?m tr?ng h?n theo ??  tu?i n?u m?t ng??i dng cc lo?i thu?c nh?t ??nh v khng u?ng ?? n??c. Tun th? nh?ng h??ng d?n ny ? nh: ?n v u?ng  ?n th?c ?n c nhi?u ch?t x? nh? ??u, ng? c?c nguyn h?t, tri cy t??i v rau c? t??i.  H?n ch? cc lo?i th?c ?n t ch?t x? v giu ch?t bo v ???ng tinh luy?n, ch?ng h?n nh? ?? ?n chin rn ho?c ?? ng?t. Nh?ng th?c ph?m ny bao g?m khoai ty chin, bnh hamburger, bnh quy, k?o v soda.  U?ng ?? n??c ?? gi? cho n??c ti?u c mu vng nh?t.   H??ng d?n chung  T?p th? d?c th??ng xuyn theo ch? d?n c?a chuyn gia ch?m [place] s?c kh?e. C? g?ng t?p th? d?c ? m?c ?? v?a ph?i 150 pht m?i tu?n.  ?i v? sinh khi qu v? bu?n ?i ??i ti?n. Khng nh?n ?i ??i ti?n.  Ch? s? d?ng thu?c khng k ??n v thu?c k ??n theo ch? d?n c?a chuyn gia ch?m [place] s?c kh?e. Thu?c ny g?m c? b?t k? lo?i th?c ph?m ch?c n?ng c ch?t x? no.  Trong khi ??i ti?n: ? Th?c hnh th? su trong khi th? l?ng b?ng d??i. ? Th?c hnh th? l?ng sn ch?u.  Theo di tnh tr?ng c?a qu v? ?? pht hi?n b?t k? thay ??i no. Hy cho nguyn gia ch?m [place] s?c kh?e bi?t v? cc thay ??i ?.  Tun th? t?t c? cc l?n khm theo di theo ch? d?n c?a chuyn gia ch?m [place] s?c kh?e. ?i?u ny c vai tr quan tr?ng. Hy lin l?c v?i chuyn gia ch?m  s?c kh?e n?u:  Qu v? b? ?au d? d?i h?n.  Qu v? b? s?t.  Qu v? khng ?i ??i ti?n sau 4 ngy.  Qu v? nn.  Qu v? khng ?i ho?c qu v? b? s?t cn.  Qu v? b? ch?y mu t? l? gi?a hai mng (h?u mn).  Phn c?a qu v? m?ng, trng nh? bt ch. Yu c?u tr? gip ngay l?p t?c n?u:  Qu v? b? s?t v cc tri?u ch?ng c?a qu v? ??t nhin tr?m tr?ng h?n.  Qu v? sn phn  ho?c c mu trong phn.  B?ng c?a qu v? b? ch??ng ln.  Qu v? b? ?au r?t nhi?u ? b?ng.  Qu v? c?m th?y chng m?t ho?c ng?t x?u. Tm t?t  To bn l khi m?t ng??i ?i ??i ti?n t h?n ba l?n trong m?t tu?n, ??i ti?n kh kh?n, ho?c ??i ti?n ra phn (phn) kh, c?ng, ho?c to h?n bnh th??ng.  ?n th?c ?n c  nhi?u ch?t x? nh? ??u, ng? c?c nguyn h?t, tri cy t??i v rau c? t??i.  U?ng ?? n??c ?? gi? cho n??c ti?u c mu vng nh?t.  Ch? s? d?ng thu?c khng k ??n v thu?c k ??n theo ch? d?n c?a chuyn gia ch?m [place] s?c kh?e. Thu?c ny g?m c? b?t k? lo?i th?c ph?m ch?c n?ng c ch?t x? no. Thng tin ny khng nh?m m?c ?ch thay th? cho l?i khuyn m chuyn gia ch?m [place] s?c kh?e ni v?i qu v?. Hy b?o ??m qu v? ph?i th?o lu?n b?t k? v?n ?? g m qu v? c v?i chuyn gia ch?m [place] s?c kh?e c?a qu v?. Document Revised: 10/27/2019 Document Reviewed: 10/27/2019 Elsevier Patient Education  2021 [employer].

## 2021-02-12 NOTE — Progress Notes (Signed)
Subjective:    Patient ID: Maria Garrison, female    DOB: [birth year], 55 y.o.   MRN: [id number]  Chief Complaint  Patient presents with  . Hemorrhoids    Stated she has had the over a year, gotten worse the last 4-5 months. Has not tried or taken anything for pain or to help.  [place] interpreter present.  HPI Patient was seen today for ongoing concerns.  Patient endorses rectal pruritus, burning times a while and chronic constipation.  May have BM every few days. Trying to drink at least 6 bottles of water per day.  Has not tried anything for her symptoms.  Pt had a few episodes of black colored stools and blood on stools.  No recent episodes.  Pt endorses LLQ abd pain x yrs. Has concerned it may be her uterus.  Last Pap 12/23/202020 normal.  Past Medical History:  Diagnosis Date  . Abdominal pain in female    For 3-4 years  . COVID-19 10/11/2019  . Heart murmur   . Microscopic hematuria 2021  . Spinal stenosis   . Urinary incontinence     No Known Allergies  ROS General: Denies fever, chills, night sweats, changes in weight, changes in appetite HEENT: Denies headaches, ear pain, changes in vision, rhinorrhea, sore throat CV: Denies CP, palpitations, SOB, orthopnea Pulm: Denies SOB, cough, wheezing GI: Denies nausea, vomiting, diarrhea, +constipation, abdominal pain, anal pruritis and burning, h/o dark colored stools GU: Denies dysuria, hematuria, frequency, vaginal discharge Msk: Denies muscle cramps, joint pains Neuro: Denies weakness, numbness, tingling Skin: Denies rashes, bruising Psych: Denies depression, anxiety, hallucinations     Objective:    Blood pressure 120/78, pulse 70, temperature 98 F (36.7 C), temperature source Oral, weight 125 lb 6.4 oz (56.9 kg), SpO2 97 %.  Gen. Pleasant, well-nourished, in no distress, normal affect   HEENT: [place]/AT, face symmetric, conjunctiva clear, no scleral icterus, PERRLA, EOMI, nares patent without drainage Lungs: no accessory  muscle use Cardiovascular: RRR, no peripheral edema Abdomen: BS present, soft, NT/ND GU: Normal external female genitalia.  Small external hemorrhoids noted.  Normal sphincter tone, smooth with small amount of brown stool.  Guaiac negative.  Chaperone present T. Wase, CNA Musculoskeletal: No deformities, no cyanosis or clubbing, normal tone Neuro:  A&Ox3, CN II-XII intact, normal gait Skin:  Warm, no lesions/ rash   Wt Readings from Last 3 Encounters:  02/12/21 125 lb 6.4 oz (56.9 kg)  07/19/20 124 lb 3.2 oz (56.3 kg)  11/22/19 121 lb 9.6 oz (55.2 kg)    Lab Results  Component Value Date   WBC 3.5 (L) 07/19/2020   HGB 12.9 07/19/2020   HCT 40.1 07/19/2020   PLT 280 07/19/2020   GLUCOSE 81 07/19/2020   CHOL 216 (H) 07/19/2020   TRIG 92 07/19/2020   HDL 68 07/19/2020   LDLCALC 128 (H) 07/19/2020   ALT 17 07/19/2020   AST 21 07/19/2020   NA 140 07/19/2020   K 4.4 07/19/2020   CL 103 07/19/2020   CREATININE 0.82 07/19/2020   BUN 11 07/19/2020   CO2 31 07/19/2020   TSH 0.60 04/02/2018   HGBA1C 5.5 07/19/2020    Assessment/Plan:  Chronic constipation -Discussed lifestyle modifications -Given handout -For continued or worsening symptoms consider follow-up with GI - Plan: polyethylene glycol powder (GLYCOLAX/MIRALAX) 17 GM/SCOOP powder  Grade I hemorrhoids -Likely 2/2 constipation -Hemoccult negative -Discussed supportive care -Anusol rectal cream as needed  - Plan: hydrocortisone (ANUSOL-HC) 2.5 % rectal cream  Left upper quadrant  abdominal pain -Likely 2/2 constipation -Last Pap 11/29/2018 normal.  Seen by OB/GYN Lavena Bullion, MD. referral not placed as patient recently seen by OB/GYN.  Patient to call and schedule follow-up if needed.  F/u as needed  Abbe [place], MD

## 2021-03-29 ENCOUNTER — Other Ambulatory Visit (HOSPITAL_COMMUNITY)
Admission: RE | Admit: 2021-03-29 | Discharge: 2021-03-29 | Disposition: A | Discharge status: Home / Self Care | Payer: Self-pay | Source: Ambulatory Visit | Attending: Family Medicine | Admitting: Family Medicine

## 2021-03-29 ENCOUNTER — Ambulatory Visit (INDEPENDENT_AMBULATORY_CARE_PROVIDER_SITE_OTHER): Payer: Self-pay | Admitting: Family Medicine

## 2021-03-29 ENCOUNTER — Other Ambulatory Visit: Payer: Self-pay

## 2021-03-29 ENCOUNTER — Encounter: Payer: Self-pay | Admitting: Family Medicine

## 2021-03-29 VITALS — BP 112/68 | HR 63 | Temp 97.9°F | Wt 121.6 lb

## 2021-03-29 DIAGNOSIS — R1084 Generalized abdominal pain: Secondary | ICD-10-CM

## 2021-03-29 DIAGNOSIS — K649 Unspecified hemorrhoids: Secondary | ICD-10-CM

## 2021-03-29 DIAGNOSIS — R3129 Other microscopic hematuria: Secondary | ICD-10-CM

## 2021-03-29 DIAGNOSIS — K5909 Other constipation: Secondary | ICD-10-CM

## 2021-03-29 DIAGNOSIS — N898 Other specified noninflammatory disorders of vagina: Secondary | ICD-10-CM

## 2021-03-29 DIAGNOSIS — R102 Pelvic and perineal pain: Secondary | ICD-10-CM

## 2021-03-29 DIAGNOSIS — K219 Gastro-esophageal reflux disease without esophagitis: Secondary | ICD-10-CM

## 2021-03-29 LAB — CBC WITH DIFFERENTIAL/PLATELET
Basophils Absolute: 0 10*3/uL (ref 0.0–0.1)
Basophils Relative: 0.7 % (ref 0.0–3.0)
Eosinophils Absolute: 0.1 10*3/uL (ref 0.0–0.7)
Eosinophils Relative: 2.4 % (ref 0.0–5.0)
HCT: 39.2 % (ref 36.0–46.0)
Hemoglobin: 13.1 g/dL (ref 12.0–15.0)
Lymphocytes Relative: 38.2 % (ref 12.0–46.0)
Lymphs Abs: 1.4 10*3/uL (ref 0.7–4.0)
MCHC: 33.3 g/dL (ref 30.0–36.0)
MCV: 85.7 fl (ref 78.0–100.0)
Monocytes Absolute: 0.3 10*3/uL (ref 0.1–1.0)
Monocytes Relative: 8.5 % (ref 3.0–12.0)
Neutro Abs: 1.8 10*3/uL (ref 1.4–7.7)
Neutrophils Relative %: 50.2 % (ref 43.0–77.0)
Platelets: 273 10*3/uL (ref 150.0–400.0)
RBC: 4.58 Mil/uL (ref 3.87–5.11)
RDW: 13.1 % (ref 11.5–15.5)
WBC: 3.6 10*3/uL — ABNORMAL LOW (ref 4.0–10.5)

## 2021-03-29 LAB — POCT URINALYSIS DIPSTICK
Bilirubin, UA: NEGATIVE
Glucose, UA: NEGATIVE
Ketones, UA: NEGATIVE
Leukocytes, UA: NEGATIVE
Nitrite, UA: NEGATIVE
Protein, UA: NEGATIVE
Spec Grav, UA: 1.025 (ref 1.010–1.025)
Urobilinogen, UA: NEGATIVE E.U./dL — AB
pH, UA: 6 (ref 5.0–8.0)

## 2021-03-29 LAB — COMPREHENSIVE METABOLIC PANEL
ALT: 15 U/L (ref 0–35)
AST: 21 U/L (ref 0–37)
Albumin: 4.3 g/dL (ref 3.5–5.2)
Alkaline Phosphatase: 120 U/L — ABNORMAL HIGH (ref 39–117)
BUN: 16 mg/dL (ref 6–23)
CO2: 30 mEq/L (ref 19–32)
Calcium: 9.6 mg/dL (ref 8.4–10.5)
Chloride: 102 mEq/L (ref 96–112)
Creatinine, Ser: 0.82 mg/dL (ref 0.40–1.20)
GFR: 81.94 mL/min (ref >60.00)
Glucose, Bld: 76 mg/dL (ref 70–99)
Potassium: 4.2 mEq/L (ref 3.5–5.1)
Sodium: 139 mEq/L (ref 135–145)
Total Bilirubin: 0.4 mg/dL (ref 0.2–1.2)
Total Protein: 7.3 g/dL (ref 6.0–8.3)

## 2021-03-29 LAB — CERVICOVAGINAL ANCILLARY ONLY
Bacterial Vaginitis (gardnerella): NEGATIVE
Candida Glabrata: NEGATIVE
Candida Vaginitis: NEGATIVE
Chlamydia: NEGATIVE
Comment: NEGATIVE
Comment: NEGATIVE
Comment: NEGATIVE
Comment: NEGATIVE
Comment: NEGATIVE
Comment: NORMAL
Neisseria Gonorrhea: NEGATIVE
Trichomonas: NEGATIVE

## 2021-03-29 LAB — HEMOGLOBIN A1C: Hgb A1c MFr Bld: 5.9 % (ref 4.6–6.5)

## 2021-03-29 LAB — LIPASE: Lipase: 67 U/L — ABNORMAL HIGH (ref 11.0–59.0)

## 2021-03-29 MED ORDER — POLYETHYLENE GLYCOL 3350 17 GM/SCOOP PO POWD: 17.0000 g | Freq: Every day | ORAL | 5 refills | Status: DC

## 2021-03-29 MED ORDER — OMEPRAZOLE 40 MG PO CPDR: 40.0000 mg | DELAYED_RELEASE_CAPSULE | Freq: Every day | ORAL | 3 refills | Status: DC

## 2021-03-29 NOTE — Progress Notes (Signed)
Subjective:    Patient ID: Maria Garrison, female    DOB: 05-10-1966, 55 y.o.   MRN: [id number]  Chief Complaint  Patient presents with   Constipation    Been taking med prescribed  [place] interpreter present.  HPI Patient was seen today for f/u on constipation. Notes improvement with miralax daily.  Not having issues with hemorrhoids with regular BMs.  Having 1 BM per day or every other day.  Endorses acid taste in mouth and burning/irritation in chest, mostly at night after eating.  Having dinner around 8 or 9, then getting into bed 1 hr later.  Still having LLQ pain that radiates into L groin/vagina.  Notes constant pressure/pain in vagina. Seen by OB/Gyn and GI. CT abd/pelvis done 12/22/19.  Past Medical History:  Diagnosis Date   Abdominal pain in female    For 3-4 years   COVID-19 10/11/2019   Heart murmur    Microscopic hematuria 2021   Spinal stenosis    Urinary incontinence     No Known Allergies  ROS General: Denies fever, chills, night sweats, changes in weight, changes in appetite HEENT: Denies headaches, ear pain, changes in vision, rhinorrhea, sore throat CV: Denies CP, palpitations, SOB, orthopnea Pulm: Denies SOB, cough, wheezing GI: Denies abdominal pain, nausea, vomiting, diarrhea, +constipation, acid reflux GU: Denies dysuria, hematuria, frequency, vaginal discharge + vaginal pain/pressure Msk: Denies muscle cramps, joint pains Neuro: Denies weakness, numbness, tingling Skin: Denies rashes, bruising Psych: Denies depression, anxiety, hallucinations    Objective:    Blood pressure 112/68, pulse 63, temperature 97.9 F (36.6 C), temperature source Oral, weight 121 lb 9.6 oz (55.2 kg), SpO2 98 %.  Gen. Pleasant, well-nourished, in no distress, normal affect   HEENT: [place]/AT, face symmetric, conjunctiva clear, no scleral icterus, PERRLA, EOMI, nares patent without drainage Lungs: no accessory muscle use, CTAB, no wheezes or rales Cardiovascular: RRR, no m/r/g,  no peripheral edema Abdomen: BS present, soft with palpable stool in L lateral abd, mild TTP in all four quadrants, ND, no hepatosplenomegaly. Musculoskeletal: No deformities, no cyanosis or clubbing, normal tone Neuro:  A&Ox3, CN II-XII intact, normal gait Skin:  Warm, no lesions/ rash   Wt Readings from Last 3 Encounters:  03/29/21 121 lb 9.6 oz (55.2 kg)  02/12/21 125 lb 6.4 oz (56.9 kg)  07/19/20 124 lb 3.2 oz (56.3 kg)    Lab Results  Component Value Date   WBC 3.5 (L) 07/19/2020   HGB 12.9 07/19/2020   HCT 40.1 07/19/2020   PLT 280 07/19/2020   GLUCOSE 81 07/19/2020   CHOL 216 (H) 07/19/2020   TRIG 92 07/19/2020   HDL 68 07/19/2020   LDLCALC 128 (H) 07/19/2020   ALT 17 07/19/2020   AST 21 07/19/2020   NA 140 07/19/2020   K 4.4 07/19/2020   CL 103 07/19/2020   CREATININE 0.82 07/19/2020   BUN 11 07/19/2020   CO2 31 07/19/2020   TSH 0.60 04/02/2018   HGBA1C 5.5 07/19/2020    Assessment/Plan:  Chronic constipation  -continue daily bowel regimen. Will increase Miralax to BID x next few days for clean out, then return to daily use. -continue increasing water and fluid intake -low FODMAP diet -f/u with GI prn - Plan: POCT urinalysis dipstick, Lipase, polyethylene glycol powder (GLYCOLAX/MIRALAX) 17 GM/SCOOP powder  Gastroesophageal reflux disease, unspecified whether esophagitis present  -avoid foods known to cause problems -Given handout -will start trial of omeprazole daily.   Previously tried protonix - Plan: CMP, CBC with Differential/Platelet, omeprazole (  PRILOSEC) 40 MG capsule  Hemorrhoids, unspecified hemorrhoid type -resolved -continue daily bowel regimen -OTC hemorrhoid cream or wipes prn  Vaginal irritation  - Plan: Cervicovaginal ancillary only, POCT urinalysis dipstick  Vaginal pain  -continue f/u with OB/Gyn - Plan: Cervicovaginal ancillary only  Generalized abdominal pain  -CT abd and pelvis with contrast 12/22/19 with small L renal cyst,  otherwise negative without acute findings or other significant abnormality. -discussed chronic constipation likely contributing - Plan: CMP, CBC with Differential/Platelet, Lipase, Hemoglobin A1c, Ambulatory referral to Urology, US Abdomen Limited RUQ (LIVER/GB)  Microscopic hematuria  -UA with 2+ RBCS - Plan: Ambulatory referral to Urology  F/u prn in 1 month  Abbe Amsterdam, MD

## 2021-04-14 ENCOUNTER — Encounter: Payer: Self-pay | Admitting: Family Medicine

## 2022-04-01 ENCOUNTER — Telehealth: Payer: Self-pay | Admitting: Family Medicine

## 2022-04-01 ENCOUNTER — Ambulatory Visit: Payer: Self-pay | Admitting: Family Medicine

## 2022-04-23 NOTE — Telephone Encounter (Signed)
Error. Please disregard

## 2022-04-29 ENCOUNTER — Ambulatory Visit: Payer: BC Managed Care – PPO | Admitting: Family Medicine

## 2022-04-29 VITALS — BP 120/72 | HR 61 | Temp 97.6°F | Wt 130.4 lb

## 2022-04-29 DIAGNOSIS — R1030 Lower abdominal pain, unspecified: Secondary | ICD-10-CM

## 2022-04-29 DIAGNOSIS — R350 Frequency of micturition: Secondary | ICD-10-CM

## 2022-04-29 DIAGNOSIS — R319 Hematuria, unspecified: Secondary | ICD-10-CM | POA: Diagnosis not present

## 2022-04-29 DIAGNOSIS — R109 Unspecified abdominal pain: Secondary | ICD-10-CM | POA: Diagnosis not present

## 2022-04-29 DIAGNOSIS — R300 Dysuria: Secondary | ICD-10-CM | POA: Diagnosis not present

## 2022-04-29 LAB — POCT URINALYSIS DIPSTICK
Bilirubin, UA: NEGATIVE
Blood, UA: POSITIVE
Glucose, UA: NEGATIVE
Ketones, UA: NEGATIVE
Leukocytes, UA: NEGATIVE
Nitrite, UA: NEGATIVE
Protein, UA: NEGATIVE
Spec Grav, UA: 1.02 (ref 1.010–1.025)
Urobilinogen, UA: 0.2 E.U./dL
pH, UA: 6 (ref 5.0–8.0)

## 2022-04-29 LAB — URINALYSIS, ROUTINE W REFLEX MICROSCOPIC
Bilirubin Urine: NEGATIVE
Ketones, ur: NEGATIVE
Leukocytes,Ua: NEGATIVE
Nitrite: NEGATIVE
Specific Gravity, Urine: 1.015 (ref 1.000–1.030)
Total Protein, Urine: NEGATIVE
Urine Glucose: NEGATIVE
Urobilinogen, UA: 0.2 (ref 0.0–1.0)
pH: 5.5 (ref 5.0–8.0)

## 2022-04-29 LAB — HEMOGLOBIN A1C: Hgb A1c MFr Bld: 5.8 % (ref 4.6–6.5)

## 2022-04-29 LAB — COMPREHENSIVE METABOLIC PANEL
ALT: 23 U/L (ref 0–35)
AST: 27 U/L (ref 0–37)
Albumin: 4.4 g/dL (ref 3.5–5.2)
Alkaline Phosphatase: 103 U/L (ref 39–117)
BUN: 16 mg/dL (ref 6–23)
CO2: 31 mEq/L (ref 19–32)
Calcium: 9.6 mg/dL (ref 8.4–10.5)
Chloride: 103 mEq/L (ref 96–112)
Creatinine, Ser: 0.85 mg/dL (ref 0.40–1.20)
GFR: 77.88 mL/min (ref >60.00)
Glucose, Bld: 85 mg/dL (ref 70–99)
Potassium: 4.7 mEq/L (ref 3.5–5.1)
Sodium: 138 mEq/L (ref 135–145)
Total Bilirubin: 0.5 mg/dL (ref 0.2–1.2)
Total Protein: 7.5 g/dL (ref 6.0–8.3)

## 2022-04-29 LAB — CBC WITH DIFFERENTIAL/PLATELET
Basophils Absolute: 0 10*3/uL (ref 0.0–0.1)
Basophils Relative: 0.5 % (ref 0.0–3.0)
Eosinophils Absolute: 0.1 10*3/uL (ref 0.0–0.7)
Eosinophils Relative: 1.5 % (ref 0.0–5.0)
HCT: 39.7 % (ref 36.0–46.0)
Hemoglobin: 12.8 g/dL (ref 12.0–15.0)
Lymphocytes Relative: 29.2 % (ref 12.0–46.0)
Lymphs Abs: 1.5 10*3/uL (ref 0.7–4.0)
MCHC: 32.3 g/dL (ref 30.0–36.0)
MCV: 88.3 fl (ref 78.0–100.0)
Monocytes Absolute: 0.4 10*3/uL (ref 0.1–1.0)
Monocytes Relative: 9 % (ref 3.0–12.0)
Neutro Abs: 3 10*3/uL (ref 1.4–7.7)
Neutrophils Relative %: 59.8 % (ref 43.0–77.0)
Platelets: 302 10*3/uL (ref 150.0–400.0)
RBC: 4.5 Mil/uL (ref 3.87–5.11)
RDW: 13.3 % (ref 11.5–15.5)
WBC: 5 10*3/uL (ref 4.0–10.5)

## 2022-04-29 MED ORDER — SULFAMETHOXAZOLE-TRIMETHOPRIM 800-160 MG PO TABS: 1.0000 | ORAL_TABLET | Freq: Two times a day (BID) | ORAL | 0 refills | Status: AC

## 2022-04-29 NOTE — Progress Notes (Signed)
Subjective:    Patient ID: Maria Garrison, female    DOB: [birth year], 56 y.o.   MRN: [id number]  Chief Complaint  Patient presents with   Back Pain    Patient accompany with daughter, Efraim Kaufmann. C/o of back pain on both sides. More on right side. Been going on for 4 years.   Anorexia    Patient c/o of loss of appetite.    Constipation  Pt accompanied by her daughter Efraim Kaufmann.  [place] interpreter not present.  HPI Patient is a 56 yo female with pmh sig for chronic abd/pelvic pain, chronic constipation, GERD, spinal stenosis who was seen today for ongoing concern.  Patient endorses bilateral flank pain right greater than left x1 month.  R flank heaviness/burning, LLQ pain, and suprapubic pain.  Patient notes increased urinary frequency (6-8x /d), dysuria, change in color and odor of urine, and decreased volume produced with urination.  Changes in volume and frequency and urine started a few days ago.  Pt endorses decreased appetite, hot flashes-LMP greater than 1 year ago.  Denies nausea, vomiting, fever, chills.  Drinking 6 bottles of water per day.  Past referral made to urology however patient missed phone call to schedule appointment.  Past Medical History:  Diagnosis Date   Abdominal pain in female    For 3-4 years   COVID-19 10/11/2019   Heart murmur    Microscopic hematuria 2021   Spinal stenosis    Urinary incontinence     No Known Allergies  ROS General: Denies fever, chills, night sweats, changes in weight + changes in appetite HEENT: Denies headaches, ear pain, changes in vision, rhinorrhea, sore throat CV: Denies CP, palpitations, SOB, orthopnea Pulm: Denies SOB, cough, wheezing GI: Denies nausea, vomiting, diarrhea + constipation, suprapubic pain, LLQ pain GU: Denies hematuria, vaginal discharge + dysuria, frequency, malodorous urine Msk: Denies muscle cramps, joint pains + bilateral flank pain Neuro: Denies weakness, numbness, tingling Skin: Denies rashes,  bruising Psych: Denies depression, anxiety, hallucinations     Objective:    Blood pressure 120/72, pulse 61, temperature 97.6 F (36.4 C), temperature source Oral, weight 130 lb 6.4 oz (59.1 kg), last menstrual period 12/19/2019, SpO2 98 %.  Gen. Pleasant, well-nourished, in no distress, normal affect   HEENT: [place]/AT, face symmetric, conjunctiva clear, no scleral icterus, PERRLA, EOMI, nares patent without drainage Lungs: no accessory muscle use, CTAB, no wheezes or rales Cardiovascular: RRR, no m/r/g, no peripheral edema Abdomen: BS present, soft, NT/ND, no hepatosplenomegaly. Musculoskeletal: Left lateral lower back/flank with visible edema, no erythema.  No CVA tenderness.  No deformities, no cyanosis or clubbing, normal tone Neuro:  A&Ox3, CN II-XII intact, normal gait Skin:  Warm, no lesions/ rash   Wt Readings from Last 3 Encounters:  04/29/22 130 lb 6.4 oz (59.1 kg)  03/29/21 121 lb 9.6 oz (55.2 kg)  02/12/21 125 lb 6.4 oz (56.9 kg)    Lab Results  Component Value Date   WBC 3.6 (L) 03/29/2021   HGB 13.1 03/29/2021   HCT 39.2 03/29/2021   PLT 273.0 03/29/2021   GLUCOSE 76 03/29/2021   CHOL 216 (H) 07/19/2020   TRIG 92 07/19/2020   HDL 68 07/19/2020   LDLCALC 128 (H) 07/19/2020   ALT 15 03/29/2021   AST 21 03/29/2021   NA 139 03/29/2021   K 4.2 03/29/2021   CL 102 03/29/2021   CREATININE 0.82 03/29/2021   BUN 16 03/29/2021   CO2 30 03/29/2021   TSH 0.60 04/02/2018   HGBA1C 5.9 03/29/2021  Assessment/Plan:  Bilateral flank pain -Discussed possible causes including UTI, pyelonephritis, renal calculi, referred back pain, chronic constipation -We will obtain labs and imaging -Start Bactrim DS -Referral placed to urology for follow-up -Given strict precautions for continued or worsening symptoms  - Plan: Culture, Urine, POC Urinalysis Dipstick, CMP, CBC with Differential/Platelet, US Renal, sulfamethoxazole-trimethoprim (BACTRIM DS) 800-160 MG tablet,  Ambulatory referral to Urology  Dysuria -UA negative for leuks or nitrites -Obtain urine culture - Plan: sulfamethoxazole-trimethoprim (BACTRIM DS) 800-160 MG tablet, Urinalysis with Reflex Microscopic  Lower abdominal pain -Discussed possible causes including UTI, constipation, renal calculi, appendicitis though less likely  - Plan: CMP, CBC with Differential/Platelet, Ambulatory referral to Urology  Hematuria, unspecified type -UA with positive RBCs - Plan: CBC with Differential/Platelet, US Renal, Ambulatory referral to Urology, Urinalysis with Reflex Microscopic  Urinary frequency  - Plan: Hemoglobin A1c  F/u as needed  Abbe Amsterdam, MD

## 2022-04-29 NOTE — Patient Instructions (Signed)
A prescription for Bactrim, an antibiotic was sent to your pharmacy.  You are to start taking 1 pill twice a day for the next 10 days.  A referral for the urologist was placed as well as an order for an ultrasound of your kidney.  You should expect a phone call about scheduling these appointments.  Asked that they contact Melissa.  If you develop fever, inability to urinate, increased symptoms proceed to nearest emergency department for further evaluation

## 2022-04-30 LAB — URINE CULTURE
MICRO NUMBER:: 13685585
Result:: NO GROWTH
SPECIMEN QUALITY:: ADEQUATE

## 2022-05-06 ENCOUNTER — Encounter: Payer: Self-pay | Admitting: Family Medicine

## 2022-05-06 ENCOUNTER — Other Ambulatory Visit: Payer: BC Managed Care – PPO

## 2022-05-08 ENCOUNTER — Ambulatory Visit
Admission: RE | Admit: 2022-05-08 | Discharge: 2022-05-08 | Disposition: A | Discharge status: Home / Self Care | Payer: BC Managed Care – PPO | Source: Ambulatory Visit | Attending: Family Medicine | Admitting: Family Medicine

## 2022-05-08 DIAGNOSIS — R319 Hematuria, unspecified: Secondary | ICD-10-CM

## 2022-05-08 DIAGNOSIS — R109 Unspecified abdominal pain: Secondary | ICD-10-CM

## 2022-05-08 DIAGNOSIS — N281 Cyst of kidney, acquired: Secondary | ICD-10-CM | POA: Diagnosis not present

## 2022-06-21 DIAGNOSIS — Z23 Encounter for immunization: Secondary | ICD-10-CM | POA: Diagnosis not present

## 2022-08-09 ENCOUNTER — Telehealth: Payer: Self-pay

## 2022-08-09 NOTE — Telephone Encounter (Signed)
Caller states he is calling for his mom. c/o arthritis Pain in knee and elbow. Both joints swollen.  08/09/2022 3:57:03 PM Clinical Call Eugenio Hoes, RN, Jenny Reichmann  Comments User: Baird Cancer, RN Date/Time Eilene Ghazi Time): 08/09/2022 3:56:51 PM Caller states the he is not with patient currently. Suggested that we could connect patient on the line if needed. Caller states that patient is at work right now and unable to speak. Advised to call back when able to properly assess patient.  08/09/22 1521 - Offered son to bring mom in to discuss issue. Appt scheduled for 08/12/22  Of note: pt has not had CPE since 2021. Offered to schedule, son declines.

## 2022-08-12 ENCOUNTER — Ambulatory Visit: Payer: BC Managed Care – PPO | Admitting: Family Medicine

## 2022-08-12 VITALS — BP 108/64 | HR 72 | Temp 97.7°F | Wt 127.6 lb

## 2022-08-12 DIAGNOSIS — R238 Other skin changes: Secondary | ICD-10-CM | POA: Diagnosis not present

## 2022-08-12 DIAGNOSIS — L309 Dermatitis, unspecified: Secondary | ICD-10-CM | POA: Diagnosis not present

## 2022-08-12 DIAGNOSIS — R202 Paresthesia of skin: Secondary | ICD-10-CM

## 2022-08-12 DIAGNOSIS — L299 Pruritus, unspecified: Secondary | ICD-10-CM

## 2022-08-12 DIAGNOSIS — M2550 Pain in unspecified joint: Secondary | ICD-10-CM

## 2022-08-12 DIAGNOSIS — G8929 Other chronic pain: Secondary | ICD-10-CM

## 2022-08-12 DIAGNOSIS — R600 Localized edema: Secondary | ICD-10-CM

## 2022-08-12 NOTE — Progress Notes (Unsigned)
Subjective:    Patient ID: Maria Garrison, female    DOB: 09/25/1966, 56 y.o.   MRN: [id number]  Chief Complaint  Patient presents with   Edema    Ankles, top of rt and left feet, side of right foot both sides of foot,  right arm to leg gets numb, pain in back. Been going on about 7-8 months. Also soles of feet are painful, not able to describe further, just pain in feet.  Neck pain, hard to sleep, feels like laying on rock when laying on pillow. knee pain, left knee looks/feels swollen.when she stands near high voltage units or outlets, he body aches.  Only rubbed ben gay on feet, just gives hot sensation, then the hotness goes away not pain  Pt accompanied by [place] interpreter, [employer].  HPI Patient was seen today for ongoing concerns.  Pt with pain in elbows, hands, neck, ankles, hips, low back x 7 months.  Pt also with edema in hand at R 2nd mcp joint.  Massaging hand may help, but states that is in the mind.  Denies family h/o autoimmune dz or RA.  Patient tried Radiation protection practitioner.  Patient endorses numbness, tingling in right hand at times going up right forearm.  Patient endorses doing repetitive motions during the day.  Pt with 2 bumps on  back of neck present x 1 yr.  States area is pruritic but endorses more of a "internal itch".  Past Medical History:  Diagnosis Date   Abdominal pain in female    For 3-4 years   COVID-19 10/11/2019   Heart murmur    Microscopic hematuria 2021   Spinal stenosis    Urinary incontinence     No Known Allergies  ROS General: Denies fever, chills, night sweats, changes in weight, changes in appetite +"internal itch" HEENT: Denies headaches, ear pain, changes in vision, rhinorrhea, sore throat CV: Denies CP, palpitations, SOB, orthopnea Pulm: Denies SOB, cough, wheezing GI: Denies abdominal pain, nausea, vomiting, diarrhea, constipation GU: Denies dysuria, hematuria, frequency, vaginal discharge Msk: Denies muscle cramps +joint pains Neuro: Denies  weakness  +numbness, tingling in R hand and forearm Skin: Denies rashes, bruising +2 bumps on upper back Psych: Denies depression, anxiety, hallucinations     Objective:    Blood pressure 108/64, pulse 72, temperature 97.7 F (36.5 C), temperature source Oral, weight 127 lb 9.6 oz (57.9 kg), last menstrual period 12/19/2019, SpO2 98 %.  Gen. Pleasant, well-nourished, in no distress, normal affect  HEENT: [place]/AT, face symmetric, conjunctiva clear, no scleral icterus, PERRLA, EOMI, nares patent without drainage, pharynx without erythema or exudate. Neck: No JVD, no thyromegaly, no carotid bruits Lungs: no accessory muscle use, CTAB, no wheezes or rales Cardiovascular: RRR, no m/r/g, no peripheral edema Musculoskeletal: negative tinnel's and phalen's.  Tapping on anterior R wrist causes pain in hand/lateral wrist.  No symptoms with tapping on epicondyles.  No deformities, no cyanosis or clubbing, normal tone Neuro:  A&Ox3, CN II-XII intact, normal gait Skin:  Warm, no lesions/ rash.  A raised skin colored papule with central punctum and a smaller raised papule on posterior L shoulder proximal to neck.  Mildly dry skin with excoriations on L hand   Wt Readings from Last 3 Encounters:  08/12/22 127 lb 9.6 oz (57.9 kg)  04/29/22 130 lb 6.4 oz (59.1 kg)  03/29/21 121 lb 9.6 oz (55.2 kg)    Lab Results  Component Value Date   WBC 5.0 04/29/2022   HGB 12.8 04/29/2022   HCT 39.7  04/29/2022   PLT 302.0 04/29/2022   GLUCOSE 85 04/29/2022   CHOL 216 (H) 07/19/2020   TRIG 92 07/19/2020   HDL 68 07/19/2020   LDLCALC 128 (H) 07/19/2020   ALT 23 04/29/2022   AST 27 04/29/2022   NA 138 04/29/2022   K 4.7 04/29/2022   CL 103 04/29/2022   CREATININE 0.85 04/29/2022   BUN 16 04/29/2022   CO2 31 04/29/2022   TSH 0.60 04/02/2018   HGBA1C 5.8 04/29/2022    Assessment/Plan:  Chronic pain of multiple joints -Discussed supportive care with topical analgesics, Tylenol or NSAIDs as needed,  stretching, exercises. -Advised symptoms may be caused by autoimmune disorder, arthritis, etc. -Will obtain labs and x-ray of right hand later this week as clinic lab and x-ray closed during time of patient's visit.  - Plan: DG Hand Complete Right, CBC with Differential/Platelet, Rheumatoid Factor, Lupus (SLE) Analysis, Sedimentation Rate  Eczema, unspecified type -Discussed using a good moisturizing lotion, avoiding taking hot showers, wearing gloves when washing dishes or submerging hands in water or cleansers.  Pruritus  -Consider turmeric, OTC antihistamine - Plan: CBC with Differential/Platelet, CMP, TSH  Papule of skin  Edema of hand  - Plan: DG Hand Complete Right, CBC with Differential/Platelet, Rheumatoid Factor, Lupus (SLE) Analysis, Sedimentation Rate, CMP  Right hand paresthesia -Discussed symptoms likely 2/2 compression such as carpal tunnel -Discussed supportive care including topical analgesics, NSAIDs or Tylenol, ice, heat, rest, compression/wrist splints. - Plan: DG Hand Complete Right, CBC with Differential/Platelet, Vitamin B12, CMP  F/u later this wk for lab appt for labs and xray as were closed during time of visit.  Grier Mitts, MD

## 2022-08-14 ENCOUNTER — Encounter: Payer: Self-pay | Admitting: Family Medicine

## 2022-08-15 ENCOUNTER — Other Ambulatory Visit (INDEPENDENT_AMBULATORY_CARE_PROVIDER_SITE_OTHER): Payer: BC Managed Care – PPO

## 2022-08-15 ENCOUNTER — Ambulatory Visit: Payer: BC Managed Care – PPO | Admitting: Family Medicine

## 2022-08-15 ENCOUNTER — Ambulatory Visit (INDEPENDENT_AMBULATORY_CARE_PROVIDER_SITE_OTHER): Payer: BC Managed Care – PPO

## 2022-08-15 DIAGNOSIS — L299 Pruritus, unspecified: Secondary | ICD-10-CM

## 2022-08-15 DIAGNOSIS — G8929 Other chronic pain: Secondary | ICD-10-CM

## 2022-08-15 DIAGNOSIS — M79641 Pain in right hand: Secondary | ICD-10-CM | POA: Diagnosis not present

## 2022-08-15 DIAGNOSIS — R600 Localized edema: Secondary | ICD-10-CM | POA: Diagnosis not present

## 2022-08-15 DIAGNOSIS — R202 Paresthesia of skin: Secondary | ICD-10-CM

## 2022-08-15 DIAGNOSIS — M2550 Pain in unspecified joint: Secondary | ICD-10-CM | POA: Diagnosis not present

## 2022-08-16 LAB — COMPREHENSIVE METABOLIC PANEL
ALT: 16 U/L (ref 0–35)
AST: 23 U/L (ref 0–37)
Albumin: 4.3 g/dL (ref 3.5–5.2)
Alkaline Phosphatase: 116 U/L (ref 39–117)
BUN: 18 mg/dL (ref 6–23)
CO2: 31 mEq/L (ref 19–32)
Calcium: 9.3 mg/dL (ref 8.4–10.5)
Chloride: 103 mEq/L (ref 96–112)
Creatinine, Ser: 0.9 mg/dL (ref 0.40–1.20)
GFR: 72.57 mL/min (ref >60.00)
Glucose, Bld: 72 mg/dL (ref 70–99)
Potassium: 4.1 mEq/L (ref 3.5–5.1)
Sodium: 139 mEq/L (ref 135–145)
Total Bilirubin: 0.2 mg/dL (ref 0.2–1.2)
Total Protein: 7.7 g/dL (ref 6.0–8.3)

## 2022-08-16 LAB — CBC WITH DIFFERENTIAL/PLATELET
Basophils Absolute: 0 10*3/uL (ref 0.0–0.1)
Basophils Relative: 1 % (ref 0.0–3.0)
Eosinophils Absolute: 0.1 10*3/uL (ref 0.0–0.7)
Eosinophils Relative: 1.6 % (ref 0.0–5.0)
HCT: 38.3 % (ref 36.0–46.0)
Hemoglobin: 12.5 g/dL (ref 12.0–15.0)
Lymphocytes Relative: 32.1 % (ref 12.0–46.0)
Lymphs Abs: 1.6 10*3/uL (ref 0.7–4.0)
MCHC: 32.8 g/dL (ref 30.0–36.0)
MCV: 86.3 fl (ref 78.0–100.0)
Monocytes Absolute: 0.5 10*3/uL (ref 0.1–1.0)
Monocytes Relative: 9 % (ref 3.0–12.0)
Neutro Abs: 2.8 10*3/uL (ref 1.4–7.7)
Neutrophils Relative %: 56.3 % (ref 43.0–77.0)
Platelets: 299 10*3/uL (ref 150.0–400.0)
RBC: 4.43 Mil/uL (ref 3.87–5.11)
RDW: 13.2 % (ref 11.5–15.5)
WBC: 5 10*3/uL (ref 4.0–10.5)

## 2022-08-16 LAB — TSH: TSH: 1.39 u[IU]/mL (ref 0.35–5.50)

## 2022-08-16 LAB — VITAMIN B12: Vitamin B-12: 599 pg/mL (ref 211–911)

## 2022-08-16 LAB — RHEUMATOID FACTOR: Rheumatoid fact SerPl-aCnc: <14 IU/mL (ref <14)

## 2022-08-16 LAB — SEDIMENTATION RATE: Sed Rate: 40 mm/hr — ABNORMAL HIGH (ref 0–30)

## 2022-08-21 LAB — LUPUS (SLE) ANALYSIS
Anti Nuclear Antibody (ANA): NEGATIVE
Anti-striation Abs: NEGATIVE
Complement C4, Serum: 33 mg/dL (ref 12–38)
ENA RNP Ab: <0.2 AI (ref 0.0–0.9)
ENA SM Ab Ser-aCnc: <0.2 AI (ref 0.0–0.9)
ENA SSA (RO) Ab: <0.2 AI (ref 0.0–0.9)
ENA SSB (LA) Ab: <0.2 AI (ref 0.0–0.9)
Mitochondrial Ab: <20 Units (ref 0.0–20.0)
Parietal Cell Ab: 2.6 Units (ref 0.0–20.0)
Scleroderma (Scl-70) (ENA) Antibody, IgG: <0.2 AI (ref 0.0–0.9)
Smooth Muscle Ab: 7 Units (ref 0–19)
Thyroperoxidase Ab SerPl-aCnc: 13 IU/mL (ref 0–34)
dsDNA Ab: 2 IU/mL (ref 0–9)

## 2022-10-18 ENCOUNTER — Ambulatory Visit: Payer: BC Managed Care – PPO | Admitting: Family Medicine

## 2022-10-18 ENCOUNTER — Encounter: Payer: Self-pay | Admitting: Family Medicine

## 2022-10-18 VITALS — BP 102/68 | HR 75 | Temp 98.5°F | Wt 128.4 lb

## 2022-10-18 DIAGNOSIS — M940 Chondrocostal junction syndrome [Tietze]: Secondary | ICD-10-CM | POA: Diagnosis not present

## 2022-10-18 DIAGNOSIS — K219 Gastro-esophageal reflux disease without esophagitis: Secondary | ICD-10-CM

## 2022-10-18 DIAGNOSIS — R1310 Dysphagia, unspecified: Secondary | ICD-10-CM

## 2022-10-18 MED ORDER — OMEPRAZOLE 40 MG PO CPDR: 40.0000 mg | DELAYED_RELEASE_CAPSULE | Freq: Every day | ORAL | 1 refills | Status: DC

## 2022-10-18 NOTE — Progress Notes (Signed)
Acute Office Visit  Subjective:     Patient ID: Maria Garrison, female    DOB: 12-06-1965, 57 y.o.   MRN: [id number]  Chief Complaint  Patient presents with   Chest Pain    Pt is accompanied by son. She reports she is here for chest pain going on for 2 months.   Patient accompanied by her son.  [place] interpreter joins visit.  HPI Patient is in today for ongoing concern.  Patient endorses chest pain, burning sensation, sour taste in mouth, pain in throat, hoarse voice, cough and pain below L rib cage.  Burning sensation with all foods.  Occasionally feels stuck when swallowing.  Out of omeprazole.  Symptoms improved when on medication.  Patient also notes intermittent pain of chest wall with palpation.  Still having bilateral hand pain.  Inquires about results of x-ray of right hand.   Review of Systems  Constitutional:  Negative for chills and fever.  HENT:  Positive for sore throat. Negative for congestion and sinus pain.   Respiratory:  Positive for sputum production.   Cardiovascular:  Positive for chest pain.  Gastrointestinal:  Positive for abdominal pain and heartburn. Negative for constipation, diarrhea, nausea and vomiting.  Genitourinary:  Negative for frequency and urgency.  Musculoskeletal:  Positive for joint pain.      Objective:    BP 102/68 (BP Location: Right Arm, Patient Position: Sitting, Cuff Size: Normal)   Pulse 75   Temp 98.5 F (36.9 C) (Oral)   Wt 128 lb 6.4 oz (58.2 kg)   LMP 12/19/2019 (Approximate)   SpO2 97%   BMI 25.08 kg/m    Physical Exam Constitutional:      Appearance: She is well-developed.  HENT:     Head: Normocephalic and atraumatic.  Cardiovascular:     Rate and Rhythm: Normal rate and regular rhythm.     Heart sounds: Normal heart sounds.  Pulmonary:     Effort: Pulmonary effort is normal.  Chest:     Chest wall: Tenderness present.  Abdominal:     General: Bowel sounds are normal.     Palpations: Abdomen is soft.   Skin:    General: Skin is warm and dry.  Neurological:     General: No focal deficit present.     Mental Status: She is alert and oriented to person, place, and time.     No results found for any visits on 10/18/22.      Assessment & Plan:   Problem List Items Addressed This Visit       Digestive   Gastroesophageal reflux disease   Relevant Medications   omeprazole (PRILOSEC) 40 MG capsule   Other Relevant Orders   Ambulatory referral to Gastroenterology   Other Visit Diagnoses     Costochondritis    -  Primary   Dysphagia, unspecified type           Meds ordered this encounter  Medications   omeprazole (PRILOSEC) 40 MG capsule    Sig: Take 1 capsule (40 mg total) by mouth daily.    Dispense:  90 capsule    Refill:  1   Restart omeprazole for current GERD symptoms.  Referral to gastroenterology for possible EGD to evaluate for possible hiatal hernia or esophageal stricture.  Tylenol, heat, other supportive care as needed for costochondritis.  Given strict precautions.  X-ray results of right hand reviewed with patient.  No degenerative changes noted.  Supportive care.  Return if symptoms worsen or fail  to improve.   On day of service, 35 minutes spent caring for this patient face-to-face, reviewing the chart, counseling and/or coordinating care for plan and treatment of diagnosis below.     Billie Ruddy, MD

## 2022-10-18 NOTE — Patient Instructions (Signed)
The x-ray you had done of your hand was actually negative for signs of degenerative changes.  The referral for the gastroenterologist was placed and you should expect a phone call about scheduling this appointment.

## 2022-11-02 DIAGNOSIS — F4320 Adjustment disorder, unspecified: Secondary | ICD-10-CM | POA: Diagnosis not present

## 2022-11-07 ENCOUNTER — Encounter: Payer: Self-pay | Admitting: Physician Assistant

## 2022-11-07 ENCOUNTER — Ambulatory Visit: Payer: BC Managed Care – PPO | Admitting: Nurse Practitioner

## 2022-11-07 NOTE — Progress Notes (Deleted)
Assessment    Patient profile:  Maria Garrison is a 57 y.o. year old female , known to Dr. Silverio Decamp with a past medical history of X.  See PMH / [place] for additional history.  Patient is referred by her PCP for GERD      Plan:        HPI:    Chief Complaint:   Patient saw her PCP on 10/18/2022 for evaluation of chest pain, ongoing for 2 months.  She also described hoarse voice, sour taste in her mouth and dysphagia.  She was out of PPI prescription so omeprazole 40 mg was started.    Interval history:   Previous Labs / Imaging::    Latest Ref Rng & Units 08/15/2022    3:55 PM 04/29/2022   10:22 AM 03/29/2021    9:14 AM  CBC  WBC 4.0 - 10.5 K/uL 5.0  5.0  3.6   Hemoglobin 12.0 - 15.0 g/dL 12.5  12.8  13.1   Hematocrit 36.0 - 46.0 % 38.3  39.7  39.2   Platelets 150.0 - 400.0 K/uL 299.0  302.0  273.0        Latest Ref Rng & Units 08/15/2022    3:55 PM 04/29/2022   10:22 AM 03/29/2021    9:14 AM  CMP  Glucose 70 - 99 mg/dL 72  85  76   BUN 6 - 23 mg/dL '18  16  16   '$ Creatinine 0.40 - 1.20 mg/dL 0.90  0.85  0.82   Sodium 135 - 145 mEq/L 139  138  139   Potassium 3.5 - 5.1 mEq/L 4.1  4.7  4.2   Chloride 96 - 112 mEq/L 103  103  102   CO2 19 - 32 mEq/L '31  31  30   '$ Calcium 8.4 - 10.5 mg/dL 9.3  9.6  9.6   Total Protein 6.0 - 8.3 g/dL 7.7  7.5  7.3   Total Bilirubin 0.2 - 1.2 mg/dL 0.2  0.5  0.4   Alkaline Phos 39 - 117 U/L 116  103  120   AST 0 - 37 U/L '23  27  21   '$ ALT 0 - 35 U/L '16  23  15       '$ Previous GI Evaluation  Colonoscopy May 2019 for rectal bleeding findings included mild diverticulosis in the sigmoid colon, descending colon and ascending colon.  Nonbleeding internal hemorrhoids colon   Past Medical History:  Diagnosis Date   Abdominal pain in female    For 3-4 years   COVID-19 10/11/2019   Heart murmur    Microscopic hematuria 2021   Spinal stenosis    Urinary incontinence    Past Surgical History:  Procedure Laterality Date   HEMORROIDECTOMY      Family History  Problem Relation Age of Onset   Other Mother        Lung Disease??   Cirrhosis Father    Colon cancer Neg Hx    Colon polyps Neg Hx    Esophageal cancer Neg Hx    Social History   Tobacco Use   Smoking status: Never   Smokeless tobacco: Never  Vaping Use   Vaping Use: Never used  Substance Use Topics   Alcohol use: No   Drug use: No   Current Outpatient Medications  Medication Sig Dispense Refill   AMBULATORY NON FORMULARY MEDICATION Nitroglycerin ointment 0.125% Use pea sized amount per rectum for two months three times daily (Patient not taking:  Reported on 02/12/2021) 30 g 0   docusate sodium (COLACE) 100 MG capsule Take 100 mg by mouth. (Patient not taking: Reported on 07/19/2020)     hydrocortisone (ANUSOL-HC) 2.5 % rectal cream Place 1 application rectally 2 (two) times daily. (Patient not taking: Reported on 03/29/2021) 28 g 1   ibuprofen (ADVIL) 200 MG tablet Take 200 mg by mouth every 6 (six) hours as needed. (Patient not taking: Reported on 07/19/2020)     omeprazole (PRILOSEC) 40 MG capsule Take 1 capsule (40 mg total) by mouth daily. 90 capsule 1   polyethylene glycol powder (GLYCOLAX/MIRALAX) 17 GM/SCOOP powder Take 17 g by mouth daily. (Patient not taking: Reported on 04/29/2022) 3350 g 5   Current Facility-Administered Medications  Medication Dose Route Frequency Provider Last Rate Last Admin   0.9 %  sodium chloride infusion  500 mL Intravenous Once Nandigam, Venia Minks, MD       No Known Allergies   Review of Systems: Positive for ***.  All other systems reviewed and negative except where noted in HPI.   Wt Readings from Last 3 Encounters:  10/18/22 128 lb 6.4 oz (58.2 kg)  08/12/22 127 lb 9.6 oz (57.9 kg)  04/29/22 130 lb 6.4 oz (59.1 kg)    Physical Exam   LMP 12/19/2019 (Approximate)  Constitutional:  Pleasant, generally well appearing ***female in no acute distress. Psychiatric:  Normal mood and affect. Behavior is normal. EENT:  Pupils normal.  Conjunctivae are normal. No scleral icterus. Neck supple.  Cardiovascular: Normal rate, regular rhythm.  Pulmonary/chest: Effort normal and breath sounds normal. No wheezing, rales or rhonchi. Abdominal: Soft, nondistended, nontender. Bowel sounds active throughout. There are no masses palpable. No hepatomegaly. Neurological: Alert and oriented to person place and time. Extremities: *** No edema Skin: Skin is warm and dry. No rashes noted.  Tye Savoy, NP  11/07/2022, 1:27 PM  Cc:  Referring Provider Billie Ruddy, MD

## 2022-12-08 NOTE — Progress Notes (Unsigned)
12/09/2022 Maria Garrison [id number] 06/12/1966  Referring provider: Billie Ruddy, MD Primary GI doctor: Dr. Silverio Decamp  ASSESSMENT AND PLAN:   Esophageal dysphagia/globus sensation with history of GERD Daughter treated with H. pylori years ago, very suspicious for possible H. pylori gastritis was able to schedule for endoscopy this coming Friday still not test for H. pylori outpatient. EGD with dilatation to evaluate for esophagitis, Barrett's esophagus, H. pylori gastritis, carcinoma. If EGD is negative we will plan for barium swallow to evaluate further Information provided about GERD lifestyle changes Increase Prilosec 40 mg to twice daily, take before food.  Constipation with history of abdominal pain chronic 02/24/2017 colonoscopy with diverticulosis, hemorrhoids recall 10 years Continue MiraLAX daily   Patient Care Team: Billie Ruddy, MD as PCP - General (Family Medicine)  HISTORY OF PRESENT ILLNESS: 57 y.o. [place] female with a past medical history of anal fissure and others listed below presents for evaluation of GERD/dysphagia.   02/24/2017 colonoscopy with excellent prep for rectal bleeding showed mild diverticulosis sigmoid descending and ascending colon.  Nonbleeding internal hemorrhoids recall 10 years 10/18/2022 office visit with primary care for GERD with associated dysphagia.  Daughter is here to help/translate.  Has had issues with GERD for years, getting progressively worse in the last several months.  She feels globus sensation in the morning upper esophagus, feels any food gets stuck, no issues with liquids.  Has epigastric AB pain worse with eating.  She describes one episode of vomiting a month ago, she denies nausea with it, had food get stuck and regurgitated it up.  Denies melena. Has some mild left lower quadrant AB pain, can have BM once every day.  She has some pain on her chest and some  She feels she has lost some weight but per our records  no weight loss.  She is on prilosec 40 mg before food once daily for 3 weeks, helping some with her symptoms.  No NSAIDS, no ETOH. No supplements over the counter.  Has never had EGD.  Daughter with history of H pylori years ago.  Lab show no anemia.   Wt Readings from Last 3 Encounters:  12/09/22 127 lb 8 oz (57.8 kg)  10/18/22 128 lb 6.4 oz (58.2 kg)  08/12/22 127 lb 9.6 oz (57.9 kg)     She  reports that she has never smoked. She has never used smokeless tobacco. She reports that she does not drink alcohol and does not use drugs.  RELEVANT LABS AND IMAGING: CBC    Component Value Date/Time   WBC 5.0 08/15/2022 1555   RBC 4.43 08/15/2022 1555   HGB 12.5 08/15/2022 1555   HGB 13.5 09/29/2019 0917   HCT 38.3 08/15/2022 1555   HCT 40.8 09/29/2019 0917   PLT 299.0 08/15/2022 1555   PLT 326 09/29/2019 0917   MCV 86.3 08/15/2022 1555   MCV 87 09/29/2019 0917   MCH 28.6 07/19/2020 1040   MCHC 32.8 08/15/2022 1555   RDW 13.2 08/15/2022 1555   RDW 13.2 09/29/2019 0917   LYMPHSABS 1.6 08/15/2022 1555   LYMPHSABS 1.6 03/13/2017 1127   MONOABS 0.5 08/15/2022 1555   EOSABS 0.1 08/15/2022 1555   EOSABS 0.1 03/13/2017 1127   BASOSABS 0.0 08/15/2022 1555   BASOSABS 0.0 03/13/2017 1127   Recent Labs    04/29/22 1022 08/15/22 1555  HGB 12.8 12.5     CMP     Component Value Date/Time   NA 139 08/15/2022 1555  NA 142 09/29/2019 0917   K 4.1 08/15/2022 1555   CL 103 08/15/2022 1555   CO2 31 08/15/2022 1555   GLUCOSE 72 08/15/2022 1555   BUN 18 08/15/2022 1555   BUN 13 09/29/2019 0917   CREATININE 0.90 08/15/2022 1555   CREATININE 0.82 07/19/2020 1040   CALCIUM 9.3 08/15/2022 1555   PROT 7.7 08/15/2022 1555   PROT 7.3 09/29/2019 0917   ALBUMIN 4.3 08/15/2022 1555   ALBUMIN 4.5 09/29/2019 0917   AST 23 08/15/2022 1555   ALT 16 08/15/2022 1555   ALKPHOS 116 08/15/2022 1555   BILITOT 0.2 08/15/2022 1555   BILITOT 0.3 09/29/2019 0917   GFRNONAA 82 07/19/2020 1040    GFRAA 95 07/19/2020 1040      Latest Ref Rng & Units 08/15/2022    3:55 PM 04/29/2022   10:22 AM 03/29/2021    9:14 AM  Hepatic Function  Total Protein 6.0 - 8.3 g/dL 7.7  7.5  7.3   Albumin 3.5 - 5.2 g/dL 4.3  4.4  4.3   AST 0 - 37 U/L '23  27  21   '$ ALT 0 - 35 U/L '16  23  15   '$ Alk Phosphatase 39 - 117 U/L 116  103  120   Total Bilirubin 0.2 - 1.2 mg/dL 0.2  0.5  0.4       Current Medications:        Current Outpatient Medications (Other):    omeprazole (PRILOSEC) 40 MG capsule, Take 1 capsule (40 mg total) by mouth 2 (two) times daily before a meal.  Medical History:  Past Medical History:  Diagnosis Date   Abdominal pain in female    For 3-4 years   COVID-19 10/11/2019   Heart murmur    Microscopic hematuria 2021   Spinal stenosis    Urinary incontinence    Allergies: No Known Allergies   Surgical History:  She  has a past surgical history that includes Hemorroidectomy and Colonoscopy. Family History:  Her family history includes Cirrhosis in her father; Other in her mother.  REVIEW OF SYSTEMS  : All other systems reviewed and negative except where noted in the History of Present Illness.  PHYSICAL EXAM: BP 110/72   Pulse 62   Ht 5' (1.524 m)   Wt 127 lb 8 oz (57.8 kg)   LMP 12/19/2019 (Approximate)   SpO2 98%   BMI 24.90 kg/m  General Appearance: Well nourished, in no apparent distress. Head:   Normocephalic and atraumatic. Eyes:  sclerae anicteric,conjunctive pink  Respiratory: Respiratory effort normal, BS equal bilaterally without rales, rhonchi, wheezing. Cardio: RRR with no MRGs. Peripheral pulses intact.  Abdomen: Soft,  Non-distended ,active bowel sounds. mild tenderness in the LLQ. Without guarding and Without rebound. No masses. Rectal: Not evaluated Musculoskeletal: Full ROM, Normal gait. Without edema. Skin:  Dry and intact without significant lesions or rashes Neuro: Alert and  oriented x4;  No focal deficits. Psych:  Cooperative. Normal  mood and affect.    [name], PA-C 9:58 AM

## 2022-12-09 ENCOUNTER — Ambulatory Visit: Payer: BC Managed Care – PPO | Admitting: Physician Assistant

## 2022-12-09 ENCOUNTER — Encounter: Payer: Self-pay | Admitting: Physician Assistant

## 2022-12-09 VITALS — BP 110/72 | HR 62 | Ht 60.0 in | Wt 127.5 lb

## 2022-12-09 DIAGNOSIS — K5900 Constipation, unspecified: Secondary | ICD-10-CM

## 2022-12-09 DIAGNOSIS — R1319 Other dysphagia: Secondary | ICD-10-CM

## 2022-12-09 DIAGNOSIS — K219 Gastro-esophageal reflux disease without esophagitis: Secondary | ICD-10-CM

## 2022-12-09 MED ORDER — OMEPRAZOLE 40 MG PO CPDR: 40.0000 mg | DELAYED_RELEASE_CAPSULE | Freq: Two times a day (BID) | ORAL | 0 refills | Status: DC

## 2022-12-09 NOTE — Patient Instructions (Addendum)
B?nh tro ng??c d? dy th?c qu?n, Ng??i l?n Gastroesophageal Reflux Disease, Adult Tro ng??c d? dy th?c qu?n (GER) x?y ra khi axit t? d? dy tro ln ?ng k?t n?i mi?ng v d? dy (th?c qu?n). Thng th??ng, th?c ?n di chuy?n xu?ng th?c qu?n v ? trong d? dy ?? tiu ha. Tuy nhin, khi m?t ng??i b? GER, th?c ?n v axit trong d? dy ?i khi tro ng??c tr? [place] th?c qu?n. N?u tnh tr?ng ny tr? thnh m?t v?n ?? nghim tr?ng h?n, ng??i ? c th? ???c ch?n ?on b? b?nh g?i l b?nh tro ng??c d? dy th?c qu?n (GERD). GERD x?y ra khi tro ng??c: Xu?t hi?n th??ng xuyn. Gy ra cc tri?u ch?ng th??ng xuyn ho?c n?ng. Gy ra cc v?n ?? nh? th??ng t?n th?c qu?n. Khi axit d? dy ti?p xc v?i th?c qu?n, axit c th? gy vim trong th?c qu?n. Theo th?i gian, GERD c th? t?o ra cc l? nh? (v?t lot) ? nim m?c th?c qu?n. Nguyn nhn g gy ra? Tnh tr?ng ny do m?t v?n ?? c?a ph?n c? gi?a th?c qu?n v d? dy (c? th?t th?c qu?n d??i, hay l lower esophageal sphincter, LES) gy ra. Thng th??ng c? LES ?ng l?i sau khi th?c ?n ?i qua th?c qu?n vo d? dy. Khi LES b? y?u ho?c b?t th??ng, c? khng ?ng theo ?ng cch v ?i?u ? khi?n cho th?c ?n v a xt d? dy tro ng??c tr? l?i th?c qu?n. LES c th? b? y?u do m?t s? ch?t ?n [place] nh?t ??nh, thu?c v cc tnh tr?ng b?nh l, bao g?m: S? d?ng thu?c l. Mang thai. Thot v? honh. S? d?ng r??u. M?t s? lo?i th?c ?n v ?? u?ng nh?t ??nh, nh? c ph, s c la, hnh v b?c h. ?i?u g lm t?ng nguy c?? Tnh tr?ng ny d? x?y ra h?n n?u qu v?: B? t?ng cn. B? r?i lo?n m lin k?t. Dng thu?c ch?ng vim khng steroid (NSAID), ch?ng h?n nh? ibuprofen. C cc d?u hi?u ho?c tri?u ch?ng g? Nh?ng tri?u ch?ng c?a tnh tr?ng ny bao g?m: ? nng. Kh nu?t ho?c ?au khi b? nu?t v c?m gic c m?t c?c trong h?ng. C?m gic ??ng trong mi?ng. H?i th? hi v c nhi?u n??c b?t. C?m gic kh ch?u trong b?ng ho?c ch??ng b?ng v ? h?i. ?au ng?c. Nh?ng tnh tr?ng khc nhau c th? gy  ?au ng?c. B?o ??m qu v? ?i khm chuyn gia ch?m [place] s?c kh?e n?u qu v? b? ?au ng?c. Kh th? ho?c th? kh kh. Ho lin t?c (m?n tnh) ho?c ho vo ban ?m. B? h?ng l?p men r?ng. S?t cn. Ch?n ?on tnh tr?ng ny nh? th? no? Tnh tr?ng ny c th? ???c ch?n ?on d?a vo khai thc b?nh s? v khm th?c th?. ?? xc ??nh qu v? b? GERD nh? hay n?ng, chuyn gia ch?m [place] s?c kh?e c?ng c th? theo di qu v? ?p ?ng v?i vi?c ?i?u tr? nh? th? no. Qu v? c?ng c th? ???c lm cc ki?m tra, bao g?m: M?t ki?m tra ?? khm d? dy v th?c qu?n b?ng m?t camera nh? (n?i soi). Ki?m tra ?o n?ng ?? a xt trong th?c qu?n c?a qu v?. Ki?m tra ?o m?c p l?c ln th?c qu?n c?a qu v?. Ki?m tra nu?t bari ho?c nu?t bari bi?n tnh ?? cho th?y hnh d?ng, kch th??c v ch?c n?ng c?a th?c qu?n c?a qu v?. Tnh tr?ng ny ???c ?i?u tr? nh?  th? no? Vi?c ?i?u tr? b?nh ny c th? khc nhau ty thu?c m?c ?? n?ng c?a tri?u ch?ng. Chuyn gia ch?m [place] s?c kh?e c?a qu v? c th? khuy?n ngh?: Thay ??i ch? ?? ?n. Thu?c. Ph?u thu?t. M?c tiu c?a ?i?u tr? l gip lm gi?m cc tri?u ch?ng v ng?n ng?a bi?n ch?ng. Tun th? nh?ng h??ng d?n ny ? nh: ?n v u?ng  Tun th? m?t ch? ?? ?n theo khuy?n ngh? c?a chuyn gia ch?m [place] s?c kh?e. Vi?c ny c th? l trnh cc th?c ?n v ?? u?ng nh?: C ph v tr, c ho?c khng c caffeine. ?? u?ng c r??u. ?? u?ng t?ng l?c v ?? u?ng dng trong th? thao. ?? u?ng c ga ho?c soda. S-c-la v cacao. B?c h v h??ng b?c h. T?i v hnh. C?i ng?a (Horseradish). Cc th?c ?n nhi?u gia v? v axit, bao g?m h?t tiu, b?t ?t, b?t c ri, gi?m, n??c s?t cay v n??c s?t th?t n??ng. N??c qu? ho?c qu? h? cam qut, ch?ng h?n nh? cam, chanh v chanh l cam. Cc th?c ?n c c chua, nh? n??c x?t ??, ?t, n??c x?t salsa v pizza km x?t ??Marland Kitchen Th?c ?n chin v nhi?u ch?t bo, ch?ng h?n nh? bnh rn, khoai ty chin, khoai ty rn v n??c x?t nhi?u ch?t bo. Th?t nhi?u ch?t bo, ch?ng h?n nh? hot dog (bnh m k?p  xc xch) v cc lo?i th?t ?? v tr?ng nhi?u m?, ch?ng h?n nh? th?t n?c l?ng, xc xch, gi?m bng v th?t l?n xng khi. Nh?ng s?n ph?m b? s?a giu ch?t bo, nh? s?a nguyn kem, b? v pho mt kem. ?n cc b?a nh?, th??ng xuyn thay v cc b?a no. Trnh u?ng nhi?u n??c khi ?n. Trnh ?n trong kho?ng 2-3 gi? tr??c khi ?i ng?. Trnh n?m xu?ng ngay sau khi ?n. Khng t?p th? d?c ngay sau khi ?n. L?i s?ng  Khng s? d?ng b?t k? s?n ph?m no c nicotine ho?c thu?c l. Nh?ng s?n ph?m ny bao g?m thu?c l d?ng ht, thu?c l d?ng nhai v d?ng c? ht thu?c, ch?ng h?n nh? thu?c l ?i?n t?. N?u qu v? c?n gip ?? ?? cai thu?c, hy h?i chuyn gia ch?m [place] s?c kh?e. C? g?ng gi?m c?ng th?ng b?ng cch s? d?ng cc ph??ng php nh? yoga ho?c thi?n. N?u qu v? c?n gip ?? ?? lm gi?m c?ng th?ng, hy h?i chuyn gia ch?m [place] s?c kh?e. N?u qu v? th?a cn, hy gi?m cn v? m?c c l?i cho s?c kh?e c?a qu v?. Hy h?i chuyn gia ch?m [place] s?c kh?e ?? ???c h??ng d?n v? m?c tiu gi?m cn an ton. H??ng d?n chung Ch  ??n b?t c? thay ??i no v? tri?u ch?ng c?a qu v?. Ch? s? d?ng thu?c khng k ??n v thu?c k ??n theo ch? d?n c?a chuyn gia ch?m [place] s?c kh?e. Khng dng aspirin, ibuprofen, ho?c cc thu?c ch?ng vim khng steroid (NSAID) khc tr? khi chuyn gia ch?m  s?c kh?e yu c?u qu v? dng nh?ng thu?c ny. M?c qu?n o r?ng. Khng m?c/?eo/mang b?t c? th? g ch?t quanh eo c th? t?o p l?c ln b?ng. Nng (nng cao) ??u gi??ng c?a qu v? thm 6 ins? (15 cm). Qu v? c th? s? d?ng nm cho vi?c ny. Trnh g?p ng??i n?u ?i?u ny khi?n cc tri?u ch?ng tr? nn tr?m tr?ng h?n. Tun th? theo t?t c? cc l?n khm l?i. ?i?u ny c vai tr quan tr?ng. Hy lin  l?c v?i chuyn gia ch?m [place] s?c kh?e n?u: Qu v? bi?: Cc tri?u ch?ng m?i. S?t cn khng r nguyn nhn. Kh nu?t ho?c b? ?au khi nu?t. Th? kh kh ho?c ho dai d?ng. Gi?ng khn. Cc tri?u ch?ng c?a qu v? khng c?i thi?n sau khi ???c ?i?u tr?Tasia Catchings c?u tr? gip ngay l?p  t?c n?u: Qu v? b? ?au ??t ng?t ? cnh tay, c?, hm, r?ng ho?c l?ng. Qu v? ??t nhin c?m th?y ?? m? hi, chng m?t ho?c chong vng. Qu v? b? ?au ng?c ho?c kh th?. Qu v? nn v ch?t nn c mu xanh l cy, mu vng, mu ?en ho?c trng gi?ng nh? b c ph ho?c mu. Qu v? b? ng?t. Qu v? c phn mu ??, l?n mu ho?c mu ?en. Qu v? khng th? nu?t, u?ng ho?c ?n. Nh?ng tri?u ch?ng ny c th? l bi?u hi?n c?a m?t v?n ?? nghim tr?ng c?n c?p c?u. Khng ch? xem tri?u ch?ng c h?t khng. Hy ?i khm ngay l?p t?c. G?i cho d?ch v? c?p c?u t?i ??a ph??ng (911 ? [place] Khng t? li xe ??n b?nh vi?n. Tm t?t Tro ng??c d? dy th?c qu?n x?y ra khi axit t? d? dy tro ln th?c qu?n. GERD l b?nh trong ? tro ng??c x?y ra th??ng xuyn, gy ra cc tri?u ch?ng n?ng ho?c th??ng xuyn, ho?c gy ra cc v?n ?? nh? t?n th??ng th?c qu?n. Vi?c ?i?u tr? b?nh ny c th? khc nhau ty thu?c m?c ?? n?ng c?a tri?u ch?ng. Chuyn gia ch?m [place] s?c kh?e c th? khuy?n ngh? thay ??i ch? ?? ?n u?ng v l?i s?ng, dng thu?c ho?c ph?u thu?t. You have been scheduled for an endoscopy. Please follow written instructions given to you at your visit today. If you use inhalers (even only as needed), please bring them with you on the day of your procedure.    Hy lin h? v?i chuyn gia ch?m [place] s?c kh?e n?u qu v? c tri?u ch?ng m?i ho?c tri?u ch?ng ? nn tr?m tr?ng h?n. Ch? s? d?ng thu?c khng k ??n v thu?c k ??n theo ch? d?n c?a chuyn gia ch?m [place] s?c kh?e. Khng dng aspirin, ibuprofen, ho?c cc thu?c ch?ng vim khng steroid (NSAID) khc tr? khi chuyn gia ch?m [place] s?c kh?e c?a qu v? cho php. Tun th? t?t c? cc l?n khm theo di theo ch? d?n c?a chuyn gia ch?m [place] s?c kh?e. ?i?u ny c vai tr quan tr?ng. Thng tin ny khng nh?m m?c ?ch thay th? cho l?i khuyn m chuyn gia ch?m [place] s?c kh?e ni v?i qu v?. Hy b?o ??m qu v? ph?i th?o lu?n b?t k? v?n ?? g m qu v? c v?i chuyn gia ch?m  s?c kh?e c?a qu  v?. Document Revised: 05/15/2020 Document Reviewed: 05/15/2020 Elsevier Patient Education  2023 [employer].  Due to recent changes in healthcare laws, you may see the results of your imaging and laboratory studies on MyChart before your provider has had a chance to review them.  We understand that in some cases there may be results that are confusing or concerning to you. Not all laboratory results come back in the same time frame and the provider may be waiting for multiple results in order to interpret others.  Please give [place] 48 hours in order for your provider to thoroughly review all the results before contacting the office for clarification of your results.

## 2022-12-13 ENCOUNTER — Encounter: Payer: Self-pay | Admitting: Gastroenterology

## 2022-12-13 ENCOUNTER — Ambulatory Visit (AMBULATORY_SURGERY_CENTER): Payer: BC Managed Care – PPO | Admitting: Gastroenterology

## 2022-12-13 VITALS — BP 99/61 | HR 60 | Temp 97.3°F | Resp 20 | Ht 60.0 in | Wt 127.0 lb

## 2022-12-13 DIAGNOSIS — K219 Gastro-esophageal reflux disease without esophagitis: Secondary | ICD-10-CM | POA: Diagnosis not present

## 2022-12-13 DIAGNOSIS — R1319 Other dysphagia: Secondary | ICD-10-CM | POA: Diagnosis not present

## 2022-12-13 DIAGNOSIS — K2289 Other specified disease of esophagus: Secondary | ICD-10-CM | POA: Diagnosis not present

## 2022-12-13 DIAGNOSIS — K2970 Gastritis, unspecified, without bleeding: Secondary | ICD-10-CM

## 2022-12-13 MED ORDER — OMEPRAZOLE 40 MG PO CPDR: 40.0000 mg | DELAYED_RELEASE_CAPSULE | Freq: Every day | ORAL | 1 refills | Status: DC

## 2022-12-13 MED ORDER — SODIUM CHLORIDE 0.9 % IV SOLN: 500.0000 mL | Freq: Once | INTRAVENOUS | Status: DC

## 2022-12-13 NOTE — Progress Notes (Signed)
Called to room to assist during endoscopic procedure.  Patient ID and intended procedure confirmed with present staff. Received instructions for my participation in the procedure from the performing physician.  

## 2022-12-13 NOTE — Op Note (Addendum)
[place] Patient Name: Maria Garrison Procedure Date: 12/13/2022 10:11 AM MRN: [id number] Endoscopist: Mauri Pole , MD, [id number] Age: 57 Referring MD:  Date of Birth: Feb 20, 1966 Gender: Female Account #: [id number] Procedure:                Upper GI endoscopy Indications:              Dysphagia, Epigastric abdominal pain Medicines:                Monitored Anesthesia Care Procedure:                After obtaining informed consent, the endoscope was                            passed under direct vision. Throughout the                            procedure, the patient's blood pressure, pulse, and                            oxygen saturations were monitored continuously. The                            GIF [id number] was introduced through the                            mouth, and advanced to the second part of duodenum.                            The upper GI endoscopy was accomplished without                            difficulty. The patient tolerated the procedure                            well. Scope In: Scope Out: Findings:                 The Z-line was regular and was found 36 cm from the                            incisors.                           No endoscopic abnormality was evident in the                            esophagus to explain the patient's complaint of                            dysphagia. It was decided, however, to proceed with                            dilation of the entire esophagus. The scope was  withdrawn. Dilation was performed with a Maloney                            dilator with no resistance at 50 Fr. The dilation                            site was examined following endoscope reinsertion                            and showed no change. Biopsies were obtained from                            the proximal and distal esophagus with cold forceps                            for histology of suspected  eosinophilic esophagitis.                           Patchy mild inflammation characterized by                            congestion (edema) and erythema was found in the                            entire examined stomach. Biopsies were taken with a                            cold forceps for histology. Biopsies were taken                            with a cold forceps for Helicobacter pylori testing.                           The cardia and gastric fundus were normal on                            retroflexion.                           The examined duodenum was normal. Complications:            No immediate complications. Estimated Blood Loss:     Estimated blood loss was minimal. Impression:               - Z-line regular, 36 cm from the incisors.                           - No endoscopic esophageal abnormality to explain                            patient's dysphagia. Esophagus dilated. Dilated.                           - Gastritis. Biopsied.                           -  Normal examined duodenum.                           - Biopsies were taken with a cold forceps for                            evaluation of eosinophilic esophagitis. Recommendation:           - Patient has a contact number available for                            emergencies. The signs and symptoms of potential                            delayed complications were discussed with the                            patient. Return to normal activities tomorrow.                            Written discharge instructions were provided to the                            patient.                           - Resume previous diet.                           - Continue present medications.                           - Await pathology results.                           - Follow an antireflux regimen.                           - Use Omeprazole '40mg'$  daily, 30 minutes before                            breakfast                           -  Return to GI office in 2- 3 months for visit with                            Vicie Mutters or Dr.Samaya Boardley. Mauri Pole, MD 12/13/2022 10:42:22 AM This report has been signed electronically.

## 2022-12-13 NOTE — Progress Notes (Signed)
Please refer to office visit note 12/09/22 by Vicie Mutters. No additional changes in H&P Patient is appropriate for planned procedure(s) and anesthesia in an ambulatory setting  K. Denzil Magnuson , MD [phone]

## 2022-12-13 NOTE — Progress Notes (Signed)
Report to PACU, RN, vss, BBS= Clear.  

## 2022-12-13 NOTE — Patient Instructions (Addendum)
Handout on gastritis given to patient.  Await pathology results. Follow an anti-reflux regimen - continue pantoprazole (sent refill prescription to [facility] pharmacy take 40 mg daily) Office appointment scheduled - if you need to change this day then call office to reschedule.      YOU HAD AN ENDOSCOPIC PROCEDURE TODAY AT [facility]:   Refer to the procedure report that was given to you for any specific questions about what was found during the examination.  If the procedure report does not answer your questions, please call your gastroenterologist to clarify.  If you requested that your care partner not be given the details of your procedure findings, then the procedure report has been included in a sealed envelope for you to review at your convenience later.  YOU SHOULD EXPECT: Some feelings of bloating in the abdomen. Passage of more gas than usual.  Walking can help get rid of the air that was put into your GI tract during the procedure and reduce the bloating. If you had a lower endoscopy (such as a colonoscopy or flexible sigmoidoscopy) you may notice spotting of blood in your stool or on the toilet paper. If you underwent a bowel prep for your procedure, you may not have a normal bowel movement for a few days.  Please Note:  You might notice some irritation and congestion in your nose or some drainage.  This is from the oxygen used during your procedure.  There is no need for concern and it should clear up in a day or so.  SYMPTOMS TO REPORT IMMEDIATELY:   Following upper endoscopy (EGD)  Vomiting of blood or coffee ground material  New chest pain or pain under the shoulder blades  Painful or persistently difficult swallowing  New shortness of breath  Fever of 100F or higher  Black, tarry-looking stools  For urgent or emergent issues, a gastroenterologist can be reached at any hour by calling [phone]. Do not use MyChart messaging for urgent concerns.     DIET:  We do recommend a small meal at first, but then you may proceed to your regular diet.  Drink plenty of fluids but you should avoid alcoholic beverages for 24 hours.  ACTIVITY:  You should plan to take it easy for the rest of today and you should NOT DRIVE or use heavy machinery until tomorrow (because of the sedation medicines used during the test).    FOLLOW UP: Our staff will call the number listed on your records the next business day following your procedure.  We will call around 7:15- 8:00 am to check on you and address any questions or concerns that you may have regarding the information given to you following your procedure. If we do not reach you, we will leave a message.     If any biopsies were taken you will be contacted by phone or by letter within the next 1-3 weeks.  Please call us at [phone] if you have not heard about the biopsies in 3 weeks.    SIGNATURES/CONFIDENTIALITY: You and/or your care partner have signed paperwork which will be entered into your electronic medical record.  These signatures attest to the fact that that the information above on your After Visit Summary has been reviewed and is understood.  Full responsibility of the confidentiality of this discharge information lies with you and/or your care-partner.

## 2022-12-13 NOTE — Progress Notes (Signed)
Pt's states no medical or surgical changes since previsit or office visit. 

## 2022-12-16 ENCOUNTER — Telehealth: Payer: Self-pay | Admitting: *Deleted

## 2022-12-16 NOTE — Telephone Encounter (Signed)
  Follow up Call-     12/13/2022    9:24 AM  Call back number  Post procedure Call Back phone  # [phone]  Permission to leave phone message Yes    Post procedure follow up phone call. No answer at number given.  Left message on voicemail.

## 2022-12-19 ENCOUNTER — Encounter: Payer: Self-pay | Admitting: Gastroenterology

## 2023-01-08 ENCOUNTER — Ambulatory Visit (INDEPENDENT_AMBULATORY_CARE_PROVIDER_SITE_OTHER): Payer: BC Managed Care – PPO | Admitting: Family Medicine

## 2023-01-08 ENCOUNTER — Encounter: Payer: Self-pay | Admitting: Family Medicine

## 2023-01-08 ENCOUNTER — Ambulatory Visit (INDEPENDENT_AMBULATORY_CARE_PROVIDER_SITE_OTHER): Payer: BC Managed Care – PPO

## 2023-01-08 VITALS — BP 120/76 | HR 75 | Temp 98.6°F | Resp 16 | Ht 60.0 in | Wt 123.0 lb

## 2023-01-08 DIAGNOSIS — M50322 Other cervical disc degeneration at C5-C6 level: Secondary | ICD-10-CM | POA: Diagnosis not present

## 2023-01-08 DIAGNOSIS — M25562 Pain in left knee: Secondary | ICD-10-CM

## 2023-01-08 DIAGNOSIS — M542 Cervicalgia: Secondary | ICD-10-CM

## 2023-01-08 DIAGNOSIS — M79671 Pain in right foot: Secondary | ICD-10-CM

## 2023-01-08 DIAGNOSIS — M5441 Lumbago with sciatica, right side: Secondary | ICD-10-CM

## 2023-01-08 DIAGNOSIS — R202 Paresthesia of skin: Secondary | ICD-10-CM

## 2023-01-08 DIAGNOSIS — M5442 Lumbago with sciatica, left side: Secondary | ICD-10-CM

## 2023-01-08 DIAGNOSIS — M7050 Other bursitis of knee, unspecified knee: Secondary | ICD-10-CM | POA: Diagnosis not present

## 2023-01-08 DIAGNOSIS — M25561 Pain in right knee: Secondary | ICD-10-CM

## 2023-01-08 DIAGNOSIS — M47816 Spondylosis without myelopathy or radiculopathy, lumbar region: Secondary | ICD-10-CM | POA: Diagnosis not present

## 2023-01-08 MED ORDER — PREDNISONE 10 MG PO TABS: ORAL_TABLET | ORAL | 0 refills | Status: DC

## 2023-01-08 MED ORDER — MELOXICAM 7.5 MG PO TABS: 7.5000 mg | ORAL_TABLET | Freq: Every day | ORAL | 0 refills | Status: DC

## 2023-01-08 NOTE — Progress Notes (Signed)
Established Patient Office Visit   Subjective  Patient ID: Maria Garrison, female    DOB: 10-Jun-1966  Age: 57 y.o. MRN: [id number]  Chief Complaint  Patient presents with   Back Pain    Complains of low back pain, she reports this radiates down her legs   Neck Pain    Complains of neck  pain, she reports this radiates to both of her arms  [place] interpreter present.  Patient is a 57 year old female with pmh sig for chronic pelvic pain, GERD, constipation, joint pain who presents with acute concerns.  Patient endorses bilateral low back pain with sciatica.  Endorses numbness, tingling in bilateral LEs.  Patient does a lot of standing at work today.  Wears tennis shoes while at work.  No red flag symptoms.  Patient also notes pain, edema, tenderness of bilateral medial knees.  Patient with paresthesias in RUE along the ulnar side of forearm into fifth digit.  Patient also notes lateral right foot pain worse with standing and walking.      ROS Negative unless stated above    Objective:     BP 120/76 (BP Location: Right Arm, Patient Position: Sitting, Cuff Size: Small)   Pulse 75   Temp 98.6 F (37 C) (Oral)   Resp 16   Ht 5' (1.524 m)   Wt 123 lb (55.8 kg)   LMP 12/19/2019 (Approximate)   SpO2 97%   BMI 24.02 kg/m    Physical Exam Musculoskeletal:     Cervical back: Bony tenderness present.     Thoracic back: Bony tenderness present.     Lumbar back: Bony tenderness present.       Legs:     Comments: TTP of cervical, upper thoracic, and lumbar spine midline, and bilateral sciatic nerves.  No LE weakness.  No crepitus of bilateral knees.  TTP of medial knees with edema L>R.   TTP of right lateral foot 5th metatarsal.  No deformity, edema, erythema of right foot.  TTP of right medial epicondyle with paresthesias into R 5th digit.  No edema, no deformity.  Positive Tinel's.  Normal ROM.  Skin:    General: Skin is warm and dry.      No results found for any visits on  01/08/23.    Assessment & Plan:  Acute bilateral low back pain with bilateral sciatica -Discussed possible causes of symptoms. -Discussed supportive care with OTC topical medications, Tylenol or NSAIDs, heat, massage, stretching -Will obtain x-ray given ongoing history of symptoms -Start prednisone taper -Mobic as needed -For continued or worsening symptoms consider PT and Ortho referral -     DG Lumbar Spine 2-3 Views -     predniSONE; Take 4 tabs every morning for 3 days, 3 tabs for 2 days, 2 tabs for 2 days, 1 tab for 1 day.  Dispense: 23 tablet; Refill: 0 -     Meloxicam; Take 1 tablet (7.5 mg total) by mouth daily.  Dispense: 30 tablet; Refill: 0  Acute pain of right foot -Discussed possible causes including arthritis, stress fracture, sprain.  Patient does not recall injury. -Discussed supportive care including OTC medications, supportive shoes, rest, elevation, etc. -Obtain x-ray to rule out fracture -     DG Foot Complete Right -     predniSONE; Take 4 tabs every morning for 3 days, 3 tabs for 2 days, 2 tabs for 2 days, 1 tab for 1 day.  Dispense: 23 tablet; Refill: 0 -     Meloxicam; Take  1 tablet (7.5 mg total) by mouth daily.  Dispense: 30 tablet; Refill: 0  Right hand paresthesia -Concern for cervical radiculopathy causing symptoms -Obtain imaging -Supportive care -Further recommendations based on results -     DG Cervical Spine Complete  Pes anserine bursitis -Supportive care including topical Aspercreme, rest, ice, heat, etc. -     predniSONE; Take 4 tabs every morning for 3 days, 3 tabs for 2 days, 2 tabs for 2 days, 1 tab for 1 day.  Dispense: 23 tablet; Refill: 0 -     Meloxicam; Take 1 tablet (7.5 mg total) by mouth daily.  Dispense: 30 tablet; Refill: 0  Acute pain of both knees -Discussed possible symptoms including arthritis, strain from overuse -     Meloxicam; Take 1 tablet (7.5 mg total) by mouth daily.  Dispense: 30 tablet; Refill: 0  Neck  pain -Concern for cervical radiculopathy -Obtain imaging -Supportive care -PT or Ortho based on results. -     DG Cervical Spine Complete -     predniSONE; Take 4 tabs every morning for 3 days, 3 tabs for 2 days, 2 tabs for 2 days, 1 tab for 1 day.  Dispense: 23 tablet; Refill: 0 -     Meloxicam; Take 1 tablet (7.5 mg total) by mouth daily.  Dispense: 30 tablet; Refill: 0   Return if symptoms worsen or fail to improve.   Billie Ruddy, MD

## 2023-01-08 NOTE — Patient Instructions (Signed)
A prescription for Mobic and prednisone were sent to your pharmacy.  Orders for x-rays were placed and you can have this done while you are here in clinic.

## 2023-01-21 NOTE — Progress Notes (Signed)
Reviewed and agree with documentation and assessment and plan. K. Veena Alpheus Stiff , MD   

## 2023-01-22 ENCOUNTER — Ambulatory Visit: Payer: BC Managed Care – PPO | Admitting: Family Medicine

## 2023-01-22 ENCOUNTER — Encounter: Payer: Self-pay | Admitting: Family Medicine

## 2023-01-22 VITALS — BP 112/68 | HR 68 | Temp 98.8°F | Wt 129.8 lb

## 2023-01-22 DIAGNOSIS — M1990 Unspecified osteoarthritis, unspecified site: Secondary | ICD-10-CM

## 2023-01-22 DIAGNOSIS — M542 Cervicalgia: Secondary | ICD-10-CM | POA: Diagnosis not present

## 2023-01-22 DIAGNOSIS — M5442 Lumbago with sciatica, left side: Secondary | ICD-10-CM | POA: Diagnosis not present

## 2023-01-22 DIAGNOSIS — M5441 Lumbago with sciatica, right side: Secondary | ICD-10-CM

## 2023-01-22 DIAGNOSIS — S46811D Strain of other muscles, fascia and tendons at shoulder and upper arm level, right arm, subsequent encounter: Secondary | ICD-10-CM

## 2023-01-22 DIAGNOSIS — M7701 Medial epicondylitis, right elbow: Secondary | ICD-10-CM

## 2023-01-22 DIAGNOSIS — S46811A Strain of other muscles, fascia and tendons at shoulder and upper arm level, right arm, initial encounter: Secondary | ICD-10-CM | POA: Diagnosis not present

## 2023-01-22 MED ORDER — CYCLOBENZAPRINE HCL 5 MG PO TABS: 5.0000 mg | ORAL_TABLET | Freq: Every evening | ORAL | 0 refills | Status: DC | PRN

## 2023-01-22 NOTE — Patient Instructions (Signed)
A referral was placed for you see the orthopedic doctors and physical therapy.  They have been advised to call your daughter Efraim Kaufmann to help with scheduling these appointments.

## 2023-01-22 NOTE — Progress Notes (Signed)
Established Patient Office Visit   Subjective  Patient ID: Maria Garrison, female    DOB: January 05, 1966  Age: 57 y.o. MRN: [id number]  Chief Complaint  Patient presents with   Follow-up    Still pain in back. Pain level is 9   [place] interpreter present.  Patient is a 57 year old female seen for follow-up of low back pain.  For low back pain with sciatica.  X-rays showed mild spondylitis.  Patient states pain is a 9/10, down from 10/10 since using steroid taper.  Patient states pain comes and goes.  Standing on mat at work.  Stretching helps relieve the pain.  Pain worse with standing.  Knee pain, neck pain, ulnar-sided numbness of the right hand.  Patient endorses swelling in the right biceps and tenderness of medial epicondyle last week now improving.  Feels like Mobic helps some.  Back Pain This is a chronic problem. The current episode started more than 1 month ago. The problem has been gradually worsening since onset. The pain is present in the lumbar spine. The pain is at a severity of 9/10.      Review of Systems  Musculoskeletal:  Positive for back pain.   Negative unless stated above    Objective:     BP 112/68 (BP Location: Right Arm, Patient Position: Sitting, Cuff Size: Normal)   Pulse 68   Temp 98.8 F (37.1 C) (Oral)   Wt 129 lb 12.8 oz (58.9 kg)   LMP 12/19/2019 (Approximate)   SpO2 99%   BMI 25.35 kg/m    Physical Exam Constitutional:      General: She is not in acute distress.    Appearance: Normal appearance.  HENT:     Head: Normocephalic and atraumatic.     Nose: Nose normal.     Mouth/Throat:     Mouth: Mucous membranes are moist.  Cardiovascular:     Rate and Rhythm: Normal rate and regular rhythm.     Heart sounds: Normal heart sounds. No murmur heard.    No gallop.  Pulmonary:     Effort: Pulmonary effort is normal. No respiratory distress.     Breath sounds: Normal breath sounds. No wheezing, rhonchi or rales.  Musculoskeletal:     Left  elbow: Normal.     Thoracic back: Bony tenderness present.     Comments: TTP of R medial Trapezius muscle.  TTP of upper thoracic spine and b/l SI joints.  Mild edema of R distal bicep, TTP of medial R epicondyle. FROM of R elbow.  No radiating numbness or tingling with tapping on epicondyles.  Skin:    General: Skin is warm and dry.  Neurological:     Mental Status: She is alert and oriented to person, place, and time.      No results found for any visits on 01/22/23.    Assessment & Plan:  Arthritis -     Ambulatory referral to Physical Therapy -     Ambulatory referral to Orthopedic Surgery  Acute bilateral low back pain with bilateral sciatica -     Ambulatory referral to Physical Therapy -     Ambulatory referral to Orthopedic Surgery  Strain of right trapezius muscle, subsequent encounter -     Ambulatory referral to Physical Therapy -     Ambulatory referral to Orthopedic Surgery -     Cyclobenzaprine HCl; Take 1 tablet (5 mg total) by mouth at bedtime as needed for muscle spasms.  Dispense: 30 tablet; Refill: 0  Neck pain -     Ambulatory referral to Physical Therapy -     Ambulatory referral to Orthopedic Surgery -     Cyclobenzaprine HCl; Take 1 tablet (5 mg total) by mouth at bedtime as needed for muscle spasms.  Dispense: 30 tablet; Refill: 0  Medial epicondylitis of right elbow -     Ambulatory referral to Physical Therapy -     Ambulatory referral to Orthopedic Surgery   Imaging from 01/08/2023 reviewed with patient.  Mild arthritis and low back and neck noted.  Continue supportive care with heat, OTC topical analgesics, NSAIDs, or Tylenol.  Advised right elbow epicondylitis likely 2/2 overuse.  Stretching encouraged.  Insoles in shoes when at work.  Muscle relaxer nightly as needed.  Referral to PT and Ortho for further recommendations.  Return if symptoms worsen or fail to improve.   Deeann Saint, MD

## 2023-01-29 ENCOUNTER — Ambulatory Visit: Payer: BC Managed Care – PPO | Admitting: Sports Medicine

## 2023-01-29 NOTE — Progress Notes (Signed)
01/31/2023 Maria Garrison [id number] 04-26-1966  Referring provider: Deeann Saint, MD Primary GI doctor: Dr. Lavon Paganini  ASSESSMENT AND PLAN:   Esophageal dysphagia/globus sensation with history of GERD S/p EGD with empiric dilitation with 40 % improvement Had recent prednisone, could be GERD related dysmotility Will increase PPI to BID for 3 months Will proceed with barium swallow to evaluate for dysmotility/GERD, if negative and returns with continuing symptoms can consider Ph study versus function. Will get RUQ [place] to rule out gallbladder though no elevated LFTs  Constipation with history of abdominal pain chronic 02/24/2017 colonoscopy with diverticulosis, hemorrhoids recall 10 years Continue MiraLAX daily  Chest pain From physical exam and history, likely costochondritis- do lidocaine patches or voltern gel, ER precautions discussed.  Consider checking Vitamin D Follow up with PCP  *Due to language barrier, an interpreter was present during the history-taking and subsequent discussion (and for part of the physical exam) with this patient.    Patient Care Team: Deeann Saint, MD as PCP - General (Family Medicine)  HISTORY OF PRESENT ILLNESS: 57 y.o. [place] female with a past medical history of anal fissure and others listed below presents for evaluation of GERD/dysphagia.   02/24/2017 colonoscopy with excellent prep for rectal bleeding showed mild diverticulosis sigmoid descending and ascending colon.  Nonbleeding internal hemorrhoids recall 10 years 10/18/2022 office visit with primary care for GERD with associated dysphagia. 12/09/2022 office visit with myself for dysphagia and GERD 12/13/2022 for EGD with Dr. Lavon Paganini normal esophagus, dilated empirically of the esophagus, gastritis, normal duodenum.  Negative EOE, negative H. pylori.  Just showed acid reflux changes.  Patient is here for follow-up after empiric dilatation. She states her swallowing is better about  40 % better, still feels globus sensation and occ food getting stuck.  She continues to have stomach irritation/GERD with epigastric discomfort, has had some RUQ pain.. Denies nausea, vomiting.   She was recently placed on prednisone for her back, she has a few more left at home.  Can have BM once every day.  No melena, no hematochezia.  Weight is up/stable. No NSAIDS, no ETOH. No supplements over the counter.    Wt Readings from Last 3 Encounters:  01/31/23 130 lb (59 kg)  01/22/23 129 lb 12.8 oz (58.9 kg)  01/08/23 123 lb (55.8 kg)     She  reports that she has never smoked. She has never used smokeless tobacco. She reports that she does not drink alcohol and does not use drugs.  RELEVANT LABS AND IMAGING: CBC    Component Value Date/Time   WBC 5.0 08/15/2022 1555   RBC 4.43 08/15/2022 1555   HGB 12.5 08/15/2022 1555   HGB 13.5 09/29/2019 0917   HCT 38.3 08/15/2022 1555   HCT 40.8 09/29/2019 0917   PLT 299.0 08/15/2022 1555   PLT 326 09/29/2019 0917   MCV 86.3 08/15/2022 1555   MCV 87 09/29/2019 0917   MCH 28.6 07/19/2020 1040   MCHC 32.8 08/15/2022 1555   RDW 13.2 08/15/2022 1555   RDW 13.2 09/29/2019 0917   LYMPHSABS 1.6 08/15/2022 1555   LYMPHSABS 1.6 03/13/2017 1127   MONOABS 0.5 08/15/2022 1555   EOSABS 0.1 08/15/2022 1555   EOSABS 0.1 03/13/2017 1127   BASOSABS 0.0 08/15/2022 1555   BASOSABS 0.0 03/13/2017 1127   Recent Labs    04/29/22 1022 08/15/22 1555  HGB 12.8 12.5     CMP     Component Value Date/Time   NA 139 08/15/2022  1555   NA 142 09/29/2019 0917   K 4.1 08/15/2022 1555   CL 103 08/15/2022 1555   CO2 31 08/15/2022 1555   GLUCOSE 72 08/15/2022 1555   BUN 18 08/15/2022 1555   BUN 13 09/29/2019 0917   CREATININE 0.90 08/15/2022 1555   CREATININE 0.82 07/19/2020 1040   CALCIUM 9.3 08/15/2022 1555   PROT 7.7 08/15/2022 1555   PROT 7.3 09/29/2019 0917   ALBUMIN 4.3 08/15/2022 1555   ALBUMIN 4.5 09/29/2019 0917   AST 23 08/15/2022 1555    ALT 16 08/15/2022 1555   ALKPHOS 116 08/15/2022 1555   BILITOT 0.2 08/15/2022 1555   BILITOT 0.3 09/29/2019 0917   GFRNONAA 82 07/19/2020 1040   GFRAA 95 07/19/2020 1040      Latest Ref Rng & Units 08/15/2022    3:55 PM 04/29/2022   10:22 AM 03/29/2021    9:14 AM  Hepatic Function  Total Protein 6.0 - 8.3 g/dL 7.7  7.5  7.3   Albumin 3.5 - 5.2 g/dL 4.3  4.4  4.3   AST 0 - 37 U/L 23  27  21    ALT 0 - 35 U/L 16  23  15    Alk Phosphatase 39 - 117 U/L 116  103  120   Total Bilirubin 0.2 - 1.2 mg/dL 0.2  0.5  0.4       Current Medications:      Current Outpatient Medications (Analgesics):    meloxicam (MOBIC) 15 MG tablet, Take 1 tablet (15 mg total) by mouth daily.   Current Outpatient Medications (Other):    cyclobenzaprine (FLEXERIL) 5 MG tablet, Take 1 tablet (5 mg total) by mouth at bedtime as needed for muscle spasms.   omeprazole (PRILOSEC) 40 MG capsule, Take 1 capsule (40 mg total) by mouth daily.  Medical History:  Past Medical History:  Diagnosis Date   Abdominal pain in female    For 3-4 years   COVID-19 10/11/2019   Heart murmur    Microscopic hematuria 2021   Spinal stenosis    Urinary incontinence    Allergies: No Known Allergies   Surgical History:  She  has a past surgical history that includes Hemorroidectomy; Colonoscopy; and Esophagogastroduodenoscopy. Family History:  Her family history includes Cirrhosis in her father; Other in her mother.  REVIEW OF SYSTEMS  : All other systems reviewed and negative except where noted in the History of Present Illness.  PHYSICAL EXAM: BP 118/60   Pulse 75   Ht 5' (1.524 m)   Wt 130 lb (59 kg)   LMP 12/19/2019 (Approximate)   BMI 25.39 kg/m  General Appearance: Well nourished, in no apparent distress. Head:   Normocephalic and atraumatic. Eyes:  sclerae anicteric,conjunctive pink  Respiratory: Respiratory effort normal, BS equal bilaterally without rales, rhonchi, wheezing. Cardio: RRR with no MRGs.  Peripheral pulses intact.  Abdomen: Soft,  Non-distended ,active bowel sounds. mild tenderness in the RUQ and epigastric, negative murphy. Without guarding and Without rebound. No masses. Rectal: Not evaluated Musculoskeletal: Chest wall tenderness to palpation bilateral. Full ROM, Normal gait. Without edema. Skin:  Dry and intact without significant lesions or rashes Neuro: Alert and  oriented x4;  No focal deficits. Psych:  Cooperative. Normal mood and affect.    Doree Albee, PA-C 9:21 AM

## 2023-01-30 ENCOUNTER — Encounter: Payer: Self-pay | Admitting: Sports Medicine

## 2023-01-30 ENCOUNTER — Ambulatory Visit (INDEPENDENT_AMBULATORY_CARE_PROVIDER_SITE_OTHER): Payer: BC Managed Care – PPO | Admitting: Sports Medicine

## 2023-01-30 ENCOUNTER — Other Ambulatory Visit (INDEPENDENT_AMBULATORY_CARE_PROVIDER_SITE_OTHER): Payer: BC Managed Care – PPO

## 2023-01-30 DIAGNOSIS — M4306 Spondylolysis, lumbar region: Secondary | ICD-10-CM | POA: Diagnosis not present

## 2023-01-30 DIAGNOSIS — M5030 Other cervical disc degeneration, unspecified cervical region: Secondary | ICD-10-CM | POA: Diagnosis not present

## 2023-01-30 DIAGNOSIS — M5441 Lumbago with sciatica, right side: Secondary | ICD-10-CM

## 2023-01-30 DIAGNOSIS — M5442 Lumbago with sciatica, left side: Secondary | ICD-10-CM

## 2023-01-30 DIAGNOSIS — G8929 Other chronic pain: Secondary | ICD-10-CM

## 2023-01-30 DIAGNOSIS — M62830 Muscle spasm of back: Secondary | ICD-10-CM

## 2023-01-30 MED ORDER — MELOXICAM 15 MG PO TABS
15.0000 mg | ORAL_TABLET | Freq: Every day | ORAL | 1 refills | Status: DC
Start: 2023-01-30 — End: 2023-07-15

## 2023-01-30 NOTE — Progress Notes (Signed)
Maria Garrison - 57 y.o. female MRN [id number]  Date of birth: [birth year]  Office Visit Note: Visit Date: 01/30/2023 PCP: Maria Saint, MD Referred by: Maria Saint, MD  Subjective: Chief Complaint  Patient presents with   Lower Back - Pain   HPI: Maria Garrison is a pleasant 57 y.o. female who presents today for chronic low back pain with bilateral sciatica.  The use of an in person [place] interpreter was used at the entirety of the visit today.  [name] has had low back pain for 2 years.  She has done stretching and other activities in the past without relief. She had been referred to physical therapy by her primary care physician.  It is not there all the time but has been aggravated here more recently.  She is having radicular symptoms that goes down the posterior lateral leg right greater than left although at times it will go down both legs.  She does stand longer times for work which makes it worse.  She does note some weakness of the right leg.  No change in bowel or bladder incontinence.  She also has neck and right sided posterior shoulder/trapezius pain.  It feels tight.  Occasionally she will get numbness and tingling that goes down the ulnar aspect of the arm into the fourth and fifth digit.  She has not yet started physical therapy for this.  Taking Meloxicam 7.5mg  qd. Completed lower dose prednisone taper with some relief of her symptoms but still rather significant.  Also has Flexeril 5 mg nightly.  Pertinent ROS were reviewed with the patient and found to be negative unless otherwise specified above in HPI.   Assessment & Plan: Visit Diagnoses:  1. Chronic midline low back pain with bilateral sciatica   2. Spondylolysis, lumbar region   3. DDD (degenerative disc disease), cervical   4. Spasm of right trapezius muscle    Plan: I did discuss with the patient today that it does seem she is experiencing lumbar radiculopathy, worse on the right side which does seem to fit  more of an L5 dermatome.  Her pain has been on and off for 2+ years and she has failed conservative measures with only mild improvement with Medrol Dosepak.  At this point I think we need to obtain an MRI of the lumbar spine for further evaluation, especially given the chronicity and the mild weakness on the right with L5 myotome testing with great toe extension.  She is reporting some radicular symptoms down the right arm, however her neck x-rays only showed mild degenerative disc disease most notable at the C5-6 6 region and her pain and tingling is more in the fourth and fifth digit.  I think some of this is referred pain coming from her right trapezius spasm/hypertonicity.  She will get started in formalized physical therapy for this.  We will increase her meloxicam from 7.5 mg to 15 mg to be taken once daily.  She may take 2 tablets of the 7.5 mg until she runs out and then may start her 15 mg tablet that I prescribed.  She may take Flexeril 5 mg at bedtime only if needed.  Future treatment considerations may be trigger point injections into the right trapezius muscle.  Could consider cervical MRI versus EMG/nerve conduction studies in the future if not improving with conservative measures.  Follow-up: Return for f/u in 3 business days  after lumbar MRI to review.   Meds & Orders:  Meds ordered this  encounter  Medications   meloxicam (MOBIC) 15 MG tablet    Sig: Take 1 tablet (15 mg total) by mouth daily.    Dispense:  30 tablet    Refill:  1    Begin after completing initial Meloxicam dose (7.5mg  BID)    Orders Placed This Encounter  Procedures   XR Lumbar Spine 2-3 Views   MR Lumbar Spine w/o contrast     Procedures: No procedures performed      Clinical History: No specialty comments available.  She reports that she has never smoked. She has never used smokeless tobacco.  Recent Labs    04/29/22 1022  HGBA1C 5.8    Objective:   Vital Signs: LMP 12/19/2019 (Approximate)    Physical Exam  Gen: Well-appearing, in no acute distress; non-toxic CV: Regular Rate. Well-perfused. Warm.  Resp: Breathing unlabored on room air; no wheezing. Psych: Fluid speech in conversation; appropriate affect; normal thought process Neuro: Sensation intact throughout. No gross coordination deficits.   Ortho Exam - Cervical/Shoulder: Full range of motion with flexion and extension, sidebending and rotation..  No midline spinous process TTP.  Full use and motion of bilateral upper extremities.  Negative Spurling's test bilaterally.  There is significant hypertonicity of the right trapezius muscle belly, this is tender to palpation.  - Lumbar: There is full range of motion with flexion and extension of the lumbar spine.  There is some generalized TTP near the L5 region.  There is also TTP over bilateral SI joint posteriorly with positive Fortin's point test.  No pain with flexion, there is some worsening with extension.  Negative straight leg raise on the left, positive straight leg raise on the right.  2+ patellar and Achilles DTRs bilaterally.  There is mild weakness with great toe extension of the right foot, otherwise full strength of bilateral lower extremities.  Imaging:  Narrative & Impression  CLINICAL DATA:  Pain in the first and second metacarpophalangeal joints and wrist.   EXAM: RIGHT HAND - COMPLETE 3+ VIEW   COMPARISON:  None Available.   FINDINGS: There is no evidence of fracture or dislocation. There is no evidence of arthropathy or other focal bone abnormality. Soft tissues are unremarkable.   IMPRESSION: Negative.     Electronically Signed   By: Norva Pavlov M.D.   On: 08/19/2022 07:28    Narrative & Impression  CLINICAL DATA:  Chronic neck pain.   EXAM: CERVICAL SPINE - COMPLETE 4+ VIEW   COMPARISON:  None Available.   FINDINGS: There is no acute fracture or subluxation of the cervical spine. There is straightening of normal cervical  lordosis which may be positional or due to muscle spasm. Mild degenerative changes at C5-C6. The visualized posterior elements and odontoid appear intact. There is anatomic alignment of the lateral masses of C1 and C2. The soft tissues are unremarkable   IMPRESSION: 1. No acute findings. 2. Mild degenerative changes at C5-C6.     Electronically Signed   By: Elgie Collard M.D.   On: 01/10/2023 01:55    Narrative & Impression  CLINICAL DATA:  Low back pain and right foot pain.   EXAM: LUMBAR SPINE - 2-3 VIEW; RIGHT FOOT COMPLETE - 3+ VIEW   COMPARISON:  CT abdomen and pelvis 12/22/2019. No prior right foot series.   FINDINGS: Lumbar spine, routine five views:   There is no evidence of lumbar spine fracture. Alignment is normal. Intervertebral disc spaces are maintained. There is mild lumbar spondylosis and facet spurring.  Comparison prior study reveals no significant interval change. Both SI joints are patent. No nephrolithiasis is seen.   Three views of the right foot:   There is normal bone mineralization. No evidence of fracture, dislocation or tibiotalar effusion. Arthritic changes are not seen. There is an accessory navicular bone. Soft tissues are unremarkable.   IMPRESSION: 1. Mild lumbar spondylosis without evidence of fractures or malalignment. 2. No evidence of fracture or dislocation of the right foot.     Electronically Signed   By: Almira Bar M.D.   On: 01/10/2023 02:03   XR Lumbar Spine 2-3 Views  Result Date: 01/30/2023 2 views of the lumbar spine including flexion and extension views were ordered and reviewed by myself.  There is lower lumbar DDD, however no instability with worsening retro or anterograde listhesis with flexion/extension views.   Past Medical/Family/Surgical/Social History: Medications & Allergies reviewed per EMR, new medications updated. Patient Active Problem List   Diagnosis Date Noted   Microscopic hematuria  07/19/2020   Gastroesophageal reflux disease 07/19/2020   Constipation 07/19/2020   Chronic generalized abdominal pain 07/19/2020   Spinal stenosis 12/19/2017   Pelvic pain in female 10/04/2015   Dyspareunia in female 10/04/2015   Encounter for routine gynecological examination 10/04/2015   Abdominal pain, chronic, right lower quadrant 07/27/2015   Past Medical History:  Diagnosis Date   Abdominal pain in female    For 3-4 years   COVID-19 10/11/2019   Heart murmur    Microscopic hematuria 2021   Spinal stenosis    Urinary incontinence    Family History  Problem Relation Age of Onset   Other Mother        Lung Disease??   Cirrhosis Father    Colon cancer Neg Hx    Colon polyps Neg Hx    Esophageal cancer Neg Hx    Pancreatic cancer Neg Hx    Stomach cancer Neg Hx    Past Surgical History:  Procedure Laterality Date   COLONOSCOPY     HEMORROIDECTOMY     Social History   Occupational History   Not on file  Tobacco Use   Smoking status: Never   Smokeless tobacco: Never  Vaping Use   Vaping Use: Never used  Substance and Sexual Activity   Alcohol use: No   Drug use: No   Sexual activity: Yes    Partners: Male    Birth control/protection: None

## 2023-01-31 ENCOUNTER — Ambulatory Visit: Payer: BC Managed Care – PPO | Admitting: Physician Assistant

## 2023-01-31 ENCOUNTER — Encounter: Payer: Self-pay | Admitting: Physician Assistant

## 2023-01-31 VITALS — BP 118/60 | HR 75 | Ht 60.0 in | Wt 130.0 lb

## 2023-01-31 DIAGNOSIS — K5900 Constipation, unspecified: Secondary | ICD-10-CM

## 2023-01-31 DIAGNOSIS — R1011 Right upper quadrant pain: Secondary | ICD-10-CM

## 2023-01-31 DIAGNOSIS — R1319 Other dysphagia: Secondary | ICD-10-CM

## 2023-01-31 DIAGNOSIS — K219 Gastro-esophageal reflux disease without esophagitis: Secondary | ICD-10-CM

## 2023-01-31 MED ORDER — PANTOPRAZOLE SODIUM 40 MG PO TBEC
40.0000 mg | DELAYED_RELEASE_TABLET | Freq: Two times a day (BID) | ORAL | 0 refills | Status: DC
Start: 2023-01-31 — End: 2023-05-29

## 2023-01-31 NOTE — Patient Instructions (Signed)
Stop prilosec Start on protonix 40 mg twice dialy 30-1 hr before food for 3 months Then go to once daily  No aleve, ibuprofen, goody powders, as these are antiinflammatories and can cause inflammation in your stomach, increase bleeding risk and cause ulcers.  You can talk with PCP about alternative pain options.  Can do tyelnol max 3000 mg a day, salon pas patches are over the counter and voltern gel is topical antiinflammatory that is safe and you can put this on your chest.   Vim s?n s??n Costochondritis  Vim s?n s??n l tnh tr?ng vim m (s?n) n?i x??ng s??n v?i x??ng ?c (x??ng ?c). Tnh tr?ng ny gy ?au ? ph?n tr??c ng?c. ?au th??ng b?t ??u t? t? v lin quan ??n nhi?u x??ng s??n. Nguyn nhn g gy ra? Tnh tr?ng ny x?y ra do chn p ln s?n ? ch? cc x??ng s??n g?n v?i x??ng ?c. Khng ph?i lc no c?ng bi?t r nguyn nhn chnh xc gy chn p. Nguyn nhn c th? l: Ch?n th??ng ng?c. T?p th? d?c ho?c ho?t ??ng. Vi?c ny c th? bao g?m nng ?? v?t. Ho r?t nhi?u. ?i?u g lm t?ng nguy c?? Qu v? d? b? tnh tr?ng ny h?n n?u: Qu v? la? n?? gi?i. Qu v? 30-40 tu?i. Qu v? m?i b?t ??u m?t bi t?p th? d?c m?i ho?c m?t cng vi?c m?i. Qu v? c n?ng ?? vitamin D th?p. Qu v? c tnh tr?ng b?nh l khi?n qu v? ho th??ng xuyn. C cc d?u hi?u ho?c tri?u ch?ng g? Tri?u ch?ng chnh c?a tnh tr?ng ny l ?au ng?c. ?au: B?t ??u t? t? v c th? bu?t nhi ho?c m ?. Tr? nn tr?m tr?ng h?n khi th? su, ho ho?c t?p luy?n. ?? khi ngh? ng?i. C th? tr?m tr?ng h?n khi qu v? ?n vo vng x??ng s??n v x??ng ?c b? ?nh h??ng. Ch?n ?on tnh tr?ng ny nh? th? no? Tnh tr?ng ny c th? ???c ch?n ?on d?a vo tri?u ch??ng, khai thc b?nh s? v khm th?c th?Shaune Pascal gia ch?m [place] s?c kh?e s? ki?m tra ch? ?au khi ?n vo x??ng ?c c?a qu v?. Qu v? c?ng c th? ???c lm cc ki?m tra ?? lo?i tr? cc nguyn nhn khc gy ?au ng?c. Cc ki?m tra ny c th? bao g?m: Ch?p X-quang ng?c. Vi?c ny c th?  ???c th?c hi?n ?? ki?m tra cc v?n ?? v? ph?i. ?i?n tm ?? (ECG). Th? thu?t ny c th? ???c th?c hi?n ?? xem qu v? c v?n ?? v? tim c th? gy ?au hay khng. Ch?p hnh ?nh. Th? thu?t ny c th? ???c th?c hi?n ?? lo?i tr? x??ng gy (gy x??ng) ? ng?c ho?c x??ng s??n c?a qu v?. Tnh tr?ng ny ???c ?i?u tr? nh? th? no? Tnh tr?ng ny c th? t? kh?i theo th?i gian. Chuyn gia ch?m  s?c kh?e c th? k ??n m?t lo?i thu?c thu?c ch?ng vim khng steroid (NSAID), nh? l ibuprofen ?? gi?m ?au v gi?m vim. ?i?u tr? c?ng c th? bao g?m: Ngh? ng?i v trnh nh?ng ho?t ??ng lm ?au tr?m tr?ng h?n. Ch??m nng ho?c ch??m ? l?nh ln vng ? ?? gi?m ?au v gi?m vim. T?p cc bi t?p ?? ko gin c? ng?c c?a qu v?. N?u cc ph??ng php ?i?u tr? ny khng c tc d?ng, chuyn gia ch?m  s?c kh?e c th? tim m?t lo?i thu?c gy t vo ch? n?i gi?a x??ng ?c v x??ng s??n. Vi?c ny  c th? gip gi?m ?au. Tun th? nh?ng h??ng d?n ny ? nh: X? tr ?au, c?ng kh?p v s?ng     N?u ???c ch? d?n, hy ch??m ? l?nh ln vng b? ?au. ?? lm vi?c ny: Cho ? l?nh vo ti ni lng. ?? kh?n t?m ? gi?a da v ti ch??m. Ch??m ? l?nh trong 20 pht, 2-3 l?n m?i ngy. N?u ???c chi? d?n, ha?y ch???m no?ng va?o vu?ng bi? ?au th??ng xuyn theo chi? d?n cu?a chuyn gia ch?m so?c s??c kho?e. S? d?ng ngu?n nhi?t m chuyn gia ch?m [place] s?c kh?e khuy?n ngh?, ch?ng h?n nh? ti ch??m nhi?t ?m ho?c mi?ng ??m ch??m nng. ?? kh?n t?m ? gi?a da v ngu?n nhi?t. Duy tr ngu?n nhi?t trong 20-30 pht. N?u da qu v? chuy?n sang mu ?? t??i, hy b? ngu?n nhi?t ho?c ? l?nh ra ngay l?p t?c ?? ng?n ng?a t?n th??ng da. Nguy c? t?n th??ng da cao h?n n?u qu v? khng th? c?m th?y ?au, nng ho?c l?nh. Ho?t ??ng Ngh? ng?i theo ch? d?n c?a chuyn gia ch?m [place] s?c kh?e. Trnh nh?ng ho?t ??ng lm ?au tr?m tr?ng h?n. Vi?c ny bao g?m cc ho?t ??ng s? d?ng cc c? ? ng?c, b?ng v m?n s??n. Qu v? c th? ph?i trnh nng v?t n?ng. Hy h?i chuyn gia ch?m [place]  s?c kh?e xem qu v? c th? nng v?t n?ng bao nhiu ?? ??m b?o an ton. Tr? l?i sinh ho?t bnh th??ng theo ch? d?n c?a chuyn gia ch?m [place] s?c kh?e. Hy h?i chuyn gia ch?m [place] s?c kh?e v? cc ho?t ??ng no l an ton cho qu v?. H??ng d?n chung Ch? s? d?ng thu?c khng k ??n v thu?c k ??n theo ch? d?n c?a chuyn gia ch?m [place] s?c kh?e. Hy lin l?c v?i chuyn gia ch?m [place] s?c kh?e n?u: Qu v? b? ?n l?nh ho?c s?t. ?au khng ?? ?au, ho?c tr? nn tr?m tr?ng h?n. Qu v? b? ho m khng h?t. Yu c?u tr? gip ngay l?p t?c n?u: Qu v? th?y kh th?. Quy? vi? bi? ?au ng?c d? d?i khng ??? sau khi du?ng thu?c, ch??m nng ho?c ch??m l?nh. Nh?ng tri?u ch?ng ny c th? l tr??ng h?p c?p c?u. Yu c?u tr? gip ngay l?p t?c. Hy g?i 911. Khng ch? xem tri?u ch?ng c h?t khng. Khng t? li xe ??n b?nh vi?n. Thng tin ny khng nh?m m?c ?ch thay th? cho l?i khuyn m chuyn gia ch?m [place] s?c kh?e ni v?i qu v?. Hy b?o ??m qu v? ph?i th?o lu?n b?t k? v?n ?? g m qu v? c v?i chuyn gia ch?m [place] s?c kh?e c?a qu v?. Document Revised: 05/27/2022 Document Reviewed: 05/27/2022 Elsevier Patient Education  2023 [employer] Inc.  You have been scheduled for a Barium Esophogram at [facility] (1st floor of the hospital) on 02/11/2023 at 11 AM. Please arrive 30 minutes prior to your appointment for registration. Make certain not to have anything to eat or drink 3 hours prior to your test. If you need to reschedule for any reason, please contact radiology at [phone] to do so. __________________________________________________________________ A barium swallow is an examination that concentrates on views of the esophagus. This tends to be a double contrast exam (barium and two liquids which, when combined, create a gas to distend the wall of the oesophagus) or single contrast (non-ionic iodine based). The study is usually tailored to your symptoms so a good history is essential. Attention is paid during the  study to the form, structure and configuration of the esophagus, looking for functional disorders (such as aspiration, dysphagia, achalasia, motility and reflux) EXAMINATION You may be asked to change into a gown, depending on the type of swallow being performed. A radiologist and radiographer will perform the procedure. The radiologist will advise you of the type of contrast selected for your procedure and direct you during the exam. You will be asked to stand, sit or lie in several different positions and to hold a small amount of fluid in your mouth before being asked to swallow while the imaging is performed .In some instances you may be asked to swallow barium coated marshmallows to assess the motility of a solid food bolus. The exam can be recorded as a digital or video fluoroscopy procedure. POST PROCEDURE It will take 1-2 days for the barium to pass through your system. To facilitate this, it is important, unless otherwise directed, to increase your fluids for the next 24-48hrs and to resume your normal diet.  This test typically takes about 30 minutes to perform. __________________________________________________________________________________   Bonita Quin have been scheduled for an abdominal ultrasound at [facility] (1st floor of hospital) on 02/11/2023 at 9:30. Please arrive 30 minutes prior to your appointment for registration. Make certain not to have anything to eat or drink 6 hours prior to your appointment. Should you need to reschedule your appointment, please contact radiology at [phone]. This test typically takes about 60 minutes to perform.  Due to recent changes in healthcare laws, you may see the results of your imaging and laboratory studies on MyChart before your provider has had a chance to review them.  We understand that in some cases there may be results that are confusing or concerning to you. Not all laboratory results come back in the same time frame and the  provider may be waiting for multiple results in order to interpret others.  Please give [place] 48 hours in order for your provider to thoroughly review all the results before contacting the office for clarification of your results.    I appreciate the  opportunity to care for you  Thank You   [facility]

## 2023-02-04 ENCOUNTER — Other Ambulatory Visit: Payer: Self-pay | Admitting: Family Medicine

## 2023-02-04 DIAGNOSIS — M542 Cervicalgia: Secondary | ICD-10-CM

## 2023-02-04 DIAGNOSIS — M25562 Pain in left knee: Secondary | ICD-10-CM

## 2023-02-04 DIAGNOSIS — M7050 Other bursitis of knee, unspecified knee: Secondary | ICD-10-CM

## 2023-02-04 DIAGNOSIS — M5441 Lumbago with sciatica, right side: Secondary | ICD-10-CM

## 2023-02-04 DIAGNOSIS — M79671 Pain in right foot: Secondary | ICD-10-CM

## 2023-02-11 ENCOUNTER — Ambulatory Visit (HOSPITAL_COMMUNITY)
Admission: RE | Admit: 2023-02-11 | Discharge: 2023-02-11 | Disposition: A | Discharge status: Home / Self Care | Payer: BC Managed Care – PPO | Source: Ambulatory Visit | Attending: Physician Assistant | Admitting: Physician Assistant

## 2023-02-11 DIAGNOSIS — R1310 Dysphagia, unspecified: Secondary | ICD-10-CM | POA: Diagnosis not present

## 2023-02-11 DIAGNOSIS — R1011 Right upper quadrant pain: Secondary | ICD-10-CM | POA: Diagnosis not present

## 2023-02-11 DIAGNOSIS — R1319 Other dysphagia: Secondary | ICD-10-CM

## 2023-02-13 ENCOUNTER — Telehealth: Payer: Self-pay | Admitting: Physician Assistant

## 2023-02-13 NOTE — Telephone Encounter (Signed)
See result note.  

## 2023-02-13 NOTE — Telephone Encounter (Signed)
PT daughter is returning call to discuss results. Please advise.

## 2023-03-12 ENCOUNTER — Other Ambulatory Visit: Payer: BC Managed Care – PPO

## 2023-03-21 ENCOUNTER — Ambulatory Visit
Admission: RE | Admit: 2023-03-21 | Discharge: 2023-03-21 | Disposition: A | Discharge status: Home / Self Care | Payer: BC Managed Care – PPO | Source: Ambulatory Visit | Attending: Sports Medicine | Admitting: Sports Medicine

## 2023-03-21 DIAGNOSIS — M5126 Other intervertebral disc displacement, lumbar region: Secondary | ICD-10-CM | POA: Diagnosis not present

## 2023-03-21 DIAGNOSIS — M48061 Spinal stenosis, lumbar region without neurogenic claudication: Secondary | ICD-10-CM | POA: Diagnosis not present

## 2023-03-21 DIAGNOSIS — G8929 Other chronic pain: Secondary | ICD-10-CM

## 2023-03-21 DIAGNOSIS — M47816 Spondylosis without myelopathy or radiculopathy, lumbar region: Secondary | ICD-10-CM | POA: Diagnosis not present

## 2023-05-29 ENCOUNTER — Other Ambulatory Visit: Payer: Self-pay | Admitting: Physician Assistant

## 2023-05-29 DIAGNOSIS — R1319 Other dysphagia: Secondary | ICD-10-CM

## 2023-05-29 DIAGNOSIS — K219 Gastro-esophageal reflux disease without esophagitis: Secondary | ICD-10-CM

## 2023-06-19 ENCOUNTER — Encounter: Payer: Self-pay | Admitting: Family Medicine

## 2023-06-19 ENCOUNTER — Other Ambulatory Visit: Payer: Self-pay

## 2023-06-19 ENCOUNTER — Ambulatory Visit: Payer: BC Managed Care – PPO | Admitting: Family Medicine

## 2023-06-19 VITALS — BP 110/80 | HR 71 | Temp 97.3°F | Ht 60.0 in | Wt 128.6 lb

## 2023-06-19 DIAGNOSIS — M48062 Spinal stenosis, lumbar region with neurogenic claudication: Secondary | ICD-10-CM | POA: Diagnosis not present

## 2023-06-19 DIAGNOSIS — R1319 Other dysphagia: Secondary | ICD-10-CM

## 2023-06-19 DIAGNOSIS — M5136 Other intervertebral disc degeneration, lumbar region: Secondary | ICD-10-CM | POA: Diagnosis not present

## 2023-06-19 DIAGNOSIS — K219 Gastro-esophageal reflux disease without esophagitis: Secondary | ICD-10-CM

## 2023-06-19 MED ORDER — PANTOPRAZOLE SODIUM 40 MG PO TBEC: 40.0000 mg | DELAYED_RELEASE_TABLET | Freq: Every day | ORAL | 0 refills | Status: AC

## 2023-06-19 NOTE — Progress Notes (Signed)
Established Patient Office Visit   Subjective  Patient ID: Maria Garrison, female    DOB: 1966-04-30  Age: 57 y.o. MRN: [id number]  Chief Complaint  Patient presents with   Back Pain    Patient came in today for lower back pain follow-up, MRI results,   Patient accompanied by her daughter and [place] interpreter.  Pt is 57 yo female seen for f/u.  Pt had an MRI with [facility] and was confused as to who to follow up with regarding results.  Per chart review MRI 03/21/23 with spinal stenosis and bulging disc.  Per result note a follow-up appointment to discuss results and next steps was to be scheduled.  Patient notes continued fatigue and aches.  Patient does not want surgery.  Per patient's daughter surgery was previously recommended for back symptoms.    Past Medical History:  Diagnosis Date   Abdominal pain in female    For 3-4 years   COVID-19 10/11/2019   Heart murmur    Microscopic hematuria 2021   Spinal stenosis    Urinary incontinence    Past Surgical History:  Procedure Laterality Date   COLONOSCOPY     ESOPHAGOGASTRODUODENOSCOPY     HEMORROIDECTOMY     Social History   Tobacco Use   Smoking status: Never   Smokeless tobacco: Never  Vaping Use   Vaping status: Never Used  Substance Use Topics   Alcohol use: No   Drug use: No   Family History  Problem Relation Age of Onset   Other Mother        Lung Disease??   Cirrhosis Father    Colon cancer Neg Hx    Colon polyps Neg Hx    Esophageal cancer Neg Hx    Pancreatic cancer Neg Hx    Stomach cancer Neg Hx    No Known Allergies    ROS Negative unless stated above    Objective:     BP 110/80 (BP Location: Left Arm, Patient Position: Sitting, Cuff Size: Normal)   Pulse 71   Temp (!) 97.3 F (36.3 C) (Oral)   Ht 5' (1.524 m)   Wt 128 lb 9.6 oz (58.3 kg)   LMP 12/19/2019 (Approximate)   SpO2 99%   BMI 25.12 kg/m    Physical Exam Constitutional:      General: She is not in acute distress.     Appearance: Normal appearance.  HENT:     Head: Normocephalic and atraumatic.     Nose: Nose normal.     Mouth/Throat:     Mouth: Mucous membranes are moist.  Eyes:     Extraocular Movements: Extraocular movements intact.     Conjunctiva/sclera: Conjunctivae normal.  Cardiovascular:     Rate and Rhythm: Normal rate and regular rhythm.  Pulmonary:     Effort: Pulmonary effort is normal.  Skin:    General: Skin is warm and dry.  Neurological:     Mental Status: She is alert and oriented to person, place, and time. Mental status is at baseline.      No results found for any visits on 06/19/23.    Assessment & Plan:  Degenerative disc disease, lumbar -     Ambulatory referral to Pain Clinic  Spinal stenosis of lumbar region with neurogenic claudication -     Ambulatory referral to Pain Clinic  Patient seen for follow-up on MRI lumbar spine without contrast.  MRI lumbar spine 03/21/2023 with broad-based disc bulge at L4-5 which may impinge  upon bilateral intraspinal L5 nerve roots.  Mild bilateral facet arthropathy.  Severe spinal stenosis.  Mild-moderate bilateral foraminal stenosis.  At L5-S1 mild disc bulge with bilateral facet arthropathy mild-moderate bilateral foraminal stenosis.  L2-3 disc bulge with shallow right paracentral disc protrusion, mild bilateral facet arthropathy.  Patient advised to follow-up with Madelyn Brunner, DO, ordering provider.  Continue supportive care with OTC medications, prescription medications, stretching, heat, etc.  Patient requesting referral to pain management while waiting on follow-up appointment.  No follow-ups on file.   Deeann Saint, MD

## 2023-07-02 DIAGNOSIS — Z23 Encounter for immunization: Secondary | ICD-10-CM | POA: Diagnosis not present

## 2023-07-15 ENCOUNTER — Encounter: Payer: Self-pay | Admitting: Sports Medicine

## 2023-07-15 ENCOUNTER — Ambulatory Visit: Payer: BC Managed Care – PPO | Admitting: Sports Medicine

## 2023-07-15 DIAGNOSIS — M5441 Lumbago with sciatica, right side: Secondary | ICD-10-CM

## 2023-07-15 DIAGNOSIS — M48062 Spinal stenosis, lumbar region with neurogenic claudication: Secondary | ICD-10-CM | POA: Diagnosis not present

## 2023-07-15 DIAGNOSIS — M4306 Spondylolysis, lumbar region: Secondary | ICD-10-CM | POA: Diagnosis not present

## 2023-07-15 DIAGNOSIS — M5442 Lumbago with sciatica, left side: Secondary | ICD-10-CM | POA: Diagnosis not present

## 2023-07-15 DIAGNOSIS — G8929 Other chronic pain: Secondary | ICD-10-CM

## 2023-07-15 MED ORDER — MELOXICAM 15 MG PO TABS: 15.0000 mg | ORAL_TABLET | Freq: Every day | ORAL | 1 refills | Status: DC

## 2023-07-15 NOTE — Progress Notes (Signed)
Patient is here to review MRI results.

## 2023-07-15 NOTE — Progress Notes (Signed)
Maria Garrison - 57 y.o. female MRN [id number]  Date of birth: 10-May-1966  Office Visit Note: Visit Date: 07/15/2023 PCP: Deeann Saint, MD Referred by: Deeann Saint, MD  Subjective: Chief Complaint  Patient presents with   Lower Back - Follow-up   HPI: Maria Garrison is a pleasant 57 y.o. female who presents today for follow-up of low back pain with MRI review.  Patient's daughter was present during the visit and did help provide some of the HPI.  She has had chronic low back pain for many months.  Her pain will radiate down both legs that time, although right is worse than left.  Her pain is exacerbated with standing long periods of time at work.  She does take meloxicam 15 mg once daily as needed and this does help somewhat with her pain.  We did obtain an MRI of the lumbar spine back in June, here to follow-up on that as well.  At times the pain will radiate all the way down her legs sometimes to the tops of her foot.  Pertinent ROS were reviewed with the patient and found to be negative unless otherwise specified above in HPI.   Assessment & Plan: Visit Diagnoses:  1. Chronic midline low back pain with bilateral sciatica   2. Spinal stenosis of lumbar region with neurogenic claudication   3. Spondylolysis, lumbar region    Plan: Discussed with Desare the nature of her acute on chronic low back pain with radiculopathy.  She has a few conditions that are contributing.  Review of her MRI does show a somewhat prominent central disc protrusion at the L4-L5 level.  She also has moderate to severe spinal stenosis.  She does seem symptomatic from this as she has pain with extension as well as prolonged standing or walking.  Neurologically she is intact other than she does have some weakness with 4/5 strength of great toe extension on the right only.  She did tell me today she rather not pursue surgical treatment, which I completely understand.  I think at this point we need to get her into  formalized physical therapy.  I also would like to send her to my partner, Dr. Alvester Morin, for lumbar ESI for the L5 IL.  Could consider additional injections at his discretion given her spinal stenosis.  I would like to see her back a few weeks after this to reassess her pain as well as her lower extremity strength.  If she continues to have pain and lower extremity weakness, I did discuss this and indication for at least surgical referral.  Will hold on this for now.  Did refill her meloxicam 15 mg which she may take once daily.   Follow-up: Return for f/u about 2-3 weeks after injection with Dr. Alvester Morin .   Meds & Orders:  Meds ordered this encounter  Medications   meloxicam (MOBIC) 15 MG tablet    Sig: Take 1 tablet (15 mg total) by mouth daily.    Dispense:  30 tablet    Refill:  1    Orders Placed This Encounter  Procedures   Ambulatory referral to Physical Medicine Rehab   Ambulatory referral to Physical Therapy     Procedures: No procedures performed      Clinical History: MRI LUMBAR SPINE WITHOUT CONTRAST   TECHNIQUE: Multiplanar, multisequence MR imaging of the lumbar spine was performed. No intravenous contrast was administered.   COMPARISON:  None Available.   FINDINGS: Segmentation:  Standard.  Alignment:  Physiologic.   Vertebrae: No acute fracture, evidence of discitis, or aggressive bone lesion.   Conus medullaris and cauda equina: Conus extends to the L1-2 level. Conus and cauda equina appear normal.   Paraspinal and other soft tissues: No acute paraspinal abnormality.   Disc levels:   Disc spaces: Disc desiccation at L2-3, L3-4, L4-5 and L5-S1 with minimal disc height loss at L5-S1.   T12-L1: No significant disc bulge. Mild bilateral facet arthropathy. No foraminal or central canal stenosis.   L1-L2: No significant disc bulge. Mild bilateral facet arthropathy. No foraminal or central canal stenosis.   L2-L3: Broad-based disc bulge with a broad  shallow right paracentral disc protrusion. Mild bilateral facet arthropathy. No foraminal or central canal stenosis.   L3-L4: Mild broad-based disc bulge. Mild bilateral facet arthropathy. No foraminal or central canal stenosis.   L4-L5: Broad-based disc bulge with a broad central disc protrusion which may impinge upon bilateral intraspinal L5 nerve roots. Bilateral subarticular recess stenosis. Mild bilateral facet arthropathy. Severe spinal stenosis. Mild-moderate bilateral foraminal stenosis.   L5-S1: Mild broad-based disc bulge. Mild bilateral facet arthropathy. Mild-moderate bilateral foraminal stenosis. No spinal stenosis.   IMPRESSION: 1. At L4-5 there is a broad-based disc bulge with a broad central disc protrusion which may impinge upon bilateral intraspinal L5 nerve roots. Bilateral subarticular recess stenosis. Mild bilateral facet arthropathy. Severe spinal stenosis. Mild-moderate bilateral foraminal stenosis. 2. At L5-S1 there is a mild broad-based disc bulge. Mild bilateral facet arthropathy. Mild-moderate bilateral foraminal stenosis. 3. At L2-3 there is a broad-based disc bulge with a broad shallow right paracentral disc protrusion. Mild bilateral facet arthropathy. 4. No acute osseous injury of the lumbar spine.     Electronically Signed   By: Elige Ko M.D.   On: 03/30/2023 09:55  She reports that she has never smoked. She has never used smokeless tobacco. No results for input(s): "HGBA1C", "LABURIC" in the last 8760 hours.  Objective:   Vital Signs: LMP 12/19/2019 (Approximate)   Physical Exam  Gen: Well-appearing, in no acute distress; non-toxic CV: Well-perfused. Warm.  Resp: Breathing unlabored on room air; no wheezing. Psych: Fluid speech in conversation; appropriate affect; normal thought process Neuro: Sensation intact throughout. No gross coordination deficits.   Ortho Exam - Lumbar: There is no midline spinous process TTP.  Patient has  worsening of pain with extension, her pain is relieved slightly with flexion.  2+ Achilles and patellar DTR bilaterally.  Negative modified slump's test.  She does have 4/5 strength with great toe extension on the right compared to the left.  Otherwise has 5/5 strength of bilateral lower extremities in the L4-S1 nerve root distribution.  Imaging:  MR Lumbar Spine w/o contrast CLINICAL DATA:  Low back pain for over 6 weeks. Pain to the right leg and foot.  EXAM: MRI LUMBAR SPINE WITHOUT CONTRAST  TECHNIQUE: Multiplanar, multisequence MR imaging of the lumbar spine was performed. No intravenous contrast was administered.  COMPARISON:  None Available.  FINDINGS: Segmentation:  Standard.  Alignment:  Physiologic.  Vertebrae: No acute fracture, evidence of discitis, or aggressive bone lesion.  Conus medullaris and cauda equina: Conus extends to the L1-2 level. Conus and cauda equina appear normal.  Paraspinal and other soft tissues: No acute paraspinal abnormality.  Disc levels:  Disc spaces: Disc desiccation at L2-3, L3-4, L4-5 and L5-S1 with minimal disc height loss at L5-S1.  T12-L1: No significant disc bulge. Mild bilateral facet arthropathy. No foraminal or central canal stenosis.  L1-L2: No significant  disc bulge. Mild bilateral facet arthropathy. No foraminal or central canal stenosis.  L2-L3: Broad-based disc bulge with a broad shallow right paracentral disc protrusion. Mild bilateral facet arthropathy. No foraminal or central canal stenosis.  L3-L4: Mild broad-based disc bulge. Mild bilateral facet arthropathy. No foraminal or central canal stenosis.  L4-L5: Broad-based disc bulge with a broad central disc protrusion which may impinge upon bilateral intraspinal L5 nerve roots. Bilateral subarticular recess stenosis. Mild bilateral facet arthropathy. Severe spinal stenosis. Mild-moderate bilateral foraminal stenosis.  L5-S1: Mild broad-based disc bulge. Mild  bilateral facet arthropathy. Mild-moderate bilateral foraminal stenosis. No spinal stenosis.  IMPRESSION: 1. At L4-5 there is a broad-based disc bulge with a broad central disc protrusion which may impinge upon bilateral intraspinal L5 nerve roots. Bilateral subarticular recess stenosis. Mild bilateral facet arthropathy. Severe spinal stenosis. Mild-moderate bilateral foraminal stenosis. 2. At L5-S1 there is a mild broad-based disc bulge. Mild bilateral facet arthropathy. Mild-moderate bilateral foraminal stenosis. 3. At L2-3 there is a broad-based disc bulge with a broad shallow right paracentral disc protrusion. Mild bilateral facet arthropathy. 4. No acute osseous injury of the lumbar spine.  Electronically Signed   By: Elige Ko M.D.   On: 03/30/2023 09:55  Past Medical/Family/Surgical/Social History: Medications & Allergies reviewed per EMR, new medications updated. Patient Active Problem List   Diagnosis Date Noted   Microscopic hematuria 07/19/2020   Gastroesophageal reflux disease 07/19/2020   Constipation 07/19/2020   Chronic generalized abdominal pain 07/19/2020   Spinal stenosis 12/19/2017   Pelvic pain in female 10/04/2015   Dyspareunia in female 10/04/2015   Encounter for routine gynecological examination 10/04/2015   Abdominal pain, chronic, right lower quadrant 07/27/2015   Past Medical History:  Diagnosis Date   Abdominal pain in female    For 3-4 years   COVID-19 10/11/2019   Heart murmur    Microscopic hematuria 2021   Spinal stenosis    Urinary incontinence    Family History  Problem Relation Age of Onset   Other Mother        Lung Disease??   Cirrhosis Father    Colon cancer Neg Hx    Colon polyps Neg Hx    Esophageal cancer Neg Hx    Pancreatic cancer Neg Hx    Stomach cancer Neg Hx    Past Surgical History:  Procedure Laterality Date   COLONOSCOPY     ESOPHAGOGASTRODUODENOSCOPY     HEMORROIDECTOMY     Social History    Occupational History   Not on file  Tobacco Use   Smoking status: Never   Smokeless tobacco: Never  Vaping Use   Vaping status: Never Used  Substance and Sexual Activity   Alcohol use: No   Drug use: No   Sexual activity: Yes    Partners: Male    Birth control/protection: None

## 2023-07-22 ENCOUNTER — Telehealth: Payer: Self-pay

## 2023-07-22 NOTE — Telephone Encounter (Signed)
Left detailed voicemail explaining that Dr. Alvester Morin will be giving the injection and to return call to schedule

## 2023-07-22 NOTE — Telephone Encounter (Signed)
-----   Message from Naaman Plummer sent at 07/22/2023  8:14 AM EDT ----- Regarding: RE: Injection Appt for Patient Maria Garrison, looks like you called and left voicemail on 10/11. May be language issue? Please call her and get scheduled then try to facilitate her f/up appointment with him  Thanks ----- Message ----- From: Madelyn Brunner, DO Sent: 07/21/2023   5:28 PM EDT To: Tyrell Antonio, MD; Juanda Chance, NP; # Subject: Injection Appt for Patient                     [facility] and team,  This patient made an appointment with me next week for back injection. I think there was some miscommunication, because she is supposed to see Dr. Alvester Morin and team for the back injection and then follow-up with me afterward. Her appt with me now is 10/22 and she has not had back injection scheduled yet -- it does look authorized.   Can we contact patient and move this appt to get her scheduled with Dr. Alvester Morin first? Then she can see me about 2 weeks after he is done with his injections.   Thanks! Annabelle Harman

## 2023-07-28 ENCOUNTER — Encounter: Payer: Self-pay | Admitting: Physical Therapy

## 2023-07-28 ENCOUNTER — Other Ambulatory Visit: Payer: Self-pay

## 2023-07-28 ENCOUNTER — Ambulatory Visit (INDEPENDENT_AMBULATORY_CARE_PROVIDER_SITE_OTHER): Payer: BC Managed Care – PPO | Admitting: Physical Therapy

## 2023-07-28 DIAGNOSIS — R29898 Other symptoms and signs involving the musculoskeletal system: Secondary | ICD-10-CM | POA: Diagnosis not present

## 2023-07-28 DIAGNOSIS — M6281 Muscle weakness (generalized): Secondary | ICD-10-CM | POA: Diagnosis not present

## 2023-07-28 DIAGNOSIS — M5459 Other low back pain: Secondary | ICD-10-CM | POA: Diagnosis not present

## 2023-07-28 NOTE — Therapy (Signed)
OUTPATIENT PHYSICAL THERAPY EVALUATION   Patient Name: Maria Garrison MRN: [id number] DOB:Mar 06, 1966, 57 y.o., female Today's Date: 07/28/2023  END OF SESSION:  PT End of Session - 07/28/23 0901     Visit Number 1    Number of Visits 6    Date for PT Re-Evaluation 09/08/23    Authorization Type [employer] $70 copay; 30 visit limit    PT Start Time 0802    PT Stop Time 0842    PT Time Calculation (min) 40 min    Activity Tolerance Patient tolerated treatment well    Behavior During Therapy [facility] for tasks assessed/performed             Past Medical History:  Diagnosis Date   Abdominal pain in female    For 3-4 years   COVID-19 10/11/2019   Heart murmur    Microscopic hematuria 2021   Spinal stenosis    Urinary incontinence    Past Surgical History:  Procedure Laterality Date   COLONOSCOPY     ESOPHAGOGASTRODUODENOSCOPY     HEMORROIDECTOMY     Patient Active Problem List   Diagnosis Date Noted   Microscopic hematuria 07/19/2020   Gastroesophageal reflux disease 07/19/2020   Constipation 07/19/2020   Chronic generalized abdominal pain 07/19/2020   Spinal stenosis 12/19/2017   Pelvic pain in female 10/04/2015   Dyspareunia in female 10/04/2015   Encounter for routine gynecological examination 10/04/2015   Abdominal pain, chronic, right lower quadrant 07/27/2015    PCP: Deeann Saint, MD  REFERRING PROVIDER: Madelyn Brunner, DO  REFERRING DIAG: [phone] (ICD-10-CM) - Spinal stenosis of lumbar region with neurogenic claudication  Rationale for Evaluation and Treatment: Rehabilitation  THERAPY DIAG:  Other low back pain - Plan: PT plan of care cert/re-cert  Muscle weakness (generalized) - Plan: PT plan of care cert/re-cert  Other symptoms and signs involving the musculoskeletal system - Plan: PT plan of care cert/re-cert  ONSET DATE: 2 years ago with exacerbation x 3 months   SUBJECTIVE:                                                                                                                                                                                            SUBJECTIVE STATEMENT: She has had chronic low back pain for many months without known injury.  She had an injection about 2 years ago and about 3 months ago her pain started to return.. Her pain will radiate down both legs that time, although right is worse than left. Her pain is exacerbated with standing long periods of time at work.  PERTINENT HISTORY:  Spinal stenosis  PAIN:  Are you having pain? Yes: NPRS scale: 8 currently, up to 8, at best 3-4/10 Pain location: low back; Lt worse than Rt Pain description: strong ache, intermittent burning Aggravating factors: standing, forward bending, and arching Relieving factors: standing up straight  PRECAUTIONS:  None  RED FLAGS: None   WEIGHT BEARING RESTRICTIONS:  No  FALLS:  Has patient fallen in last 6 months? 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  She also has moderate to severe spinal stenosis.  She does seem symptomatic from this as she has pain with extension as well as prolonged standing or walking  PATIENT SURVEYS:  07/28/23 FOTO 60 (predicted 70)  COGNITIVE STATUS: Within functional limits for tasks assessed   SENSATION: WFL  POSTURE:  rounded shoulders and forward head   GAIT: 07/28/23 Comments: independent with ambulation   PALPATION: 07/28/23 tender to palpation L3-S1 and bil SIJ joints; tightness in piriformis bil   LUMBAR ROM:   Active  A/PROM  eval  Flexion  Limited 10% with pain  Extension WNL with pain  Right quadrant WNL with Lt side pain  Left quadrant WNL no pain   (Blank rows = not tested)     LOWER EXTREMITY MMT:  giveway weakness noted with hip flexion  MMT Right eval Left eval  Hip flexion 3+/5 3+/5  Hip extension 3/5 3/5  Hip abduction    Hip adduction    Hip internal rotation    Hip external rotation    Knee flexion 5/5 5/5  Knee extension 5/5 5/5  Ankle dorsiflexion 5/5 5/5  Ankle plantarflexion    Ankle inversion    Ankle eversion     (Blank rows = not tested)    SPECIAL TESTS:  07/28/23 Lumbar Slump test: increased pain on Rt with decreased neural tension; positive on Lt  TREATMENT:                                                                                                                              DATE:  07/28/23 See HEP - demonstrated with trial reps performed PRN for comprehension      PATIENT EDUCATION:  Education details: HEP Person educated: Patient Education method: Programmer, multimedia, Demonstration, and Handouts Education comprehension: verbalized understanding, returned demonstration, and needs further education  HOME EXERCISE PROGRAM: Access Code: [email or web address] URL: [email or web address] Date: 07/28/2023 Prepared by: Moshe Cipro  Exercises - Hooklying Single Knee to Chest  - 2 x daily - 7 x weekly - 1 sets - 3 reps - 30 sec hold - Supine  Piriformis Stretch with Foot on Ground  - 2 x daily - 7 x weekly - 1 sets - 3 reps - 30 sec hold - Supine Posterior Pelvic Tilt  - 2 x daily - 7 x weekly - 1 sets - 10 reps - 5 sec hold - Supine Bridge  - 2 x daily - 7 x weekly - 1 sets - 10 reps - 5 sec hold   ASSESSMENT:  CLINICAL IMPRESSION: Patient is a 57 y.o. female who was seen today for physical therapy evaluation and treatment for LBP. She demonstrates decreased strength and flexibility as well as continued pain affecting functional mobility.  She will benefit from PT to address  deficits listed.   OBJECTIVE IMPAIRMENTS: decreased ROM, decreased strength, hypomobility, increased fascial restrictions, increased muscle spasms, postural dysfunction, and pain.   ACTIVITY LIMITATIONS: carrying, lifting, bending, standing, and locomotion level  PARTICIPATION LIMITATIONS: meal prep, cleaning, laundry, driving, shopping, community activity, occupation, and yard work  PERSONAL FACTORS: 1 comorbidity: spinal stenosis  are also affecting patient's functional outcome.   REHAB POTENTIAL: Good  CLINICAL DECISION MAKING: Evolving/moderate complexity  EVALUATION COMPLEXITY: Moderate   GOALS: Goals reviewed with patient? Yes  SHORT TERM GOALS: Target date: 08/18/2023   Independent with initial HEP Goal status: INITIAL    LONG TERM GOALS: Target date: 09/08/2023  Independent with final HEP Goal status: INITIAL  2.  FOTO score improved to 70 Goal status: INITIAL  3.  Bil hip strength improved to 4+/5 for improved function and mobility Goal status: INIITAL  4.  Report pain < 4/10 with standing and work responsibilities for improved function Goal status: INITIAL     PLAN:  PT FREQUENCY: 1x/week (recommend 1x/wk, will see every other week at pt's request)  PT DURATION: 6 weeks  PLANNED INTERVENTIONS: 97164- PT Re-evaluation, 97110-Therapeutic exercises, 97530- Therapeutic activity, 97112- Neuromuscular re-education, 97535- Self Care, 91478- Manual therapy, [id number]- Aquatic Therapy, 97014- Electrical stimulation (unattended), [id number]- Traction (mechanical), Patient/Family education, Balance training, Taping, Dry Needling, Joint mobilization, Spinal manipulation, Spinal mobilization, Cryotherapy, and Moist heat.  PLAN FOR NEXT SESSION: review HEP, manual/modalities/DN PRN for pain; hip/core strengthening   NEXT MD VISIT: Not yet scheduled   Clarita Crane, PT, DPT 07/28/23 9:02 AM

## 2023-07-29 ENCOUNTER — Ambulatory Visit: Payer: BC Managed Care – PPO | Admitting: Physical Medicine and Rehabilitation

## 2023-07-29 ENCOUNTER — Ambulatory Visit: Payer: BC Managed Care – PPO | Admitting: Sports Medicine

## 2023-07-29 ENCOUNTER — Other Ambulatory Visit: Payer: Self-pay

## 2023-07-29 DIAGNOSIS — M5416 Radiculopathy, lumbar region: Secondary | ICD-10-CM | POA: Diagnosis not present

## 2023-07-29 MED ORDER — METHYLPREDNISOLONE ACETATE 40 MG/ML IJ SUSP
40.0000 mg | Freq: Once | INTRAMUSCULAR | Status: AC
  Administered 2023-07-29: 40 mg

## 2023-07-29 NOTE — Patient Instructions (Signed)

## 2023-07-29 NOTE — Progress Notes (Signed)
Functional Pain Scale - descriptive words and definitions  Intense (8)    Cannot complete any ADLs without much assistance/cannot concentrate/conversation is difficult/unable to sleep and unable to use distraction. Severe range order  Average Pain 8  122/79 +Driver, -BT, -Dye Allergies.

## 2023-08-06 NOTE — Progress Notes (Signed)
Maria Garrison - 57 y.o. female MRN [id number]  Date of birth: 07/01/1966  Office Visit Note: Visit Date: 07/29/2023 PCP: Deeann Saint, MD Referred by: Madelyn Brunner, DO  Subjective: Chief Complaint  Patient presents with   Lower Back - Pain   HPI:  Maria Garrison is a 57 y.o. female who comes in today at the request of Dr. Madelyn Brunner for planned Left L5-S1 Lumbar Interlaminar epidural steroid injection with fluoroscopic guidance.  The patient has failed conservative care including home exercise, medications, time and activity modification.  This injection will be diagnostic and hopefully therapeutic.  Please see requesting physician notes for further details and justification.   ROS Otherwise per HPI.  Assessment & Plan: Visit Diagnoses:    ICD-10-CM   1. Lumbar radiculopathy  M54.16 XR C-ARM NO REPORT    Epidural Steroid injection    methylPREDNISolone acetate (DEPO-MEDROL) injection 40 mg      Plan: No additional findings.   Meds & Orders:  Meds ordered this encounter  Medications   methylPREDNISolone acetate (DEPO-MEDROL) injection 40 mg    Orders Placed This Encounter  Procedures   XR C-ARM NO REPORT   Epidural Steroid injection    Follow-up: Return for visit to requesting provider as needed.   Procedures: No procedures performed  Lumbar Epidural Steroid Injection - Interlaminar Approach with Fluoroscopic Guidance  Patient: Maria Garrison      Date of Birth: [birth year] MRN: [id number] PCP: Deeann Saint, MD      Visit Date: 07/29/2023   Universal Protocol:     Consent Given By: the patient  Position: PRONE  Additional Comments: Vital signs were monitored before and after the procedure. Patient was prepped and draped in the usual sterile fashion. The correct patient, procedure, and site was verified.   Injection Procedure Details:   Procedure diagnoses: Lumbar radiculopathy [M54.16]   Meds Administered:  Meds ordered this encounter  Medications    methylPREDNISolone acetate (DEPO-MEDROL) injection 40 mg     Laterality: Left  Location/Site:  L5-S1  Needle: 3.5 in., 20 ga. Tuohy  Needle Placement: Paramedian epidural  Findings:   -Comments: Excellent flow of contrast into the epidural space.  Procedure Details: Using a paramedian approach from the side mentioned above, the region overlying the inferior lamina was localized under fluoroscopic visualization and the soft tissues overlying this structure were infiltrated with 4 ml. of 1% Lidocaine without Epinephrine. The Tuohy needle was inserted into the epidural space using a paramedian approach.   The epidural space was localized using loss of resistance along with counter oblique bi-planar fluoroscopic views.  After negative aspirate for air, blood, and CSF, a 2 ml. volume of Isovue-250 was injected into the epidural space and the flow of contrast was observed. Radiographs were obtained for documentation purposes.    The injectate was administered into the level noted above.   Additional Comments:  No complications occurred Dressing: 2 x 2 sterile gauze and Band-Aid    Post-procedure details: Patient was observed during the procedure. Post-procedure instructions were reviewed.  Patient left the clinic in stable condition.   Clinical History: MRI LUMBAR SPINE WITHOUT CONTRAST   TECHNIQUE: Multiplanar, multisequence MR imaging of the lumbar spine was performed. No intravenous contrast was administered.   COMPARISON:  None Available.   FINDINGS: Segmentation:  Standard.   Alignment:  Physiologic.   Vertebrae: No acute fracture, evidence of discitis, or aggressive bone lesion.   Conus medullaris and cauda equina: Conus extends to the L1-2  level. Conus and cauda equina appear normal.   Paraspinal and other soft tissues: No acute paraspinal abnormality.   Disc levels:   Disc spaces: Disc desiccation at L2-3, L3-4, L4-5 and L5-S1 with minimal disc height loss  at L5-S1.   T12-L1: No significant disc bulge. Mild bilateral facet arthropathy. No foraminal or central canal stenosis.   L1-L2: No significant disc bulge. Mild bilateral facet arthropathy. No foraminal or central canal stenosis.   L2-L3: Broad-based disc bulge with a broad shallow right paracentral disc protrusion. Mild bilateral facet arthropathy. No foraminal or central canal stenosis.   L3-L4: Mild broad-based disc bulge. Mild bilateral facet arthropathy. No foraminal or central canal stenosis.   L4-L5: Broad-based disc bulge with a broad central disc protrusion which may impinge upon bilateral intraspinal L5 nerve roots. Bilateral subarticular recess stenosis. Mild bilateral facet arthropathy. Severe spinal stenosis. Mild-moderate bilateral foraminal stenosis.   L5-S1: Mild broad-based disc bulge. Mild bilateral facet arthropathy. Mild-moderate bilateral foraminal stenosis. No spinal stenosis.   IMPRESSION: 1. At L4-5 there is a broad-based disc bulge with a broad central disc protrusion which may impinge upon bilateral intraspinal L5 nerve roots. Bilateral subarticular recess stenosis. Mild bilateral facet arthropathy. Severe spinal stenosis. Mild-moderate bilateral foraminal stenosis. 2. At L5-S1 there is a mild broad-based disc bulge. Mild bilateral facet arthropathy. Mild-moderate bilateral foraminal stenosis. 3. At L2-3 there is a broad-based disc bulge with a broad shallow right paracentral disc protrusion. Mild bilateral facet arthropathy. 4. No acute osseous injury of the lumbar spine.     Electronically Signed   By: Elige Ko M.D.   On: 03/30/2023 09:55     Objective:  VS:  HT:    WT:   BMI:     BP:   HR: bpm  TEMP: ( )  RESP:  Physical Exam Vitals and nursing note reviewed.  Constitutional:      General: She is not in acute distress.    Appearance: Normal appearance. She is not ill-appearing.  HENT:     Head: Normocephalic and atraumatic.      Right Ear: External ear normal.     Left Ear: External ear normal.  Eyes:     Extraocular Movements: Extraocular movements intact.  Cardiovascular:     Rate and Rhythm: Normal rate.     Pulses: Normal pulses.  Pulmonary:     Effort: Pulmonary effort is normal. No respiratory distress.  Abdominal:     General: There is no distension.     Palpations: Abdomen is soft.  Musculoskeletal:        General: Tenderness present.     Cervical back: Neck supple.     Right lower leg: No edema.     Left lower leg: No edema.     Comments: Patient has good distal strength with no pain over the greater trochanters.  No clonus or focal weakness.  Skin:    Findings: No erythema, lesion or rash.  Neurological:     General: No focal deficit present.     Mental Status: She is alert and oriented to person, place, and time.     Sensory: No sensory deficit.     Motor: No weakness or abnormal muscle tone.     Coordination: Coordination normal.  Psychiatric:        Mood and Affect: Mood normal.        Behavior: Behavior normal.      Imaging: No results found.

## 2023-08-06 NOTE — Procedures (Signed)
Lumbar Epidural Steroid Injection - Interlaminar Approach with Fluoroscopic Guidance  Patient: Maria Garrison      Date of Birth: [birth year] MRN: [id number] PCP: Deeann Saint, MD      Visit Date: 07/29/2023   Universal Protocol:     Consent Given By: the patient  Position: PRONE  Additional Comments: Vital signs were monitored before and after the procedure. Patient was prepped and draped in the usual sterile fashion. The correct patient, procedure, and site was verified.   Injection Procedure Details:   Procedure diagnoses: Lumbar radiculopathy [M54.16]   Meds Administered:  Meds ordered this encounter  Medications   methylPREDNISolone acetate (DEPO-MEDROL) injection 40 mg     Laterality: Left  Location/Site:  L5-S1  Needle: 3.5 in., 20 ga. Tuohy  Needle Placement: Paramedian epidural  Findings:   -Comments: Excellent flow of contrast into the epidural space.  Procedure Details: Using a paramedian approach from the side mentioned above, the region overlying the inferior lamina was localized under fluoroscopic visualization and the soft tissues overlying this structure were infiltrated with 4 ml. of 1% Lidocaine without Epinephrine. The Tuohy needle was inserted into the epidural space using a paramedian approach.   The epidural space was localized using loss of resistance along with counter oblique bi-planar fluoroscopic views.  After negative aspirate for air, blood, and CSF, a 2 ml. volume of Isovue-250 was injected into the epidural space and the flow of contrast was observed. Radiographs were obtained for documentation purposes.    The injectate was administered into the level noted above.   Additional Comments:  No complications occurred Dressing: 2 x 2 sterile gauze and Band-Aid    Post-procedure details: Patient was observed during the procedure. Post-procedure instructions were reviewed.  Patient left the clinic in stable condition.

## 2023-08-12 ENCOUNTER — Ambulatory Visit: Payer: BC Managed Care – PPO | Admitting: Sports Medicine

## 2023-08-12 ENCOUNTER — Telehealth: Payer: Self-pay | Admitting: Physical Medicine and Rehabilitation

## 2023-08-12 DIAGNOSIS — M48062 Spinal stenosis, lumbar region with neurogenic claudication: Secondary | ICD-10-CM

## 2023-08-12 DIAGNOSIS — M25512 Pain in left shoulder: Secondary | ICD-10-CM | POA: Diagnosis not present

## 2023-08-12 DIAGNOSIS — M5416 Radiculopathy, lumbar region: Secondary | ICD-10-CM

## 2023-08-12 DIAGNOSIS — M5441 Lumbago with sciatica, right side: Secondary | ICD-10-CM | POA: Diagnosis not present

## 2023-08-12 DIAGNOSIS — G8929 Other chronic pain: Secondary | ICD-10-CM

## 2023-08-12 DIAGNOSIS — M5442 Lumbago with sciatica, left side: Secondary | ICD-10-CM

## 2023-08-12 MED ORDER — MELOXICAM 15 MG PO TABS
15.0000 mg | ORAL_TABLET | Freq: Every day | ORAL | 1 refills | Status: DC
Start: 2023-08-12 — End: 2024-01-26

## 2023-08-12 NOTE — Progress Notes (Signed)
Maria Garrison - 57 y.o. female MRN [id number]  Date of birth: 04-14-1966  Office Visit Note: Visit Date: 08/12/2023 PCP: Deeann Saint, MD Referred by: Deeann Saint, MD  Subjective: Chief Complaint  Patient presents with   Lower Back - Follow-up, Pain   HPI: Maria Garrison is a pleasant 57 y.o. female who presents today for chronic low back pain with LE radiculopathy, concomitant spinal stenosis.   On 07/29/2023 she had a left L5-S1 interlaminar epidural steroid injection.  She states this significantly helped.  Back pain on the left side and relieved her left lower extremity numbness and tingling bilaterally 70-75%.  She is still having pain in the low back that is shooting down the right leg however.  She continues on meloxicam 15 mg once daily which is somewhat helpful, she does need a refill.  She has had her initial physical therapy evaluation but has future appointments upcoming later this week.  She also mentions at the end of her visit she is having some posterior left shoulder pain near the scapula.  Reports she does have x-rays in the past but is unsure when this was.  Pertinent ROS were reviewed with the patient and found to be negative unless otherwise specified above in HPI.   Assessment & Plan: Visit Diagnoses:  1. Lumbar radiculopathy   2. Chronic midline low back pain with bilateral sciatica   3. Spinal stenosis of lumbar region with neurogenic claudication   4. Left shoulder pain, unspecified chronicity    Plan: [name] received in significant improvement with her back pain and LLE radicular symptoms after her L5-S1 left interlaminar ESI, she is having still rather significant RLE radicular symptoms with the primary low back pain.  I did discuss this case with Dr. Alvester Morin, he does believe that if a right L5-S1 interlaminar ESI was performed, this would have a high likelihood of improving her right lower extremity symptoms.  We will send a referral to his team to perform this  injection help with the low back pain and RLE symptoms.  We did review her MRI today which does show severe spinal stenosis at the L4-L5 level and L5-S1 to a lesser degree.  She is not wishing to pursue surgery, but I do think she would be a good candidate for a laminectomy if these injections and physical therapy is not helpful.  She will continue with formalized physical therapy appointment.  I will refill her meloxicam to take 15 mg once daily.  Once approved for repeat lumbar ESI, I will follow-up with her after this to evaluate that and her left shoulder.  Will likely need new left shoulder x-rays at that visit.  Follow-up: Return for f/u 1-2 weeks after injection with [place] (30-min f/u for both back and shoulder eval).   Meds & Orders:  Meds ordered this encounter  Medications   meloxicam (MOBIC) 15 MG tablet    Sig: Take 1 tablet (15 mg total) by mouth daily.    Dispense:  30 tablet    Refill:  1   No orders of the defined types were placed in this encounter.    Procedures: No procedures performed      Clinical History: MRI LUMBAR SPINE WITHOUT CONTRAST   TECHNIQUE: Multiplanar, multisequence MR imaging of the lumbar spine was performed. No intravenous contrast was administered.   COMPARISON:  None Available.   FINDINGS: Segmentation:  Standard.   Alignment:  Physiologic.   Vertebrae: No acute fracture, evidence of discitis, or  aggressive bone lesion.   Conus medullaris and cauda equina: Conus extends to the L1-2 level. Conus and cauda equina appear normal.   Paraspinal and other soft tissues: No acute paraspinal abnormality.   Disc levels:   Disc spaces: Disc desiccation at L2-3, L3-4, L4-5 and L5-S1 with minimal disc height loss at L5-S1.   T12-L1: No significant disc bulge. Mild bilateral facet arthropathy. No foraminal or central canal stenosis.   L1-L2: No significant disc bulge. Mild bilateral facet arthropathy. No foraminal or central canal  stenosis.   L2-L3: Broad-based disc bulge with a broad shallow right paracentral disc protrusion. Mild bilateral facet arthropathy. No foraminal or central canal stenosis.   L3-L4: Mild broad-based disc bulge. Mild bilateral facet arthropathy. No foraminal or central canal stenosis.   L4-L5: Broad-based disc bulge with a broad central disc protrusion which may impinge upon bilateral intraspinal L5 nerve roots. Bilateral subarticular recess stenosis. Mild bilateral facet arthropathy. Severe spinal stenosis. Mild-moderate bilateral foraminal stenosis.   L5-S1: Mild broad-based disc bulge. Mild bilateral facet arthropathy. Mild-moderate bilateral foraminal stenosis. No spinal stenosis.   IMPRESSION: 1. At L4-5 there is a broad-based disc bulge with a broad central disc protrusion which may impinge upon bilateral intraspinal L5 nerve roots. Bilateral subarticular recess stenosis. Mild bilateral facet arthropathy. Severe spinal stenosis. Mild-moderate bilateral foraminal stenosis. 2. At L5-S1 there is a mild broad-based disc bulge. Mild bilateral facet arthropathy. Mild-moderate bilateral foraminal stenosis. 3. At L2-3 there is a broad-based disc bulge with a broad shallow right paracentral disc protrusion. Mild bilateral facet arthropathy. 4. No acute osseous injury of the lumbar spine.     Electronically Signed   By: Elige Ko M.D.   On: 03/30/2023 09:55  She reports that she has never smoked. She has never used smokeless tobacco. No results for input(s): "HGBA1C", "LABURIC" in the last 8760 hours.  Objective:   Vital Signs: LMP 12/19/2019 (Approximate)   Physical Exam  Gen: Well-appearing, in no acute distress; non-toxic CV:  Well-perfused. Warm.  Resp: Breathing unlabored on room air; no wheezing. Psych: Fluid speech in conversation; appropriate affect; normal thought process Neuro: Sensation intact throughout. No gross coordination deficits.   Ortho Exam - Lumbar:  There is bilateral TTP around the lower lumbar spine.  Pain is worse with extension.  2+ Achilles and patellar DTR bilaterally.  There is 4/5 strength with great toe extension on the right compared to the left.  Otherwise she has 5/5 strength in bilateral lower extremities.  - Left shoulder/scapula: There is some tenderness to palpation within the left medial scapular border.  No AC joint or shoulder TTP.  Full active range of motion.  Imaging: No results found.  Past Medical/Family/Surgical/Social History: Medications & Allergies reviewed per EMR, new medications updated. Patient Active Problem List   Diagnosis Date Noted   Microscopic hematuria 07/19/2020   Gastroesophageal reflux disease 07/19/2020   Constipation 07/19/2020   Chronic generalized abdominal pain 07/19/2020   Spinal stenosis 12/19/2017   Pelvic pain in female 10/04/2015   Dyspareunia in female 10/04/2015   Encounter for routine gynecological examination 10/04/2015   Abdominal pain, chronic, right lower quadrant 07/27/2015   Past Medical History:  Diagnosis Date   Abdominal pain in female    For 3-4 years   COVID-19 10/11/2019   Heart murmur    Microscopic hematuria 2021   Spinal stenosis    Urinary incontinence    Family History  Problem Relation Age of Onset   Other Mother  Lung Disease??   Cirrhosis Father    Colon cancer Neg Hx    Colon polyps Neg Hx    Esophageal cancer Neg Hx    Pancreatic cancer Neg Hx    Stomach cancer Neg Hx    Past Surgical History:  Procedure Laterality Date   COLONOSCOPY     ESOPHAGOGASTRODUODENOSCOPY     HEMORROIDECTOMY     Social History   Occupational History   Not on file  Tobacco Use   Smoking status: Never   Smokeless tobacco: Never  Vaping Use   Vaping status: Never Used  Substance and Sexual Activity   Alcohol use: No   Drug use: No   Sexual activity: Yes    Partners: Male    Birth control/protection: None

## 2023-08-12 NOTE — Progress Notes (Signed)
Patient says that the injection helped the left and feels about 70% better. She says she has pain in the low back on the right side and numbness and tingling in the right foot, but not through the leg. She says that the pain in the right side as increased since the injection; she has pain in the low back and pain, numbness, and tingling in the right leg down to the foot. She says she continues to take the same medication (Meloxicam).

## 2023-08-12 NOTE — Telephone Encounter (Signed)
Pt's daughter Maria Garrison called requesting to set an appt for right side injection per Dr Shon Baton notes today. Please call pt or her daughter to set appt. Phone number is [phone].

## 2023-08-15 ENCOUNTER — Encounter: Payer: Self-pay | Admitting: Physical Therapy

## 2023-08-15 ENCOUNTER — Ambulatory Visit: Payer: BC Managed Care – PPO | Admitting: Physical Therapy

## 2023-08-15 ENCOUNTER — Telehealth: Payer: Self-pay

## 2023-08-15 DIAGNOSIS — R29898 Other symptoms and signs involving the musculoskeletal system: Secondary | ICD-10-CM

## 2023-08-15 DIAGNOSIS — M5459 Other low back pain: Secondary | ICD-10-CM | POA: Diagnosis not present

## 2023-08-15 DIAGNOSIS — M6281 Muscle weakness (generalized): Secondary | ICD-10-CM

## 2023-08-15 NOTE — Therapy (Signed)
OUTPATIENT PHYSICAL THERAPY TREATMENT   Patient Name: Maria Garrison MRN: [id number] DOB:1966-06-11, 57 y.o., female Today's Date: 08/15/2023  END OF SESSION:  PT End of Session - 08/15/23 0805     Visit Number 2    Number of Visits 6    Date for PT Re-Evaluation 09/08/23    Authorization Type [employer] $70 copay; 30 visit limit    PT Start Time 0802    PT Stop Time 0840    PT Time Calculation (min) 38 min    Activity Tolerance Patient tolerated treatment well    Behavior During Therapy [facility] for tasks assessed/performed              Past Medical History:  Diagnosis Date   Abdominal pain in female    For 3-4 years   COVID-19 10/11/2019   Heart murmur    Microscopic hematuria 2021   Spinal stenosis    Urinary incontinence    Past Surgical History:  Procedure Laterality Date   COLONOSCOPY     ESOPHAGOGASTRODUODENOSCOPY     HEMORROIDECTOMY     Patient Active Problem List   Diagnosis Date Noted   Microscopic hematuria 07/19/2020   Gastroesophageal reflux disease 07/19/2020   Constipation 07/19/2020   Chronic generalized abdominal pain 07/19/2020   Spinal stenosis 12/19/2017   Pelvic pain in female 10/04/2015   Dyspareunia in female 10/04/2015   Encounter for routine gynecological examination 10/04/2015   Abdominal pain, chronic, right lower quadrant 07/27/2015    PCP: Deeann Saint, MD  REFERRING PROVIDER: Madelyn Brunner, DO  REFERRING DIAG: [phone] (ICD-10-CM) - Spinal stenosis of lumbar region with neurogenic claudication  Rationale for Evaluation and Treatment: Rehabilitation  THERAPY DIAG:  Other low back pain  Muscle weakness (generalized)  Other symptoms and signs involving the musculoskeletal system  ONSET DATE: 2 years ago with exacerbation x 3 months   SUBJECTIVE:                                                                                                                                                                                            SUBJECTIVE STATEMENT: Injection helped her Lt side; planning to have Rt side injection but not scheduled yet.  Pain still elevated.  Pain only going down the RLE now.  Does some exercise in the morning; and more in the evening.  PERTINENT HISTORY:  Spinal stenosis  PAIN:  Are you having pain? Yes: NPRS scale: 7 currently, up to 8, at best 3-4/10 Pain location: low back; Rt now worse than Lt Pain description: strong ache, intermittent burning Aggravating factors: standing, forward bending, and arching Relieving factors: standing  up straight  PRECAUTIONS:  None  RED FLAGS: None   WEIGHT BEARING RESTRICTIONS:  No  FALLS:  Has patient fallen in last 6 months? 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  She also has moderate to severe spinal stenosis.  She does seem symptomatic from this as she has pain with extension as well as prolonged standing or walking  PATIENT SURVEYS:  07/28/23 FOTO 60 (predicted 70)  COGNITIVE STATUS: Within functional limits for tasks assessed   SENSATION: WFL  POSTURE:  rounded shoulders and forward head   GAIT: 07/28/23 Comments: independent with ambulation   PALPATION: 07/28/23 tender to palpation L3-S1 and bil SIJ joints; tightness in piriformis bil   LUMBAR ROM:   Active  A/PROM  eval  Flexion Limited 10% with pain  Extension WNL with pain  Right quadrant WNL with Lt side pain  Left quadrant WNL no pain   (Blank rows = not tested)     LOWER EXTREMITY MMT:  giveway weakness noted with hip  flexion  MMT Right eval Left eval  Hip flexion 3+/5 3+/5  Hip extension 3/5 3/5  Hip abduction    Hip adduction    Hip internal rotation    Hip external rotation    Knee flexion 5/5 5/5  Knee extension 5/5 5/5  Ankle dorsiflexion 5/5 5/5  Ankle plantarflexion    Ankle inversion    Ankle eversion     (Blank rows = not tested)    SPECIAL TESTS:  07/28/23 Lumbar Slump test: increased pain on Rt with decreased neural tension; positive on Lt  TREATMENT:                                                                                                                              DATE:  08/15/23 TherEx NuStep L6 x 8 min Supine piriformis stretch 3x30 sec bil Single knee to chest 3x30 sec bil Posterior pelvic tilt 10 x 5 sec hold Bridges x 10 reps; 5 sec hold Hooklying single limb clamshell L4 band x 10 reps bil Opposite alternating isometric hip flexion x 10 reps bil; 5 sec hold Seated forward lumbar stretch with rolling stool 5x10 sec hold   07/28/23 See HEP - demonstrated with trial reps performed PRN for comprehension      PATIENT EDUCATION:  Education details: HEP Person educated: Patient Education method: Programmer, multimedia, Facilities manager, and Handouts Education comprehension: verbalized understanding, returned demonstration, and needs further education  HOME EXERCISE PROGRAM: Access Code: [email or web address] URL: [email or web address] Date: 08/15/2023 Prepared by: Judeth Cornfield  [name]  Exercises - Hooklying Single Knee to Chest  - 2 x daily - 7 x weekly - 1 sets - 3 reps - 30 sec hold - Supine Piriformis Stretch with Foot on Ground  - 2 x daily - 7 x weekly - 1 sets - 3 reps - 30 sec hold - Supine Posterior Pelvic Tilt  - 2 x daily - 7 x weekly - 1 sets - 10 reps - 5 sec hold - Supine Bridge  - 2 x daily - 7 x weekly - 1 sets - 10 reps - 5 sec hold - Hooklying Isometric Hip Flexion with Opposite Arm  - 1 x daily - 7 x weekly - 1 sets - 10 reps - 5 sec hold -  Hooklying Single Leg Bent Knee Fallouts with Resistance  - 1 x daily - 7 x weekly - 1 sets - 10 reps   ASSESSMENT:  CLINICAL IMPRESSION: Pt reporting improvement following injection, as well as compliance with HEP at this time.  Did report decreased pain following session today, but pain still elevated.  Added to HEP today. Continue skilled PT.  OBJECTIVE IMPAIRMENTS: decreased ROM, decreased strength, hypomobility, increased fascial restrictions, increased muscle spasms, postural dysfunction, and pain.   ACTIVITY LIMITATIONS: carrying, lifting, bending, standing, and locomotion level  PARTICIPATION LIMITATIONS: meal prep, cleaning, laundry, driving, shopping, community activity, occupation, and yard work  PERSONAL FACTORS: 1 comorbidity: spinal stenosis  are also affecting patient's functional outcome.   REHAB POTENTIAL: Good  CLINICAL DECISION MAKING: Evolving/moderate complexity  EVALUATION COMPLEXITY: Moderate   GOALS: Goals reviewed with patient? Yes  SHORT TERM GOALS: Target date: 08/18/2023   Independent with initial HEP Goal status: INITIAL    LONG TERM GOALS: Target date: 09/08/2023  Independent with final HEP Goal status: INITIAL  2.  FOTO score improved to 70 Goal status: INITIAL  3.  Bil hip strength improved to 4+/5 for improved function and mobility Goal status: INIITAL  4.  Report pain < 4/10 with standing and work responsibilities for improved function Goal status: INITIAL     PLAN:  PT FREQUENCY: 1x/week (recommend 1x/wk, will see every other week at pt's request)  PT DURATION: 6 weeks  PLANNED INTERVENTIONS: 97164- PT Re-evaluation, 97110-Therapeutic exercises, 97530- Therapeutic activity, 97112- Neuromuscular re-education, 97535- Self Care, 16109- Manual therapy, [id number]- Aquatic Therapy, 97014- Electrical stimulation (unattended), [id number]- Traction (mechanical), Patient/Family education, Balance training, Taping, Dry Needling, Joint  mobilization, Spinal manipulation, Spinal mobilization, Cryotherapy, and Moist heat.  PLAN FOR NEXT SESSION: review updated HEP, manual/modalities/DN PRN for pain; hip/core strengthening   NEXT MD VISIT: Not yet scheduled   Clarita Crane, PT, DPT 08/15/23 8:46 AM

## 2023-08-15 NOTE — Telephone Encounter (Signed)
Error

## 2023-08-25 ENCOUNTER — Ambulatory Visit: Payer: BC Managed Care – PPO | Admitting: Physical Medicine and Rehabilitation

## 2023-08-25 ENCOUNTER — Ambulatory Visit: Payer: BC Managed Care – PPO | Admitting: Sports Medicine

## 2023-08-26 ENCOUNTER — Ambulatory Visit: Payer: BC Managed Care – PPO | Admitting: Physical Medicine and Rehabilitation

## 2023-08-26 ENCOUNTER — Other Ambulatory Visit: Payer: Self-pay | Admitting: Physical Medicine and Rehabilitation

## 2023-08-26 ENCOUNTER — Other Ambulatory Visit: Payer: Self-pay

## 2023-08-26 VITALS — BP 128/81 | HR 71

## 2023-08-26 DIAGNOSIS — M5416 Radiculopathy, lumbar region: Secondary | ICD-10-CM

## 2023-08-26 MED ORDER — METHYLPREDNISOLONE ACETATE 40 MG/ML IJ SUSP
40.0000 mg | Freq: Once | INTRAMUSCULAR | Status: AC
Start: 2023-08-26 — End: 2023-08-26
  Administered 2023-08-26: 40 mg

## 2023-08-26 NOTE — Progress Notes (Signed)
Functional Pain Scale - descriptive words and definitions  Distressing (6)    Pain is present/unable to complete most ADLs limited by pain/sleep is difficult and active distraction is only marginal. Moderate range order  Average Pain 6   +Driver, -BT, -Dye Allergies.  Here for [facility] today. LBP with pain on right side.

## 2023-08-29 ENCOUNTER — Encounter: Payer: BC Managed Care – PPO | Admitting: Physical Therapy

## 2023-09-02 NOTE — Procedures (Signed)
Lumbar Epidural Steroid Injection - Interlaminar Approach with Fluoroscopic Guidance  Patient: Maria Garrison      Date of Birth: 1965/10/09 MRN: [id number] PCP: Deeann Saint, MD      Visit Date: 08/26/2023   Universal Protocol:     Consent Given By: the patient  Position: PRONE  Additional Comments: Vital signs were monitored before and after the procedure. Patient was prepped and draped in the usual sterile fashion. The correct patient, procedure, and site was verified.   Injection Procedure Details:   Procedure diagnoses: Lumbar radiculopathy [M54.16]   Meds Administered:  Meds ordered this encounter  Medications   methylPREDNISolone acetate (DEPO-MEDROL) injection 40 mg     Laterality: Right  Location/Site:  L5-S1  Needle: 3.5 in., 20 ga. Tuohy  Needle Placement: Paramedian epidural  Findings:   -Comments: Excellent flow of contrast into the epidural space.  Procedure Details: Using a paramedian approach from the side mentioned above, the region overlying the inferior lamina was localized under fluoroscopic visualization and the soft tissues overlying this structure were infiltrated with 4 ml. of 1% Lidocaine without Epinephrine. The Tuohy needle was inserted into the epidural space using a paramedian approach.   The epidural space was localized using loss of resistance along with counter oblique bi-planar fluoroscopic views.  After negative aspirate for air, blood, and CSF, a 2 ml. volume of Isovue-250 was injected into the epidural space and the flow of contrast was observed. Radiographs were obtained for documentation purposes.    The injectate was administered into the level noted above.   Additional Comments:  No complications occurred Dressing: 2 x 2 sterile gauze and Band-Aid    Post-procedure details: Patient was observed during the procedure. Post-procedure instructions were reviewed.  Patient left the clinic in stable condition.

## 2023-09-02 NOTE — Progress Notes (Signed)
Maria Garrison - 57 y.o. female MRN [id number]  Date of birth: 1966/10/03  Office Visit Note: Visit Date: 08/26/2023 PCP: Deeann Saint, MD Referred by: Deeann Saint, MD  Subjective: Chief Complaint  Patient presents with   Lower Back - Pain   HPI:  Maria Garrison is a 57 y.o. female who comes in today at the request of Dr. Madelyn Brunner for planned Right L5-S1 Lumbar Interlaminar epidural steroid injection with fluoroscopic guidance.  The patient has failed conservative care including home exercise, medications, time and activity modification.  This injection will be diagnostic and hopefully therapeutic.  Please see requesting physician notes for further details and justification.   ROS Otherwise per HPI.  Assessment & Plan: Visit Diagnoses:    ICD-10-CM   1. Lumbar radiculopathy  M54.16 XR C-ARM NO REPORT    Epidural Steroid injection    methylPREDNISolone acetate (DEPO-MEDROL) injection 40 mg      Plan: No additional findings.   Meds & Orders:  Meds ordered this encounter  Medications   methylPREDNISolone acetate (DEPO-MEDROL) injection 40 mg    Orders Placed This Encounter  Procedures   XR C-ARM NO REPORT   Epidural Steroid injection    Follow-up: Return for visit to requesting provider as needed.   Procedures: No procedures performed  Lumbar Epidural Steroid Injection - Interlaminar Approach with Fluoroscopic Guidance  Patient: Maria Garrison      Date of Birth: Aug 09, 1966 MRN: [id number] PCP: Deeann Saint, MD      Visit Date: 08/26/2023   Universal Protocol:     Consent Given By: the patient  Position: PRONE  Additional Comments: Vital signs were monitored before and after the procedure. Patient was prepped and draped in the usual sterile fashion. The correct patient, procedure, and site was verified.   Injection Procedure Details:   Procedure diagnoses: Lumbar radiculopathy [M54.16]   Meds Administered:  Meds ordered this encounter  Medications    methylPREDNISolone acetate (DEPO-MEDROL) injection 40 mg     Laterality: Right  Location/Site:  L5-S1  Needle: 3.5 in., 20 ga. Tuohy  Needle Placement: Paramedian epidural  Findings:   -Comments: Excellent flow of contrast into the epidural space.  Procedure Details: Using a paramedian approach from the side mentioned above, the region overlying the inferior lamina was localized under fluoroscopic visualization and the soft tissues overlying this structure were infiltrated with 4 ml. of 1% Lidocaine without Epinephrine. The Tuohy needle was inserted into the epidural space using a paramedian approach.   The epidural space was localized using loss of resistance along with counter oblique bi-planar fluoroscopic views.  After negative aspirate for air, blood, and CSF, a 2 ml. volume of Isovue-250 was injected into the epidural space and the flow of contrast was observed. Radiographs were obtained for documentation purposes.    The injectate was administered into the level noted above.   Additional Comments:  No complications occurred Dressing: 2 x 2 sterile gauze and Band-Aid    Post-procedure details: Patient was observed during the procedure. Post-procedure instructions were reviewed.  Patient left the clinic in stable condition.   Clinical History: MRI LUMBAR SPINE WITHOUT CONTRAST   TECHNIQUE: Multiplanar, multisequence MR imaging of the lumbar spine was performed. No intravenous contrast was administered.   COMPARISON:  None Available.   FINDINGS: Segmentation:  Standard.   Alignment:  Physiologic.   Vertebrae: No acute fracture, evidence of discitis, or aggressive bone lesion.   Conus medullaris and cauda equina: Conus extends to the  L1-2 level. Conus and cauda equina appear normal.   Paraspinal and other soft tissues: No acute paraspinal abnormality.   Disc levels:   Disc spaces: Disc desiccation at L2-3, L3-4, L4-5 and L5-S1 with minimal disc height loss  at L5-S1.   T12-L1: No significant disc bulge. Mild bilateral facet arthropathy. No foraminal or central canal stenosis.   L1-L2: No significant disc bulge. Mild bilateral facet arthropathy. No foraminal or central canal stenosis.   L2-L3: Broad-based disc bulge with a broad shallow right paracentral disc protrusion. Mild bilateral facet arthropathy. No foraminal or central canal stenosis.   L3-L4: Mild broad-based disc bulge. Mild bilateral facet arthropathy. No foraminal or central canal stenosis.   L4-L5: Broad-based disc bulge with a broad central disc protrusion which may impinge upon bilateral intraspinal L5 nerve roots. Bilateral subarticular recess stenosis. Mild bilateral facet arthropathy. Severe spinal stenosis. Mild-moderate bilateral foraminal stenosis.   L5-S1: Mild broad-based disc bulge. Mild bilateral facet arthropathy. Mild-moderate bilateral foraminal stenosis. No spinal stenosis.   IMPRESSION: 1. At L4-5 there is a broad-based disc bulge with a broad central disc protrusion which may impinge upon bilateral intraspinal L5 nerve roots. Bilateral subarticular recess stenosis. Mild bilateral facet arthropathy. Severe spinal stenosis. Mild-moderate bilateral foraminal stenosis. 2. At L5-S1 there is a mild broad-based disc bulge. Mild bilateral facet arthropathy. Mild-moderate bilateral foraminal stenosis. 3. At L2-3 there is a broad-based disc bulge with a broad shallow right paracentral disc protrusion. Mild bilateral facet arthropathy. 4. No acute osseous injury of the lumbar spine.     Electronically Signed   By: Elige Ko M.D.   On: 03/30/2023 09:55     Objective:  VS:  HT:    WT:   BMI:     BP:128/81  HR:71bpm  TEMP: ( )  RESP:  Physical Exam Vitals and nursing note reviewed.  Constitutional:      General: She is not in acute distress.    Appearance: Normal appearance. She is not ill-appearing.  HENT:     Head: Normocephalic and  atraumatic.     Right Ear: External ear normal.     Left Ear: External ear normal.  Eyes:     Extraocular Movements: Extraocular movements intact.  Cardiovascular:     Rate and Rhythm: Normal rate.     Pulses: Normal pulses.  Pulmonary:     Effort: Pulmonary effort is normal. No respiratory distress.  Abdominal:     General: There is no distension.     Palpations: Abdomen is soft.  Musculoskeletal:        General: Tenderness present.     Cervical back: Neck supple.     Right lower leg: No edema.     Left lower leg: No edema.     Comments: Patient has good distal strength with no pain over the greater trochanters.  No clonus or focal weakness.  Skin:    Findings: No erythema, lesion or rash.  Neurological:     General: No focal deficit present.     Mental Status: She is alert and oriented to person, place, and time.     Sensory: No sensory deficit.     Motor: No weakness or abnormal muscle tone.     Coordination: Coordination normal.  Psychiatric:        Mood and Affect: Mood normal.        Behavior: Behavior normal.      Imaging: No results found.

## 2024-01-15 ENCOUNTER — Ambulatory Visit: Payer: Self-pay

## 2024-01-15 ENCOUNTER — Encounter: Payer: Self-pay | Admitting: *Deleted

## 2024-01-15 NOTE — Telephone Encounter (Signed)
Pt has an appt 4/11.

## 2024-01-15 NOTE — Telephone Encounter (Signed)
 Copied from CRM [id number]. Topic: Clinical - Red Word Triage >> Jan 15, 2024 12:17 PM Maria Garrison wrote: [facility] that prompted transfer to Nurse Triage: head and neck pain 7/10   Chief Complaint: Head and Neck Pain Symptoms: Headache, Shoulder Pain, Neck Pain Frequency: A couple of days  Disposition: [] ED /[] Urgent Care (no appt availability in office) / [] Appointment(In office/virtual)/ []  [facility]/ [] Home Care/ [] Refused Recommended Disposition /[] [facility]/ []  Follow-up with PCP  Additional Notes: HC is being triaged for headaches, shoulder pain, and neck pain. Call disconnected during triage, unable to reach patient again at this time.

## 2024-01-15 NOTE — Telephone Encounter (Signed)
 This encounter was created in error - please disregard.

## 2024-01-15 NOTE — Telephone Encounter (Signed)
 Patient daughter, on [place]; calling not with patient now . Patient at work Stage manager Complaint: headaches , neck pain worsening Symptoms: headaches worsening x 2 days, back of neck, across shoulders. Can lift arms and can work . Dizziness at times but did not report today . Shoulders feeling tight Frequency: 2 days  Pertinent Negatives: Patient denies chest pain no difficulty breathing no fever reported no N/V, no blurred vision or other neurological sx reported  Disposition: [] ED /[] Urgent Care (no appt availability in office) / [x] Appointment(In office/virtual)/ []  [facility]/ [] Home Care/ [] Refused Recommended Disposition /[] [facility]/ []  Follow-up with PCP Additional Notes:   Appt scheduled for tomorrow with PCP.      Reason for Disposition  [1] MODERATE headache (e.g., interferes with normal activities) AND [2] present > 24 hours AND [3] unexplained  (Exceptions: analgesics not tried, typical migraine, or headache part of viral illness)  Answer Assessment - Initial Assessment Questions 1. LOCATION: "Where does it hurt?"      Back of head, neck across shoulders per patient daughter on DPR not with patient now  2. ONSET: "When did the headache start?" (Minutes, hours or days)      2 days ago  3. PATTERN: "Does the pain come and go, or has it been constant since it started?"     Comes and goes  4. SEVERITY: "How bad is the pain?" and "What does it keep you from doing?"  (e.g., Scale 1-10; mild, moderate, or severe)   - MILD (1-3): doesn't interfere with normal activities    - MODERATE (4-7): interferes with normal activities or awakens from sleep    - SEVERE (8-10): excruciating pain, unable to do any normal activities        Does not know pain level now but has been severe at times over past 2 days  5. RECURRENT SYMPTOM: "Have you ever had headaches before?" If Yes, ask: "When was the last time?" and "What happened that time?"      Yes  6. CAUSE: "What do you  think is causing the headache?"     na 7. MIGRAINE: "Have you been diagnosed with migraine headaches?" If Yes, ask: "Is this headache similar?"      na 8. HEAD INJURY: "Has there been any recent injury to the head?"      na 9. OTHER SYMPTOMS: "Do you have any other symptoms?" (fever, stiff neck, eye pain, sore throat, cold symptoms)     Back of head, neck, shoulders 10. PREGNANCY: "Is there any chance you are pregnant?" "When was your last menstrual period?"       na  Protocols used: [facility]

## 2024-01-16 ENCOUNTER — Ambulatory Visit: Admitting: Family Medicine

## 2024-01-16 ENCOUNTER — Encounter: Payer: Self-pay | Admitting: Family Medicine

## 2024-01-16 VITALS — BP 120/70 | HR 72 | Temp 98.2°F | Ht 60.0 in | Wt 133.2 lb

## 2024-01-16 DIAGNOSIS — J302 Other seasonal allergic rhinitis: Secondary | ICD-10-CM | POA: Diagnosis not present

## 2024-01-16 DIAGNOSIS — R448 Other symptoms and signs involving general sensations and perceptions: Secondary | ICD-10-CM

## 2024-01-16 DIAGNOSIS — M791 Myalgia, unspecified site: Secondary | ICD-10-CM

## 2024-01-16 DIAGNOSIS — G44209 Tension-type headache, unspecified, not intractable: Secondary | ICD-10-CM | POA: Diagnosis not present

## 2024-01-16 MED ORDER — CYCLOBENZAPRINE HCL 5 MG PO TABS
5.0000 mg | ORAL_TABLET | Freq: Three times a day (TID) | ORAL | 1 refills | Status: DC | PRN
Start: 2024-01-16 — End: 2024-05-17

## 2024-01-16 NOTE — Patient Instructions (Addendum)
 A prescription for Flexeril, a muscle relaxer was sent to your pharmacy.  This can help with your headache and the tension in your shoulders and back.  You are also given samples of Zyrtec and Allegra.  Although the box of Allegra says Allegra hives it is the same active ingredient (fexofenadine 180 mg) for the regular allergy medication.  If you continue to have symptoms despite the medications let us know.

## 2024-01-16 NOTE — Progress Notes (Signed)
 Established Patient Office Visit   Subjective  Patient ID: Maria Garrison, female    DOB: 16-May-1966  Age: 58 y.o. MRN: [id number]  Chief Complaint  Patient presents with   Headache  [place] interpreter present and patient's daughter.  Pt is a 58 yo female seen for ongoing concerns.  Pt with neck pain, HA, nasal pressure.  Pt with headaches times a while.  Worse in the last 2 days.  Pain  in the nasal area moving upward towards frontal area of head into top of head.  Having intermittent dizziness 8/10 neck pain.  Patient works at a [employer] where she stands on concrete floors.  Using antifatigue mat.  Has a few short breaks throughout the day and lunch.  Patient previously sent to neurosurgery for chronic low back pain due to spinal stenosis, however would like to avoid surgery.    Patient Active Problem List   Diagnosis Date Noted   Microscopic hematuria 07/19/2020   Gastroesophageal reflux disease 07/19/2020   Constipation 07/19/2020   Chronic generalized abdominal pain 07/19/2020   Spinal stenosis 12/19/2017   Pelvic pain in female 10/04/2015   Dyspareunia in female 10/04/2015   Encounter for routine gynecological examination 10/04/2015   Abdominal pain, chronic, right lower quadrant 07/27/2015   Past Medical History:  Diagnosis Date   Abdominal pain in female    For 3-4 years   COVID-19 10/11/2019   Heart murmur    Microscopic hematuria 2021   Spinal stenosis    Urinary incontinence    Past Surgical History:  Procedure Laterality Date   COLONOSCOPY     ESOPHAGOGASTRODUODENOSCOPY     HEMORROIDECTOMY     Social History   Tobacco Use   Smoking status: Never   Smokeless tobacco: Never  Vaping Use   Vaping status: Never Used  Substance Use Topics   Alcohol use: No   Drug use: No   Family History  Problem Relation Age of Onset   Other Mother        Lung Disease??   Cirrhosis Father    Colon cancer Neg Hx    Colon polyps Neg Hx    Esophageal  cancer Neg Hx    Pancreatic cancer Neg Hx    Stomach cancer Neg Hx    No Known Allergies    ROS Negative unless stated above    Objective:     BP 120/70 (BP Location: Right Arm, Patient Position: Sitting, Cuff Size: Normal)   Pulse 72   Temp 98.2 F (36.8 C) (Oral)   Ht 5' (1.524 m)   Wt 133 lb 3.2 oz (60.4 kg)   LMP 12/19/2019 (Approximate)   SpO2 98%   BMI 26.01 kg/m  BP Readings from Last 3 Encounters:  01/16/24 120/70  08/26/23 128/81  06/19/23 110/80   Wt Readings from Last 3 Encounters:  01/16/24 133 lb 3.2 oz (60.4 kg)  06/19/23 128 lb 9.6 oz (58.3 kg)  01/31/23 130 lb (59 kg)    Physical Exam Constitutional:      General: She is not in acute distress.    Appearance: Normal appearance.  HENT:     Head: Normocephalic and atraumatic.     Nose: Nose normal.     Mouth/Throat:     Mouth: Mucous membranes are moist.  Cardiovascular:     Rate and Rhythm: Normal rate and regular rhythm.     Heart sounds: Normal heart sounds. No murmur heard.    No gallop.  Pulmonary:  Effort: Pulmonary effort is normal. No respiratory distress.     Breath sounds: Normal breath sounds. No wheezing, rhonchi or rales.  Musculoskeletal:     Cervical back: Tenderness present.     Thoracic back: Bony tenderness present.     Lumbar back: Tenderness and bony tenderness present.       Back:     Comments: TTP of midline mid-lower thoracic spine, lumbar spine, lumbar paraspinal muscles b/l, and L lateral trapezius at shoulder.  Increased tension and a knot in L lateral trap at mid shoulder.  TTP of trapezius at superior nuchal line b/l.  Skin:    General: Skin is warm and dry.  Neurological:     Mental Status: She is alert and oriented to person, place, and time.      No results found for any visits on 01/16/24.    Assessment & Plan:  Seasonal allergies  Facial pressure  Acute non intractable tension-type headache -     Cyclobenzaprine  HCl; Take 1 tablet (5 mg total)  by mouth 3 (three) times daily as needed for muscle spasms.  Dispense: 30 tablet; Refill: 1  Muscle tension pain -     Cyclobenzaprine  HCl; Take 1 tablet (5 mg total) by mouth 3 (three) times daily as needed for muscle spasms.  Dispense: 30 tablet; Refill: 1  Facial pressure, dizziness likely 2/2 allergies.  Patient given samples of Zyrtec and Allegra in clinic.  Advised to try 1 medication at a time to see if symptoms improve.  If medication is helpful can purchase generic OTC.  For increased/worsening symptoms notify clinic as may develop sinusitis.  Pain in neck likely 2/2 increased muscle tension.  Given Rx for muscle relaxer however advised may cause drowsiness.  Can try taking at night to help with headache and tension in shoulders and back.  Discussed other supportive care including heat, massage, stretching, taking breaks when able, topical analgesics like IcyHot, Biofreeze, Aspercreme.  Return if symptoms worsen or fail to improve.   Viola Greulich, MD

## 2024-04-26 ENCOUNTER — Ambulatory Visit: Payer: Self-pay

## 2024-04-26 NOTE — Telephone Encounter (Signed)
 FYI Only or Action Required?: FYI only for provider.  Patient was last seen in primary care on 01/16/2024 by Mercer Clotilda SAUNDERS, MD.  Called Nurse Triage reporting Shoulder Pain.  Symptoms began several years ago.  Interventions attempted: Other: massage.  Symptoms are: gradually worsening.  Triage Disposition: See Physician Within 24 Hours  Patient/caregiver understands and will follow disposition?: Yes         Copied from CRM [id number]. Topic: Clinical - Red Word Triage >> Apr 26, 2024  1:33 PM Emylou G wrote: [facility] that prompted transfer to Nurse Triage: pain in both shoulders all the way to hands and legs.SABRA achy legs and swollen hand left Reason for Disposition  Numbness (i.e., loss of sensation) in hand or fingers  Answer Assessment - Initial Assessment Questions 1. ONSET: When did the pain start?     Years but gotten worse 2. LOCATION: Where is the pain located?     Both shoulders 3. PAIN: How bad is the pain? (Scale 1-10; or mild, moderate, severe)     6/10 4. WORK OR EXERCISE: Has there been any recent work or exercise that involved this part of the body?     no 5. CAUSE: What do you think is causing the shoulder pain?     unknown 6. OTHER SYMPTOMS: Do you have any other symptoms? (e.g., neck pain, swelling, rash, fever, numbness, weakness)     Left hand swollen, achy legs  Protocols used: Shoulder Pain-A-AH

## 2024-04-27 ENCOUNTER — Ambulatory Visit: Admitting: Internal Medicine

## 2024-04-27 ENCOUNTER — Encounter: Payer: Self-pay | Admitting: Internal Medicine

## 2024-04-27 VITALS — BP 110/80 | HR 70 | Temp 98.1°F | Wt 129.6 lb

## 2024-04-27 DIAGNOSIS — M791 Myalgia, unspecified site: Secondary | ICD-10-CM | POA: Diagnosis not present

## 2024-04-27 MED ORDER — MELOXICAM 7.5 MG PO TABS
7.5000 mg | ORAL_TABLET | Freq: Every day | ORAL | 0 refills | Status: AC
Start: 2024-04-27 — End: ?

## 2024-04-27 NOTE — Progress Notes (Signed)
     Established Patient Office Visit     CC/Reason for Visit: Body aches  HPI: Maria Garrison is a 58 y.o. female who is coming in today for the above mentioned reasons.  Has been experiencing neck, shoulder, arm, upper back and leg aches now for the better part of 3 years.  She works at a [employer] where she is standing for 12 hours at a time.  Requesting a work note to lift no more than 10 pounds.   Past Medical/Surgical History: Past Medical History:  Diagnosis Date   Abdominal pain in female    For 3-4 years   COVID-19 10/11/2019   Heart murmur    Microscopic hematuria 2021   Spinal stenosis    Urinary incontinence     Past Surgical History:  Procedure Laterality Date   COLONOSCOPY     ESOPHAGOGASTRODUODENOSCOPY     HEMORROIDECTOMY      Social History:  reports that she has never smoked. She has never used smokeless tobacco. She reports that she does not drink alcohol and does not use drugs.  Allergies: No Known Allergies  Family History:  Family History  Problem Relation Age of Onset   Other Mother        Lung Disease??   Cirrhosis Father    Colon cancer Neg Hx    Colon polyps Neg Hx    Esophageal cancer Neg Hx    Pancreatic cancer Neg Hx    Stomach cancer Neg Hx      Current Outpatient Medications:    meloxicam  (MOBIC ) 7.5 MG tablet, Take 1 tablet (7.5 mg total) by mouth daily., Disp: 30 tablet, Rfl: 0   cyclobenzaprine  (FLEXERIL ) 5 MG tablet, Take 1 tablet (5 mg total) by mouth 3 (three) times daily as needed for muscle spasms., Disp: 30 tablet, Rfl: 1   pantoprazole  (PROTONIX ) 40 MG tablet, Take 1 tablet (40 mg total) by mouth daily., Disp: 180 tablet, Rfl: 0  Review of Systems:  Negative unless indicated in HPI.   Physical Exam: Vitals:   04/27/24 1522  BP: 110/80  Pulse: 70  Temp: 98.1 F (36.7 C)  TempSrc: Oral  SpO2: 99%  Weight: 129 lb 9.6 oz (58.8 kg)    Body mass index is 25.31 kg/m.    Impression and  Plan:  Myalgia -     Meloxicam ; Take 1 tablet (7.5 mg total) by mouth daily.  Dispense: 30 tablet; Refill: 0  -Will provide meloxicam  to take for 10 days plus some extra just in case.  Work note provided.  Advised to follow-up with PCP if further issues.   Time spent:22 minutes reviewing chart, interviewing and examining patient and formulating plan of care.     Tully Theophilus Andrews, MD [facility]

## 2024-05-17 ENCOUNTER — Encounter: Payer: Self-pay | Admitting: Family Medicine

## 2024-05-17 ENCOUNTER — Ambulatory Visit: Admitting: Family Medicine

## 2024-05-17 VITALS — BP 124/72 | HR 75 | Temp 98.6°F | Ht 60.0 in | Wt 126.4 lb

## 2024-05-17 DIAGNOSIS — G44229 Chronic tension-type headache, not intractable: Secondary | ICD-10-CM

## 2024-05-17 DIAGNOSIS — M79602 Pain in left arm: Secondary | ICD-10-CM

## 2024-05-17 DIAGNOSIS — M25511 Pain in right shoulder: Secondary | ICD-10-CM | POA: Diagnosis not present

## 2024-05-17 DIAGNOSIS — M545 Low back pain, unspecified: Secondary | ICD-10-CM

## 2024-05-17 DIAGNOSIS — M2141 Flat foot [pes planus] (acquired), right foot: Secondary | ICD-10-CM

## 2024-05-17 DIAGNOSIS — G8929 Other chronic pain: Secondary | ICD-10-CM

## 2024-05-17 DIAGNOSIS — R202 Paresthesia of skin: Secondary | ICD-10-CM

## 2024-05-17 DIAGNOSIS — M79601 Pain in right arm: Secondary | ICD-10-CM

## 2024-05-17 DIAGNOSIS — M25512 Pain in left shoulder: Secondary | ICD-10-CM

## 2024-05-17 DIAGNOSIS — M79671 Pain in right foot: Secondary | ICD-10-CM

## 2024-05-17 DIAGNOSIS — M791 Myalgia, unspecified site: Secondary | ICD-10-CM

## 2024-05-17 DIAGNOSIS — M79672 Pain in left foot: Secondary | ICD-10-CM

## 2024-05-17 DIAGNOSIS — M2142 Flat foot [pes planus] (acquired), left foot: Secondary | ICD-10-CM

## 2024-05-17 MED ORDER — CYCLOBENZAPRINE HCL 5 MG PO TABS
5.0000 mg | ORAL_TABLET | Freq: Three times a day (TID) | ORAL | 1 refills | Status: AC | PRN
Start: 2024-05-17 — End: ?
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
## 2024-05-26 ENCOUNTER — Other Ambulatory Visit: Payer: Self-pay | Admitting: Internal Medicine

## 2024-05-26 DIAGNOSIS — M791 Myalgia, unspecified site: Secondary | ICD-10-CM

## 2024-06-09 ENCOUNTER — Ambulatory Visit: Payer: Self-pay | Admitting: *Deleted

## 2024-06-09 NOTE — Telephone Encounter (Signed)
 FYI Only or Action Required?: FYI only for provider.  Patient was last seen in primary care on 05/17/2024 by Mercer Clotilda SAUNDERS, MD.  Called Nurse Triage reporting Pelvic Pain.  Symptoms began about a month ago.  Interventions attempted: Rest, hydration, or home remedies.  Symptoms are: gradually worsening.  Triage Disposition: See PCP Within 2 Weeks  Patient/caregiver understands and will follow disposition?: Yes   Reason for Disposition  [1] Pelvic pain is a chronic symptom (recurrent or ongoing AND [2] present > 4 weeks)  Answer Assessment - Initial Assessment Questions 1. LOCATION: Where does it hurt?      Low pelvic pain- midline 2. RADIATION: Does the pain shoot anywhere else? (e.g., lower back, groin, thighs)     Travels around the body 3. ONSET: When did the pain begin? (e.g., minutes, hours or days ago)      Recently gotten worse- 1 month  5. PATTERN Does the pain come and go, or is it constant?     Comes and goes 6. SEVERITY: How bad is the pain?  (e.g., Scale 1-10; mild, moderate, or severe)     Can be severe 7. RECURRENT SYMPTOM: Have you ever had this type of pelvic pain before? If Yes, ask: When was the last time? and What happened that time?      Pain has been present for a long time- hx injections 8. CAUSE: What do you think is causing the pelvic pain?     unsure 9. RELIEVING/AGGRAVATING FACTORS: What makes it better or worse? (e.g., activity/rest, sexual intercourse, voiding, passing stool)     Rest helps 10. OTHER SYMPTOMS: Has there been any other symptoms? (e.g., fever, constipation, diarrhea, urine problems, vaginal bleeding, vaginal discharge, or vomiting?       constipation  Protocols used: Pelvic Pain - [facility]   Copied from CRM #[id number]. Topic: Clinical - Red Word Triage >> Jun 09, 2024 11:24 AM Mia F wrote: Red Word that prompted transfer to Nurse Triage: Pt daughter called in stating pt is experiencing pelvic pain that  goes up to through the shoulder and into hand. She mentions hands can sometimes go numb as well. Pt has been experiencing the pain for a while but is getting worse. >> Jun 09, 2024 11:29 AM Mia F wrote: Pt daughter called in stating pt is experiencing pelvic pain that goes up to through the shoulder and into hand. She mentions hands can sometimes go numb as well. Pt has been experiencing the pain for a while but is getting worse.

## 2024-06-14 ENCOUNTER — Encounter: Payer: Self-pay | Admitting: Family Medicine

## 2024-06-14 ENCOUNTER — Ambulatory Visit: Admitting: Family Medicine

## 2024-06-14 VITALS — BP 110/76 | HR 80 | Temp 98.6°F | Ht 60.0 in | Wt 126.8 lb

## 2024-06-14 DIAGNOSIS — M51372 Other intervertebral disc degeneration, lumbosacral region with discogenic back pain and lower extremity pain: Secondary | ICD-10-CM | POA: Diagnosis not present

## 2024-06-14 DIAGNOSIS — M5412 Radiculopathy, cervical region: Secondary | ICD-10-CM | POA: Diagnosis not present

## 2024-06-14 DIAGNOSIS — M48062 Spinal stenosis, lumbar region with neurogenic claudication: Secondary | ICD-10-CM

## 2024-06-14 DIAGNOSIS — M5126 Other intervertebral disc displacement, lumbar region: Secondary | ICD-10-CM

## 2024-06-14 NOTE — Progress Notes (Addendum)
 Established Patient Office Visit   Subjective  Patient ID: Maria Garrison, female    DOB: Mar 02, 1966  Age: 58 y.o. MRN: [id number]  Chief Complaint  Patient presents with   Medical Management of Chronic Issues    Patient came in today for a lower back pain follow-up, left side is worst than the right side, patient rates the pain a 7 out of 10   Patient accompanied by her daughter.  [place] interpreter on phone for visit.  Pt is a 58 yo female seen for f/u on chronic back, neck, and shoulder pain.   Pt with h/o herniated disc, spinal stenosis, and lumbar radiculopathy.  Previously seen by Ortho/PM&R.  Surgery suggested, however pt would like to avoid.  Did PT and back injections, but pain returning with paresthesias, and weakness in b/l LEs.  Pain 7/10 in low back midline and on sides, L>R.  Also with pain in posterior occipital area at nuchal line with radiation into b/l UEs and paresthesias.  Numbness and tingling also in space between R 1st and 2nd digits.    Patient Active Problem List   Diagnosis Date Noted   Microscopic hematuria 07/19/2020   Gastroesophageal reflux disease 07/19/2020   Constipation 07/19/2020   Chronic generalized abdominal pain 07/19/2020   Spinal stenosis 12/19/2017   Pelvic pain in female 10/04/2015   Dyspareunia in female 10/04/2015   Encounter for routine gynecological examination 10/04/2015   Abdominal pain, chronic, right lower quadrant 07/27/2015   Past Medical History:  Diagnosis Date   Abdominal pain in female    For 3-4 years   COVID-19 10/11/2019   Heart murmur    Microscopic hematuria 2021   Spinal stenosis    Urinary incontinence    Past Surgical History:  Procedure Laterality Date   COLONOSCOPY     ESOPHAGOGASTRODUODENOSCOPY     HEMORROIDECTOMY     Social History   Tobacco Use   Smoking status: Never   Smokeless tobacco: Never  Vaping Use   Vaping status: Never Used  Substance Use Topics   Alcohol use: No   Drug use: No    Family History  Problem Relation Age of Onset   Other Mother        Lung Disease??   Cirrhosis Father    Colon cancer Neg Hx    Colon polyps Neg Hx    Esophageal cancer Neg Hx    Pancreatic cancer Neg Hx    Stomach cancer Neg Hx    No Known Allergies  ROS Negative unless stated above    Objective:     BP 110/76 (BP Location: Left Arm, Patient Position: Sitting, Cuff Size: Normal)   Pulse 80   Temp 98.6 F (37 C) (Oral)   Ht 5' (1.524 m)   Wt 126 lb 12.8 oz (57.5 kg)   LMP 12/19/2019 (Approximate)   SpO2 98%   BMI 24.76 kg/m  BP Readings from Last 3 Encounters:  06/14/24 110/76  05/17/24 124/72  04/27/24 110/80   Wt Readings from Last 3 Encounters:  06/14/24 126 lb 12.8 oz (57.5 kg)  05/17/24 126 lb 6.4 oz (57.3 kg)  04/27/24 129 lb 9.6 oz (58.8 kg)      Physical Exam Constitutional:      General: She is not in acute distress.    Appearance: Normal appearance.  HENT:     Head: Normocephalic and atraumatic.     Nose: Nose normal.     Mouth/Throat:     Mouth: Mucous  membranes are moist.  Cardiovascular:     Rate and Rhythm: Normal rate and regular rhythm.     Heart sounds: Normal heart sounds. No murmur heard.    No gallop.  Pulmonary:     Effort: Pulmonary effort is normal. No respiratory distress.     Breath sounds: Normal breath sounds. No wheezing, rhonchi or rales.  Musculoskeletal:     Thoracic back: Normal.       Back:     Comments: TTP of midline cervical,  lumbarscaral spine and b/l paraspinal muscles.  TTP of posterior of b/l LEs.  Skin:    General: Skin is warm and dry.  Neurological:     Mental Status: She is alert and oriented to person, place, and time.        06/14/2024   10:11 AM 06/19/2023    4:36 PM 01/22/2023    8:34 AM  Depression screen PHQ 2/9  Decreased Interest 2  1  Down, Depressed, Hopeless 1  0  PHQ - 2 Score 3  1  Altered sleeping 2 0 0  Tired, decreased energy 3 3 0  Change in appetite 3 1 0  Feeling bad or  failure about yourself  0 0 0  Trouble concentrating 0 0 0  Moving slowly or fidgety/restless 0 0 0  Suicidal thoughts 0 0 0  PHQ-9 Score 11  1  Difficult doing work/chores Somewhat difficult Not difficult at all Not difficult at all      06/14/2024   10:11 AM 06/19/2023    4:36 PM 01/22/2023    8:34 AM 03/13/2017   10:01 AM  GAD 7 : Generalized Anxiety Score  Nervous, Anxious, on Edge 0 0 0 3  Control/stop worrying 0 0 0 2  Worry too much - different things 0 0 0 2  Trouble relaxing 0 0 0 2  Restless 0 0 0 0  Easily annoyed or irritable 0 0 0 1  Afraid - awful might happen 0 0 0 1  Total GAD 7 Score 0 0 0 11  Anxiety Difficulty Not difficult at all Not difficult at all       No results found for any visits on 06/14/24.    Assessment & Plan:   Lumbar herniated disc -     Ambulatory referral to Orthopedic Surgery  Spinal stenosis of lumbar region with neurogenic claudication -     Ambulatory referral to Orthopedic Surgery  Degeneration of intervertebral disc of lumbosacral region with discogenic back pain and lower extremity pain -     Ambulatory referral to Orthopedic Surgery  Cervical radiculopathy -     Ambulatory referral to Orthopedic Surgery  Pt with acute on chronic back pain with radicular sx.   MRI lumbar spine 03/21/23 with L4-5 broad based disc bulge, severe spinal stenosis, mild-mod b/l foraminal stenosis. L5-S1 mild disc bulge.  L2-3 disc bulge.  X-ray cervical spine 01/08/23 with mild degenerative changes C5-C6, straightening of normal cervical lordosis which may be positional or due to muscle spasm.  Discussed scheduling f/u with Ortho to review treatment options.  Pt would like to avoid surgery.  Did PT in the past.  Injections were somewhat helpful.  New referral placed, contact pt's daughter Fausto to schedule.  Given precautions.  Pt given a note for work.  Return if symptoms worsen or fail to improve.   I personally spent a total of 38 minutes in the care  of the patient today including preparing to see the  patient, getting/reviewing separately obtained history, performing a medically appropriate exam/evaluation, counseling and educating, placing orders, referring and communicating with other health care professionals, documenting clinical information in the EHR, independently interpreting results, communicating results, and coordinating care.  Clotilda JONELLE Single, MD

## 2024-07-15 ENCOUNTER — Ambulatory Visit: Admitting: Physical Medicine and Rehabilitation

## 2024-07-15 ENCOUNTER — Encounter: Payer: Self-pay | Admitting: Physical Medicine and Rehabilitation

## 2024-07-15 DIAGNOSIS — M7918 Myalgia, other site: Secondary | ICD-10-CM | POA: Diagnosis not present

## 2024-07-15 DIAGNOSIS — G8929 Other chronic pain: Secondary | ICD-10-CM | POA: Diagnosis not present

## 2024-07-15 DIAGNOSIS — M5412 Radiculopathy, cervical region: Secondary | ICD-10-CM | POA: Diagnosis not present

## 2024-07-15 DIAGNOSIS — M542 Cervicalgia: Secondary | ICD-10-CM

## 2024-07-15 NOTE — Progress Notes (Signed)
 Maria Garrison - 58 y.o. female MRN [id number]  Date of birth: 1966-04-23  Office Visit Note: Visit Date: 07/15/2024 PCP: Mercer Clotilda SAUNDERS, MD Referred by: Mercer Clotilda SAUNDERS, MD  Subjective: Chief Complaint  Patient presents with   Neck - Pain   HPI: Maria Garrison is a 58 y.o. female who comes in today As a self referral for evaluation of chronic, worsening and severe bilateral neck pain radiating to shoulders and down both arms. Also reports paresthesias to arms/hand and pain radiating up to head. [place] interpreter at bedside. We have seen her in the past for more lower back issues. Symptoms ongoing for 2 years. Her pain becomes worse with movement and activity. She describes her pain as heaviness and aching, currently rates as 3 out of 10. Some relief of pain with home exercise regimen, rest and use of medications. No history of formal physical therapy for her neck. Cervical radiographs from 2024 shows straightening of normal lordosis, mild degenerative changes at C5-C6. No spondylolisthesis. No prior MRI imaging of cervical spine. 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 Patient denies focal weakness. No recent trauma or falls.      Review of Systems  Musculoskeletal:  Positive for myalgias and neck pain.  Neurological:  Positive for tingling. Negative for focal weakness and weakness.  All other systems reviewed and are negative.  Otherwise per HPI.  Assessment & Plan: Visit Diagnoses:    ICD-10-CM   1. Neck pain, chronic  M54.2 MR CERVICAL SPINE WO CONTRAST   G89.29 Ambulatory referral to Physical Therapy    2. Radiculopathy, cervical region  M54.12 MR CERVICAL SPINE WO CONTRAST    Ambulatory referral to Physical Therapy    3. Myofascial pain syndrome  M79.18 MR CERVICAL SPINE WO CONTRAST    Ambulatory referral to Physical Therapy       Plan: Findings:  Chronic, worsening and severe bilateral neck pain radiating to  shoulders and down arms. Paresthesias to bilateral arms/hands, intermittent pain that does radiate up to her head. Patient continues to have severe pain despite good conservative therapies such as home exercise regimen, rest and use of medications. Patients clinical presentation and exam are consistent with cervical radiculopathy. I also feel there is a myofascial component contributing to her pain as there is myofascial tenderness to bilateral levator scapulae and trapezius regions, pain does radiate up to occipital region. We discussed treatment plan in detail today. Next step is to place order for cervical MRI imaging. I also placed order for short course of formal physical therapy with a focus on manual treatments and dry needling. I will see her back for cervical MRI review. Her exam today is non focal, no myelopathic symptoms noted.     Meds & Orders: No orders of the defined types were placed in this encounter.   Orders Placed This Encounter  Procedures   MR CERVICAL SPINE WO CONTRAST   Ambulatory referral to Physical Therapy    Follow-up: Return for Cervical MRI review.   Procedures: No procedures performed      Clinical History: MRI LUMBAR SPINE WITHOUT CONTRAST   TECHNIQUE: Multiplanar, multisequence MR imaging of the lumbar spine was performed. No intravenous contrast was administered.   COMPARISON:  None Available.   FINDINGS: Segmentation:  Standard.   Alignment:  Physiologic.   Vertebrae: No acute fracture, evidence of discitis, or aggressive bone lesion.   Conus medullaris and cauda equina: Conus extends to the L1-2  level. Conus and cauda equina appear normal.   Paraspinal and other soft tissues: No acute paraspinal abnormality.   Disc levels:   Disc spaces: Disc desiccation at L2-3, L3-4, L4-5 and L5-S1 with minimal disc height loss at L5-S1.   T12-L1: No significant disc bulge. Mild bilateral facet arthropathy. No foraminal or central canal stenosis.    L1-L2: No significant disc bulge. Mild bilateral facet arthropathy. No foraminal or central canal stenosis.   L2-L3: Broad-based disc bulge with a broad shallow right paracentral disc protrusion. Mild bilateral facet arthropathy. No foraminal or central canal stenosis.   L3-L4: Mild broad-based disc bulge. Mild bilateral facet arthropathy. No foraminal or central canal stenosis.   L4-L5: Broad-based disc bulge with a broad central disc protrusion which may impinge upon bilateral intraspinal L5 nerve roots. Bilateral subarticular recess stenosis. Mild bilateral facet arthropathy. Severe spinal stenosis. Mild-moderate bilateral foraminal stenosis.   L5-S1: Mild broad-based disc bulge. Mild bilateral facet arthropathy. Mild-moderate bilateral foraminal stenosis. No spinal stenosis.   IMPRESSION: 1. At L4-5 there is a broad-based disc bulge with a broad central disc protrusion which may impinge upon bilateral intraspinal L5 nerve roots. Bilateral subarticular recess stenosis. Mild bilateral facet arthropathy. Severe spinal stenosis. Mild-moderate bilateral foraminal stenosis. 2. At L5-S1 there is a mild broad-based disc bulge. Mild bilateral facet arthropathy. Mild-moderate bilateral foraminal stenosis. 3. At L2-3 there is a broad-based disc bulge with a broad shallow right paracentral disc protrusion. Mild bilateral facet arthropathy. 4. No acute osseous injury of the lumbar spine.     Electronically Signed   By: Julaine Blanch M.D.   On: 03/30/2023 09:55   She reports that she has never smoked. She has never used smokeless tobacco. No results for input(s): HGBA1C, LABURIC in the last 8760 hours.  Objective:  VS:  HT:    WT:   BMI:     BP:   HR: bpm  TEMP: ( )  RESP:  Physical Exam Vitals and nursing note reviewed.  HENT:     Head: Normocephalic and atraumatic.     Right Ear: External ear normal.     Left Ear: External ear normal.     Nose: Nose normal.      Mouth/Throat:     Mouth: Mucous membranes are moist.  Eyes:     Extraocular Movements: Extraocular movements intact.  Cardiovascular:     Rate and Rhythm: Normal rate.     Pulses: Normal pulses.  Pulmonary:     Effort: Pulmonary effort is normal.  Abdominal:     General: Abdomen is flat. There is no distension.  Musculoskeletal:        General: Tenderness present.     Cervical back: Tenderness present.     Comments: Pain noted with flexion and extension. Patient has good strength in the upper extremities including 5 out of 5 strength in wrist extension, long finger flexion and APB. Shoulder range of motion is full bilaterally without any sign of impingement. There is no atrophy of the hands intrinsically. Sensation intact bilaterally. Myofascial tenderness noted to bilateral levator scapulae and trapezius regions upon palpation. Negative Hoffman's sign. Negative Spurling's sign.     Skin:    General: Skin is warm and dry.     Capillary Refill: Capillary refill takes less than 2 seconds.  Neurological:     General: No focal deficit present.     Mental Status: She is alert and oriented to person, place, and time.  Psychiatric:  Mood and Affect: Mood normal.        Behavior: Behavior normal.     Ortho Exam  Imaging: No results found.  Past Medical/Family/Surgical/Social History: Medications & Allergies reviewed per EMR, new medications updated. Patient Active Problem List   Diagnosis Date Noted   Microscopic hematuria 07/19/2020   Gastroesophageal reflux disease 07/19/2020   Constipation 07/19/2020   Chronic generalized abdominal pain 07/19/2020   Spinal stenosis 12/19/2017   Pelvic pain in female 10/04/2015   Dyspareunia in female 10/04/2015   Encounter for routine gynecological examination 10/04/2015   Abdominal pain, chronic, right lower quadrant 07/27/2015   Past Medical History:  Diagnosis Date   Abdominal pain in female    For 3-4 years   COVID-19 10/11/2019    Heart murmur    Microscopic hematuria 2021   Spinal stenosis    Urinary incontinence    Family History  Problem Relation Age of Onset   Other Mother        Lung Disease??   Cirrhosis Father    Colon cancer Neg Hx    Colon polyps Neg Hx    Esophageal cancer Neg Hx    Pancreatic cancer Neg Hx    Stomach cancer Neg Hx    Past Surgical History:  Procedure Laterality Date   COLONOSCOPY     ESOPHAGOGASTRODUODENOSCOPY     HEMORROIDECTOMY     Social History   Occupational History   Not on file  Tobacco Use   Smoking status: Never   Smokeless tobacco: Never  Vaping Use   Vaping status: Never Used  Substance and Sexual Activity   Alcohol use: No   Drug use: No   Sexual activity: Yes    Partners: Male    Birth control/protection: None

## 2024-07-15 NOTE — Progress Notes (Signed)
 Pain Scale   Average Pain 5 Patient advising he neck pain radiating bilateral shoulder pain        +Driver, -BT, -Dye Allergies.

## 2024-08-02 ENCOUNTER — Ambulatory Visit

## 2024-08-02 DIAGNOSIS — R2689 Other abnormalities of gait and mobility: Secondary | ICD-10-CM | POA: Diagnosis not present

## 2024-08-02 DIAGNOSIS — R29898 Other symptoms and signs involving the musculoskeletal system: Secondary | ICD-10-CM

## 2024-08-02 DIAGNOSIS — M542 Cervicalgia: Secondary | ICD-10-CM

## 2024-08-02 DIAGNOSIS — M5412 Radiculopathy, cervical region: Secondary | ICD-10-CM

## 2024-08-02 DIAGNOSIS — M6281 Muscle weakness (generalized): Secondary | ICD-10-CM

## 2024-08-02 DIAGNOSIS — R293 Abnormal posture: Secondary | ICD-10-CM

## 2024-08-02 NOTE — Therapy (Signed)
 OUTPATIENT PHYSICAL THERAPY CERVICAL EVALUATION   Patient Name: Maria Garrison MRN: [id number] DOB:04-Jul-1966, 58 y.o., female Today's Date: 08/02/2024  END OF SESSION:  PT End of Session - 08/02/24 0850     Visit Number 1    Number of Visits 6    Date for Recertification  09/13/24    Authorization Type BCBS AUTH NEEDED $70 COPAY    Progress Note Due on Visit 10    PT Start Time 0850    PT Stop Time 0935    PT Time Calculation (min) 45 min    Activity Tolerance Patient tolerated treatment well;Patient limited by pain    Behavior During Therapy [facility] for tasks assessed/performed          Past Medical History:  Diagnosis Date   Abdominal pain in female    For 3-4 years   COVID-19 10/11/2019   Heart murmur    Microscopic hematuria 2021   Spinal stenosis    Urinary incontinence    Past Surgical History:  Procedure Laterality Date   COLONOSCOPY     ESOPHAGOGASTRODUODENOSCOPY     HEMORROIDECTOMY     Patient Active Problem List   Diagnosis Date Noted   Microscopic hematuria 07/19/2020   Gastroesophageal reflux disease 07/19/2020   Constipation 07/19/2020   Chronic generalized abdominal pain 07/19/2020   Spinal stenosis 12/19/2017   Pelvic pain in female 10/04/2015   Dyspareunia in female 10/04/2015   Encounter for routine gynecological examination 10/04/2015   Abdominal pain, chronic, right lower quadrant 07/27/2015    PCP: Clotilda JONELLE Single, MD   REFERRING PROVIDER: Duwaine FORBES Pouch, NP  REFERRING DIAG: [phone] (ICD-10-CM) - Neck pain, chronic M54.12 (ICD-10-CM) - Radiculopathy, cervical region M79.18 (ICD-10-CM) - Myofascial pain syndrome  THERAPY DIAG:  Cervicalgia  Radiculopathy, cervical region  Muscle weakness (generalized)  Other abnormalities of gait and mobility  Other symptoms and signs involving the musculoskeletal system  Abnormal posture  Rationale for Evaluation and Treatment: Rehabilitation  ONSET DATE: chronic, worsened recently    SUBJECTIVE:                                                                                                                                                                                                         SUBJECTIVE STATEMENT: My shoulder pain, neck, and head.  Hand dominance: Right  PERTINENT HISTORY:  Patient endorsing chronic cervical pain that has recently worsened. Originally pain was worse on Lt compared to Rt, but is currently equally as painful bilaterally. Patient endorses no surgeries or injuries to the area or surrounding joints.  See PMH or personal factors for in depth comorbidities   PAIN:  Are you having pain? Yes: NPRS scale: 7/10 at rest, 10/10 at max  Pain location: neck, shoulders, n/t down both arms  Pain description: n/t, achy, dizzy from headache pain  Aggravating factors: turning head, sleeping, working Relieving factors: massage   PRECAUTIONS: None  RED FLAGS: Cervical red flags: Dysphagia No, Dysmetria No, Diplopia No, Nystagmus No, and Nausea No     WEIGHT BEARING RESTRICTIONS: No  FALLS:  Has patient fallen in last 6 months? No  LIVING ENVIRONMENT: Lives with: lives with their family Lives in: House/apartment Stairs: No Has following equipment at home: None  OCCUPATION: shipping in a warehouse   PLOF: Independent  PATIENT GOALS: decrease my pain   NEXT MD VISIT: not currently scheduled  OBJECTIVE:  Note: Objective measures were completed at Evaluation unless otherwise noted.  DIAGNOSTIC FINDINGS:    PATIENT SURVEYS:  PSFS: THE PATIENT SPECIFIC FUNCTIONAL SCALE  Place score of 0-10 (0 = unable to perform activity and 10 = able to perform activity at the same level as before injury or problem)  Activity Date: 08/02/2024    Household chores   3    2. Work duties   3    3.     4.      Total Score 3      Total Score = Sum of activity scores/number of activities  Minimally Detectable Change: 3 points (for single  activity); 2 points (for average score)  [employer] (nd). The Patient Specific Functional Scale . Retrieved from Skateoasis.com.pt   COGNITION: Overall cognitive status: Within functional limits for tasks assessed  SENSATION: Light touch: WFL  POSTURE: rounded shoulders and increased thoracic kyphosis  PALPATION: TTP in all cervical musculature, occipital region, upper traps, levator scaps, bilat shoulder musculature, and mid thoracic area    CERVICAL ROM: painful in all directions  ROM AROM (deg) Eval 08/02/2024  Flexion 35deg  Extension 40deg  Right lateral flexion 24deg  Left lateral flexion 30deg  Right rotation ~75% WFL  Left rotation ~75% WFL   (Blank rows = not tested)  UPPER EXTREMITY ROM:   ROM Right Eval 08/02/2024 Left Eval 08/02/2024  Shoulder flexion (seated) [facility]  Shoulder extension    Shoulder abduction (seated) [facility]   Shoulder adduction    Shoulder extension    Shoulder internal rotation (seated) T11 T11  Shoulder external rotation (seated) T1 T1  Elbow flexion    Elbow extension    Wrist flexion    Wrist extension    Wrist ulnar deviation    Wrist radial deviation    Wrist pronation    Wrist supination     (Blank rows = not tested)  UPPER EXTREMITY MMT:   MMT Right Eval 08/02/2024 Left Eval 08/02/2024  Shoulder flexion (seated) 4/5 , painful 5/5 , painful   Shoulder extension    Shoulder abduction (seated) 5/5 , painful 5/5, painful  Shoulder adduction    Shoulder extension    Shoulder internal rotation (seated) 5/5 5/5  Shoulder external rotation (seated) 5/5 5/5  Middle trapezius    Lower trapezius    Elbow flexion    Elbow extension    Wrist flexion    Wrist extension    Wrist ulnar deviation    Wrist radial deviation    Wrist pronation    Wrist supination    Grip strength     (Blank rows = not tested)  CERVICAL  SPECIAL TESTS:  Spurling's test:  Negative and Distraction test: Positive  Relief of symptoms with distraction    FUNCTIONAL TESTS:    TREATMENT DATE:  08/02/2024 TherEx:  HEP handout provided with patient performing one set of each activity for appropriate form. Verbal and tactile cues provided.  Self-Care:                                                                                                                               POC, neck pain and posture, dry needling    PATIENT EDUCATION:  Education details: HEP, dry needling, POC Person educated: Patient and Child(ren) Education method: Explanation, Demonstration, Tactile cues, Verbal cues, and Handouts Education comprehension: verbalized understanding, returned demonstration, verbal cues required, and tactile cues required  HOME EXERCISE PROGRAM: Access Code: [email or web address] URL: [email or web address] Date: 08/02/2024 Prepared by: Susannah Daring  Exercises - Seated Upper Trapezius Stretch  - 1 x daily - 7 x weekly - 2 sets - 30s hold - Seated Levator Scapulae Stretch  - 1 x daily - 7 x weekly - 2 sets - 30s hold - Cervical Extension AROM with Strap  - 1 x daily - 7 x weekly - 2 sets - 10 reps - 5s hold - Seated Cervical Retraction  - 1 x daily - 7 x weekly - 2 sets - 10 reps - 2-3s hold - Seated Scapular Retraction  - 1 x daily - 7 x weekly - 2 sets - 10 reps - 2-3s hold  ASSESSMENT:  CLINICAL IMPRESSION: Patient is a 58 y.o. F who was seen today for physical therapy evaluation and treatment for cervical radiculopathy and cervicalgia presenting with functional mobility deficits, abnormal posture, strength deficits, motor coordination deficits, and pain. Patient mainly limited secondary to pain and radicular symptoms bilaterally. Patient is currently waiting to be scheduled for a cervical MRI. Patient will benefit from skilled PT to address above noted deficits.    OBJECTIVE IMPAIRMENTS: decreased activity tolerance, decreased coordination,  decreased ROM, decreased strength, impaired flexibility, improper body mechanics, postural dysfunction, and pain.   ACTIVITY LIMITATIONS: carrying, lifting, sitting, sleeping, bed mobility, and caring for others  PARTICIPATION LIMITATIONS: meal prep, cleaning, laundry, driving, community activity, occupation, and yard work  PERSONAL FACTORS: Past/current experiences, Time since onset of injury/illness/exacerbation, and 1-2 comorbidities: heart murmur, spinal stenosis are also affecting patient's functional outcome.   REHAB POTENTIAL: Good  CLINICAL DECISION MAKING: Evolving/moderate complexity  EVALUATION COMPLEXITY: Moderate   GOALS: Goals reviewed with patient? Yes  SHORT TERM GOALS: Target date: 08/23/2024  Patient will show compliance with initial HEP. Baseline:  Goal status: INITIAL  2.  Patient will report pain levels no greater than 6/10 in order to show improved overall quality of life. Baseline:  Goal status: INITIAL   LONG TERM GOALS: Target date: 09/13/2024  Patient will be independent with final HEP in order to maintain and progress upon functional gains made within PT. Baseline:  Goal status: INITIAL  2.  Patient will report pain levels no greater than 4/10 in order to show improved overall quality of life. Baseline:  Goal status: INITIAL  3.  Patient will increase PSFS to at least 5 in order to show a significant improvement in subjective disability rating. Baseline:  Goal status: INITIAL  4.  Patient will increase cervical extension ROM to at least 50deg in order to improve functional mobility. Baseline:  Goal status: INITIAL  5.  Patient will increase bilat cervical lateral flexion to at least 50deg in order to improve functional mobility. Baseline:  Goal status: INITIAL    PLAN:  PT FREQUENCY: 1x/week  PT DURATION: 6 weeks  PLANNED INTERVENTIONS: 97164- PT Re-evaluation, 97750- Physical Performance Testing, 97110-Therapeutic exercises, 97530-  Therapeutic activity, [id number]- Neuromuscular re-education, 97535- Self Care, 02859- Manual therapy, [id number]- Gait training, [phone]- Orthotic Initial, [id number]- Orthotic/Prosthetic subsequent, [phone]- Canalith repositioning, [phone]- Aquatic Therapy, [phone]- Electrical stimulation (unattended), [phone]- Electrical stimulation (manual), [id number]- Vasopneumatic device, [id number]- Ultrasound, [id number]- Traction (mechanical), [id number]- Ionotophoresis 4mg /ml Dexamethasone, 79439 (1-2 muscles), [id number] (3+ muscles)- Dry Needling, Patient/Family education, Balance training, Stair training, Taping, Joint mobilization, Joint manipulation, Spinal manipulation, Spinal mobilization, Vestibular training, DME instructions, Cryotherapy, and Moist heat  PLAN FOR NEXT SESSION: neural tension assessment, review HEP, cervical mobility, occipital release/manual techniques    Susannah Daring, PT, DPT 08/02/24 2:05 PM

## 2024-08-09 ENCOUNTER — Encounter: Payer: Self-pay | Admitting: Radiology

## 2024-08-11 NOTE — Therapy (Addendum)
 " OUTPATIENT PHYSICAL THERAPY CERVICAL TREATMENT / DISCHARGE   Patient Name: Maria Garrison MRN: [id number] DOB:June 20, 1966, 58 y.o., female Today's Date: 08/12/2024  END OF SESSION:  PT End of Session - 08/12/24 0848     Visit Number 2    Number of Visits 6    Date for Recertification  09/13/24    Authorization Type BCBS AUTH NEEDED $70 COPAY    Authorization - Visit Number 2    Authorization - Number of Visits 4    Progress Note Due on Visit 10    PT Start Time 0848    PT Stop Time 0926    PT Time Calculation (min) 38 min    Activity Tolerance Patient tolerated treatment well;Patient limited by pain    Behavior During Therapy [facility] for tasks assessed/performed           Past Medical History:  Diagnosis Date   Abdominal pain in female    For 3-4 years   COVID-19 10/11/2019   Heart murmur    Microscopic hematuria 2021   Spinal stenosis    Urinary incontinence    Past Surgical History:  Procedure Laterality Date   COLONOSCOPY     ESOPHAGOGASTRODUODENOSCOPY     HEMORROIDECTOMY     Patient Active Problem List   Diagnosis Date Noted   Microscopic hematuria 07/19/2020   Gastroesophageal reflux disease 07/19/2020   Constipation 07/19/2020   Chronic generalized abdominal pain 07/19/2020   Spinal stenosis 12/19/2017   Pelvic pain in female 10/04/2015   Dyspareunia in female 10/04/2015   Encounter for routine gynecological examination 10/04/2015   Abdominal pain, chronic, right lower quadrant 07/27/2015    PCP: Clotilda JONELLE Single, MD   REFERRING PROVIDER: Duwaine FORBES Pouch, NP  REFERRING DIAG: [phone] (ICD-10-CM) - Neck pain, chronic M54.12 (ICD-10-CM) - Radiculopathy, cervical region M79.18 (ICD-10-CM) - Myofascial pain syndrome  THERAPY DIAG:  Cervicalgia  Radiculopathy, cervical region  Muscle weakness (generalized)  Other abnormalities of gait and mobility  Other symptoms and signs involving the musculoskeletal system  Abnormal posture  Rationale for  Evaluation and Treatment: Rehabilitation  ONSET DATE: chronic, worsened recently   SUBJECTIVE:                                                                                                                                                                                                         SUBJECTIVE STATEMENT: Patient endorses no improvement in symptoms and Lt shoulder is hurting.  Hand dominance: Right  PERTINENT HISTORY:  Patient endorsing chronic cervical pain that has recently worsened. Originally pain  was worse on Lt compared to Rt, but is currently equally as painful bilaterally. Patient endorses no surgeries or injuries to the area or surrounding joints.  See PMH or personal factors for in depth comorbidities   PAIN:  Are you having pain? Yes: NPRS scale: 7/10 this morning  Pain location: neck, shoulders, n/t down both arms  Pain description: n/t, achy, dizzy from headache pain  Aggravating factors: turning head, sleeping, working Relieving factors: massage   PRECAUTIONS: None  RED FLAGS: Cervical red flags: Dysphagia No, Dysmetria No, Diplopia No, Nystagmus No, and Nausea No     WEIGHT BEARING RESTRICTIONS: No  FALLS:  Has patient fallen in last 6 months? No  LIVING ENVIRONMENT: Lives with: lives with their family Lives in: House/apartment Stairs: No Has following equipment at home: None  OCCUPATION: shipping in a warehouse   PLOF: Independent  PATIENT GOALS: decrease my pain   NEXT MD VISIT: not currently scheduled  OBJECTIVE:  Note: Objective measures were completed at Evaluation unless otherwise noted.  DIAGNOSTIC FINDINGS:    PATIENT SURVEYS:  PSFS: THE PATIENT SPECIFIC FUNCTIONAL SCALE  Place score of 0-10 (0 = unable to perform activity and 10 = able to perform activity at the same level as before injury or problem)  Activity Date: 08/02/2024    Household chores   3    2. Work duties   3    3.     4.      Total Score 3      Total  Score = Sum of activity scores/number of activities  Minimally Detectable Change: 3 points (for single activity); 2 points (for average score)  [employer] (nd). The Patient Specific Functional Scale . Retrieved from Skateoasis.com.pt   COGNITION: Overall cognitive status: Within functional limits for tasks assessed  SENSATION: Light touch: WFL  POSTURE: rounded shoulders and increased thoracic kyphosis  PALPATION: TTP in all cervical musculature, occipital region, upper traps, levator scaps, bilat shoulder musculature, and mid thoracic area    CERVICAL ROM: painful in all directions  ROM AROM (deg) Eval 08/02/2024  Flexion 35deg  Extension 40deg  Right lateral flexion 24deg  Left lateral flexion 30deg  Right rotation ~75% WFL  Left rotation ~75% WFL   (Blank rows = not tested)  UPPER EXTREMITY ROM:   ROM Right Eval 08/02/2024 Left Eval 08/02/2024  Shoulder flexion (seated) [facility]  Shoulder extension    Shoulder abduction (seated) [facility]   Shoulder adduction    Shoulder extension    Shoulder internal rotation (seated) T11 T11  Shoulder external rotation (seated) T1 T1  Elbow flexion    Elbow extension    Wrist flexion    Wrist extension    Wrist ulnar deviation    Wrist radial deviation    Wrist pronation    Wrist supination     (Blank rows = not tested)  UPPER EXTREMITY MMT:   MMT Right Eval 08/02/2024 Left Eval 08/02/2024  Shoulder flexion (seated) 4/5 , painful 5/5 , painful   Shoulder extension    Shoulder abduction (seated) 5/5 , painful 5/5, painful  Shoulder adduction    Shoulder extension    Shoulder internal rotation (seated) 5/5 5/5  Shoulder external rotation (seated) 5/5 5/5  Middle trapezius    Lower trapezius    Elbow flexion    Elbow extension    Wrist flexion    Wrist extension    Wrist ulnar deviation    Wrist radial deviation  Wrist pronation    Wrist  supination    Grip strength     (Blank rows = not tested)  CERVICAL SPECIAL TESTS:  Spurling's test: Negative and Distraction test: Positive  Relief of symptoms with distraction    Neural Tension Testing 08/12/2024 Ulnar:  Medial:  Radial:    FUNCTIONAL TESTS:    TREATMENT DATE:  08/12/2024 Neuro Re-Ed:  UBE with bilat UE 3 min fwd/3 min back  Standing rows with green TB 1x12, blue TB 1x12  Standing straight arm pulldowns with green TB 2x12   TherEx:  Seated ER with scap squeeze using red TB 2x10  Supine chest press with scap punch at top 2x10 with 3# Dbs UT stretch following manual 2x30s each side   Manual:  STM to UT, levator scap, SCM and then performed suboccipital release   08/02/2024 TherEx:  HEP handout provided with patient performing one set of each activity for appropriate form. Verbal and tactile cues provided.  Self-Care:                                                                                                                               POC, neck pain and posture, dry needling    PATIENT EDUCATION:  Education details: HEP, dry needling, POC Person educated: Patient and Child(ren) Education method: Explanation, Demonstration, Tactile cues, Verbal cues, and Handouts Education comprehension: verbalized understanding, returned demonstration, verbal cues required, and tactile cues required  HOME EXERCISE PROGRAM: Access Code: [email or web address] URL: [email or web address] Date: 08/02/2024 Prepared by: Susannah Daring  Exercises - Seated Upper Trapezius Stretch  - 1 x daily - 7 x weekly - 2 sets - 30s hold - Seated Levator Scapulae Stretch  - 1 x daily - 7 x weekly - 2 sets - 30s hold - Cervical Extension AROM with Strap  - 1 x daily - 7 x weekly - 2 sets - 10 reps - 5s hold - Seated Cervical Retraction  - 1 x daily - 7 x weekly - 2 sets - 10 reps - 2-3s hold - Seated Scapular Retraction  - 1 x daily - 7 x weekly - 2 sets - 10 reps - 2-3s  hold  ASSESSMENT:  CLINICAL IMPRESSION: Patient arrived to session noting no change in symptoms even though she has been compliant with HEP daily. Patient tolerated all activities this date with no increase in pain and responded well to manual. Patient will continue to benefit from skilled PT.   OBJECTIVE IMPAIRMENTS: decreased activity tolerance, decreased coordination, decreased ROM, decreased strength, impaired flexibility, improper body mechanics, postural dysfunction, and pain.   ACTIVITY LIMITATIONS: carrying, lifting, sitting, sleeping, bed mobility, and caring for others  PARTICIPATION LIMITATIONS: meal prep, cleaning, laundry, driving, community activity, occupation, and yard work  PERSONAL FACTORS: Past/current experiences, Time since onset of injury/illness/exacerbation, and 1-2 comorbidities: heart murmur, spinal stenosis are also affecting patient's functional outcome.   REHAB POTENTIAL: Good  CLINICAL DECISION MAKING: Evolving/moderate complexity  EVALUATION COMPLEXITY: Moderate   GOALS: Goals reviewed with patient? Yes  SHORT TERM GOALS: Target date: 08/23/2024  Patient will show compliance with initial HEP. Baseline:  Goal status: INITIAL  2.  Patient will report pain levels no greater than 6/10 in order to show improved overall quality of life. Baseline:  Goal status: INITIAL   LONG TERM GOALS: Target date: 09/13/2024  Patient will be independent with final HEP in order to maintain and progress upon functional gains made within PT. Baseline:  Goal status: INITIAL  2.  Patient will report pain levels no greater than 4/10 in order to show improved overall quality of life. Baseline:  Goal status: INITIAL  3.  Patient will increase PSFS to at least 5 in order to show a significant improvement in subjective disability rating. Baseline:  Goal status: INITIAL  4.  Patient will increase cervical extension ROM to at least 50deg in order to improve functional  mobility. Baseline:  Goal status: INITIAL  5.  Patient will increase bilat cervical lateral flexion to at least 50deg in order to improve functional mobility. Baseline:  Goal status: INITIAL    PLAN:  PT FREQUENCY: 1x/week  PT DURATION: 6 weeks  PLANNED INTERVENTIONS: 97164- PT Re-evaluation, 97750- Physical Performance Testing, 97110-Therapeutic exercises, 97530- Therapeutic activity, [id number]- Neuromuscular re-education, 97535- Self Care, 02859- Manual therapy, [id number]- Gait training, [phone]- Orthotic Initial, [phone]- Orthotic/Prosthetic subsequent, [phone]- Canalith repositioning, [phone]- Aquatic Therapy, [phone]- Electrical stimulation (unattended), [phone]- Electrical stimulation (manual), [id number]- Vasopneumatic device, [id number]- Ultrasound, [id number]- Traction (mechanical), [id number]- Ionotophoresis 4mg /ml Dexamethasone, 79439 (1-2 muscles), [id number] (3+ muscles)- Dry Needling, Patient/Family education, Balance training, Stair training, Taping, Joint mobilization, Joint manipulation, Spinal manipulation, Spinal mobilization, Vestibular training, DME instructions, Cryotherapy, and Moist heat  PLAN FOR NEXT SESSION:  neural tension assessment, cervical mobility, occipital release/manual techniques   PHYSICAL THERAPY DISCHARGE SUMMARY  Visits from Start of Care: 2  Current functional level related to goals / functional outcomes: See above    Remaining deficits: See above    Education / Equipment: HEP   Patient agrees to discharge. Patient goals were not met. Patient is being discharged due to not returning since the last visit.   Susannah Daring, PT, DPT 08/12/24 10:04 AM        "

## 2024-08-12 ENCOUNTER — Ambulatory Visit (INDEPENDENT_AMBULATORY_CARE_PROVIDER_SITE_OTHER): Payer: Self-pay

## 2024-08-12 DIAGNOSIS — R2689 Other abnormalities of gait and mobility: Secondary | ICD-10-CM

## 2024-08-12 DIAGNOSIS — M6281 Muscle weakness (generalized): Secondary | ICD-10-CM

## 2024-08-12 DIAGNOSIS — R293 Abnormal posture: Secondary | ICD-10-CM

## 2024-08-12 DIAGNOSIS — R29898 Other symptoms and signs involving the musculoskeletal system: Secondary | ICD-10-CM

## 2024-08-12 DIAGNOSIS — M5412 Radiculopathy, cervical region: Secondary | ICD-10-CM

## 2024-08-12 DIAGNOSIS — M542 Cervicalgia: Secondary | ICD-10-CM

## 2024-08-18 ENCOUNTER — Ambulatory Visit: Payer: Self-pay | Admitting: Family Medicine

## 2024-08-19 ENCOUNTER — Encounter

## 2024-08-23 NOTE — Therapy (Incomplete)
 OUTPATIENT PHYSICAL THERAPY CERVICAL TREATMENT   Patient Name: Maria Garrison MRN: [id number] DOB:April 28, 1966, 58 y.o., female Today's Date: 08/23/2024  END OF SESSION:     Past Medical History:  Diagnosis Date   Abdominal pain in female    For 3-4 years   COVID-19 10/11/2019   Heart murmur    Microscopic hematuria 2021   Spinal stenosis    Urinary incontinence    Past Surgical History:  Procedure Laterality Date   COLONOSCOPY     ESOPHAGOGASTRODUODENOSCOPY     HEMORROIDECTOMY     Patient Active Problem List   Diagnosis Date Noted   Microscopic hematuria 07/19/2020   Gastroesophageal reflux disease 07/19/2020   Constipation 07/19/2020   Chronic generalized abdominal pain 07/19/2020   Spinal stenosis 12/19/2017   Pelvic pain in female 10/04/2015   Dyspareunia in female 10/04/2015   Encounter for routine gynecological examination 10/04/2015   Abdominal pain, chronic, right lower quadrant 07/27/2015    PCP: Clotilda JONELLE Single, MD   REFERRING PROVIDER: Duwaine FORBES Pouch, NP  REFERRING DIAG: [phone] (ICD-10-CM) - Neck pain, chronic M54.12 (ICD-10-CM) - Radiculopathy, cervical region M79.18 (ICD-10-CM) - Myofascial pain syndrome  THERAPY DIAG:  No diagnosis found.  Rationale for Evaluation and Treatment: Rehabilitation  ONSET DATE: chronic, worsened recently   SUBJECTIVE:                                                                                                                                                                                                         SUBJECTIVE STATEMENT: ***Patient endorses no improvement in symptoms and Lt shoulder is hurting.  Hand dominance: Right  PERTINENT HISTORY:  Patient endorsing chronic cervical pain that has recently worsened. Originally pain was worse on Lt compared to Rt, but is currently equally as painful bilaterally. Patient endorses no surgeries or injuries to the area or surrounding joints.  See PMH or  personal factors for in depth comorbidities   PAIN:  ***Are you having pain? Yes: NPRS scale: 7/10 this morning  Pain location: neck, shoulders, n/t down both arms  Pain description: n/t, achy, dizzy from headache pain  Aggravating factors: turning head, sleeping, working Relieving factors: massage   PRECAUTIONS: None  RED FLAGS: Cervical red flags: Dysphagia No, Dysmetria No, Diplopia No, Nystagmus No, and Nausea No     WEIGHT BEARING RESTRICTIONS: No  FALLS:  Has patient fallen in last 6 months? No  LIVING ENVIRONMENT: Lives with: lives with their family Lives in: House/apartment Stairs: No Has following equipment at home: None  OCCUPATION: shipping in a warehouse  PLOF: Independent  PATIENT GOALS: decrease my pain   NEXT MD VISIT: not currently scheduled  OBJECTIVE:  Note: Objective measures were completed at Evaluation unless otherwise noted.  DIAGNOSTIC FINDINGS:    PATIENT SURVEYS:  PSFS: THE PATIENT SPECIFIC FUNCTIONAL SCALE  Place score of 0-10 (0 = unable to perform activity and 10 = able to perform activity at the same level as before injury or problem)  Activity Date: 08/02/2024    Household chores   3    2. Work duties   3    3.     4.      Total Score 3      Total Score = Sum of activity scores/number of activities  Minimally Detectable Change: 3 points (for single activity); 2 points (for average score)  [employer] (nd). The Patient Specific Functional Scale . Retrieved from [email or web address]   COGNITION: Overall cognitive status: Within functional limits for tasks assessed  SENSATION: Light touch: WFL  POSTURE: rounded shoulders and increased thoracic kyphosis  PALPATION: TTP in all cervical musculature, occipital region, upper traps, levator scaps, bilat shoulder musculature, and mid thoracic area    CERVICAL ROM: painful in all directions  ROM AROM  (deg) Eval 08/02/2024  Flexion 35deg  Extension 40deg  Right lateral flexion 24deg  Left lateral flexion 30deg  Right rotation ~75% WFL  Left rotation ~75% WFL   (Blank rows = not tested)  UPPER EXTREMITY ROM:   ROM Right Eval 08/02/2024 Left Eval 08/02/2024  Shoulder flexion (seated) [facility]  Shoulder extension    Shoulder abduction (seated) [facility]   Shoulder adduction    Shoulder extension    Shoulder internal rotation (seated) T11 T11  Shoulder external rotation (seated) T1 T1  Elbow flexion    Elbow extension    Wrist flexion    Wrist extension    Wrist ulnar deviation    Wrist radial deviation    Wrist pronation    Wrist supination     (Blank rows = not tested)  UPPER EXTREMITY MMT:   MMT Right Eval 08/02/2024 Left Eval 08/02/2024  Shoulder flexion (seated) 4/5 , painful 5/5 , painful   Shoulder extension    Shoulder abduction (seated) 5/5 , painful 5/5, painful  Shoulder adduction    Shoulder extension    Shoulder internal rotation (seated) 5/5 5/5  Shoulder external rotation (seated) 5/5 5/5  Middle trapezius    Lower trapezius    Elbow flexion    Elbow extension    Wrist flexion    Wrist extension    Wrist ulnar deviation    Wrist radial deviation    Wrist pronation    Wrist supination    Grip strength     (Blank rows = not tested)  CERVICAL SPECIAL TESTS:  Spurling's test: Negative and Distraction test: Positive  Relief of symptoms with distraction    Neural Tension Testing 08/12/2024 Ulnar:  Medial:  Radial:    FUNCTIONAL TESTS:    TREATMENT DATE:  08/24/24***     08/12/2024 Neuro Re-Ed:  UBE with bilat UE 3 min fwd/3 min back  Standing rows with green TB 1x12, blue TB 1x12  Standing straight arm pulldowns with green TB 2x12   TherEx:  Seated ER with scap squeeze using red TB 2x10  Supine chest press with scap punch at top 2x10 with 3# Dbs UT stretch following manual 2x30s each side   Manual:  STM to UT, levator  scap, SCM and then  performed suboccipital release   08/02/2024 TherEx:  HEP handout provided with patient performing one set of each activity for appropriate form. Verbal and tactile cues provided.  Self-Care:                                                                                                                               POC, neck pain and posture, dry needling    PATIENT EDUCATION:  Education details: HEP, dry needling, POC Person educated: Patient and Child(ren) Education method: Explanation, Demonstration, Tactile cues, Verbal cues, and Handouts Education comprehension: verbalized understanding, returned demonstration, verbal cues required, and tactile cues required  HOME EXERCISE PROGRAM: Access Code: [email or web address] URL: [email or web address] Date: 08/02/2024 Prepared by: Susannah Daring  Exercises - Seated Upper Trapezius Stretch  - 1 x daily - 7 x weekly - 2 sets - 30s hold - Seated Levator Scapulae Stretch  - 1 x daily - 7 x weekly - 2 sets - 30s hold - Cervical Extension AROM with Strap  - 1 x daily - 7 x weekly - 2 sets - 10 reps - 5s hold - Seated Cervical Retraction  - 1 x daily - 7 x weekly - 2 sets - 10 reps - 2-3s hold - Seated Scapular Retraction  - 1 x daily - 7 x weekly - 2 sets - 10 reps - 2-3s hold  ASSESSMENT:  CLINICAL IMPRESSION: ****Patient arrived to session noting no change in symptoms even though she has been compliant with HEP daily. Patient tolerated all activities this date with no increase in pain and responded well to manual. Patient will continue to benefit from skilled PT.   OBJECTIVE IMPAIRMENTS: decreased activity tolerance, decreased coordination, decreased ROM, decreased strength, impaired flexibility, improper body mechanics, postural dysfunction, and pain.   ACTIVITY LIMITATIONS: carrying, lifting, sitting, sleeping, bed mobility, and caring for others  PARTICIPATION LIMITATIONS: meal prep, cleaning, laundry, driving,  community activity, occupation, and yard work  PERSONAL FACTORS: Past/current experiences, Time since onset of injury/illness/exacerbation, and 1-2 comorbidities: heart murmur, spinal stenosis are also affecting patient's functional outcome.   REHAB POTENTIAL: Good  CLINICAL DECISION MAKING: Evolving/moderate complexity  EVALUATION COMPLEXITY: Moderate   GOALS: Goals reviewed with patient? Yes  SHORT TERM GOALS: Target date: 08/23/2024***  Patient will show compliance with initial HEP. Baseline:  Goal status: INITIAL  2.  Patient will report pain levels no greater than 6/10 in order to show improved overall quality of life. Baseline:  Goal status: INITIAL   LONG TERM GOALS: Target date: 09/13/2024  Patient will be independent with final HEP in order to maintain and progress upon functional gains made within PT. Baseline:  Goal status: INITIAL  2.  Patient will report pain levels no greater than 4/10 in order to show improved overall quality of life. Baseline:  Goal status: INITIAL  3.  Patient will increase PSFS to at least 5 in order to show a significant improvement in subjective disability rating. Baseline:  Goal status: INITIAL  4.  Patient will increase cervical extension ROM to at least 50deg in order to improve functional mobility. Baseline:  Goal status: INITIAL  5.  Patient will increase bilat cervical lateral flexion to at least 50deg in order to improve functional mobility. Baseline:  Goal status: INITIAL    PLAN:  PT FREQUENCY: 1x/week  PT DURATION: 6 weeks  PLANNED INTERVENTIONS: [id number]- PT Re-evaluation, 97750- Physical Performance Testing, 97110-Therapeutic exercises, 97530- Therapeutic activity, [id number]- Neuromuscular re-education, 97535- Self Care, [id number]- Manual therapy, [id number]- Gait training, [phone]- Orthotic Initial, [phone]- Orthotic/Prosthetic subsequent, [phone]- Canalith repositioning, [phone]- Aquatic Therapy, [phone]- Electrical stimulation  (unattended), [phone]- Electrical stimulation (manual), [id number]- Vasopneumatic device, [id number]- Ultrasound, [id number]- Traction (mechanical), [id number]- Ionotophoresis 4mg /ml Dexamethasone, 79439 (1-2 muscles), [id number] (3+ muscles)- Dry Needling, Patient/Family education, Balance training, Stair training, Taping, Joint mobilization, Joint manipulation, Spinal manipulation, Spinal mobilization, Vestibular training, DME instructions, Cryotherapy, and Moist heat  PLAN FOR NEXT SESSION: *** neural tension assessment, cervical mobility, occipital release/manual techniques   Burnard Meth, PT 08/23/24  7:55 AM

## 2024-08-24 ENCOUNTER — Encounter

## 2024-08-31 ENCOUNTER — Encounter
